# Patient Record
Sex: Male | Born: 1935 | Race: White | Hispanic: No | Marital: Married | Smoking: Former smoker
Health system: Southern US, Community
[De-identification: ages and names within clinical notes are randomized; demographics above are authoritative.]

## PROBLEM LIST (undated history)

## (undated) DIAGNOSIS — I739 Peripheral vascular disease, unspecified: Secondary | ICD-10-CM

## (undated) DIAGNOSIS — I495 Sick sinus syndrome: Secondary | ICD-10-CM

## (undated) DIAGNOSIS — I10 Essential (primary) hypertension: Secondary | ICD-10-CM

## (undated) DIAGNOSIS — I459 Conduction disorder, unspecified: Secondary | ICD-10-CM

## (undated) DIAGNOSIS — J9690 Respiratory failure, unspecified, unspecified whether with hypoxia or hypercapnia: Secondary | ICD-10-CM

## (undated) DIAGNOSIS — I482 Chronic atrial fibrillation, unspecified: Secondary | ICD-10-CM

## (undated) DIAGNOSIS — K279 Peptic ulcer, site unspecified, unspecified as acute or chronic, without hemorrhage or perforation: Secondary | ICD-10-CM

## (undated) DIAGNOSIS — N189 Chronic kidney disease, unspecified: Secondary | ICD-10-CM

## (undated) DIAGNOSIS — I2510 Atherosclerotic heart disease of native coronary artery without angina pectoris: Secondary | ICD-10-CM

## (undated) DIAGNOSIS — M1990 Unspecified osteoarthritis, unspecified site: Secondary | ICD-10-CM

## (undated) DIAGNOSIS — E785 Hyperlipidemia, unspecified: Secondary | ICD-10-CM

## (undated) HISTORY — PX: INSERT / REPLACE / REMOVE PACEMAKER: SUR710

## (undated) HISTORY — DX: Essential (primary) hypertension: I10

## (undated) HISTORY — DX: Chronic kidney disease, unspecified: N18.9

## (undated) HISTORY — DX: Peripheral vascular disease, unspecified: I73.9

## (undated) HISTORY — DX: Conduction disorder, unspecified: I45.9

## (undated) HISTORY — DX: Sick sinus syndrome: I49.5

## (undated) HISTORY — DX: Atherosclerotic heart disease of native coronary artery without angina pectoris: I25.10

## (undated) HISTORY — DX: Respiratory failure, unspecified, unspecified whether with hypoxia or hypercapnia: J96.90

## (undated) HISTORY — DX: Hyperlipidemia, unspecified: E78.5

## (undated) HISTORY — PX: CHOLECYSTECTOMY: SHX55

## (undated) HISTORY — DX: Unspecified osteoarthritis, unspecified site: M19.90

## (undated) HISTORY — DX: Chronic atrial fibrillation, unspecified: I48.20

## (undated) HISTORY — DX: Peptic ulcer, site unspecified, unspecified as acute or chronic, without hemorrhage or perforation: K27.9

---

## 2002-07-25 ENCOUNTER — Emergency Department (HOSPITAL_COMMUNITY): Admission: EM | Admit: 2002-07-25 | Discharge: 2002-07-25 | Payer: Self-pay | Admitting: Internal Medicine

## 2002-07-25 ENCOUNTER — Encounter: Payer: Self-pay | Admitting: Internal Medicine

## 2002-08-01 ENCOUNTER — Encounter: Payer: Self-pay | Admitting: Internal Medicine

## 2002-08-01 ENCOUNTER — Ambulatory Visit (HOSPITAL_COMMUNITY): Admission: RE | Admit: 2002-08-01 | Discharge: 2002-08-01 | Payer: Self-pay | Admitting: Internal Medicine

## 2002-08-03 ENCOUNTER — Inpatient Hospital Stay (HOSPITAL_COMMUNITY): Admission: EM | Admit: 2002-08-03 | Discharge: 2002-08-06 | Payer: Self-pay | Admitting: Internal Medicine

## 2002-08-03 ENCOUNTER — Encounter: Payer: Self-pay | Admitting: Internal Medicine

## 2002-08-03 ENCOUNTER — Encounter: Payer: Self-pay | Admitting: *Deleted

## 2002-08-04 ENCOUNTER — Encounter: Payer: Self-pay | Admitting: Internal Medicine

## 2002-09-03 ENCOUNTER — Observation Stay (HOSPITAL_COMMUNITY): Admission: RE | Admit: 2002-09-03 | Discharge: 2002-09-04 | Payer: Self-pay | Admitting: General Surgery

## 2002-09-03 ENCOUNTER — Encounter: Payer: Self-pay | Admitting: General Surgery

## 2003-05-26 ENCOUNTER — Ambulatory Visit: Admission: RE | Admit: 2003-05-26 | Discharge: 2003-05-26 | Payer: Self-pay | Admitting: Orthopedic Surgery

## 2003-05-26 ENCOUNTER — Encounter: Payer: Self-pay | Admitting: Orthopedic Surgery

## 2003-11-18 ENCOUNTER — Ambulatory Visit (HOSPITAL_COMMUNITY): Admission: RE | Admit: 2003-11-18 | Discharge: 2003-11-18 | Payer: Self-pay | Admitting: Family Medicine

## 2004-06-10 ENCOUNTER — Inpatient Hospital Stay (HOSPITAL_COMMUNITY): Admission: EM | Admit: 2004-06-10 | Discharge: 2004-06-18 | Payer: Self-pay | Admitting: Emergency Medicine

## 2004-06-15 ENCOUNTER — Encounter: Payer: Self-pay | Admitting: Cardiology

## 2004-06-28 ENCOUNTER — Ambulatory Visit (HOSPITAL_COMMUNITY): Admission: RE | Admit: 2004-06-28 | Discharge: 2004-06-28 | Payer: Self-pay | Admitting: Internal Medicine

## 2004-07-20 ENCOUNTER — Ambulatory Visit (HOSPITAL_COMMUNITY): Admission: RE | Admit: 2004-07-20 | Discharge: 2004-07-20 | Payer: Self-pay | Admitting: Internal Medicine

## 2004-09-02 ENCOUNTER — Ambulatory Visit: Payer: Self-pay | Admitting: Cardiology

## 2004-12-10 ENCOUNTER — Ambulatory Visit: Payer: Self-pay | Admitting: Cardiology

## 2005-06-03 ENCOUNTER — Ambulatory Visit: Payer: Self-pay | Admitting: Cardiology

## 2005-12-27 ENCOUNTER — Ambulatory Visit: Payer: Self-pay | Admitting: Cardiology

## 2006-04-13 ENCOUNTER — Ambulatory Visit (HOSPITAL_COMMUNITY): Admission: RE | Admit: 2006-04-13 | Discharge: 2006-04-13 | Payer: Self-pay | Admitting: Internal Medicine

## 2006-06-19 ENCOUNTER — Ambulatory Visit: Payer: Self-pay | Admitting: Cardiology

## 2007-06-16 ENCOUNTER — Emergency Department (HOSPITAL_COMMUNITY): Admission: EM | Admit: 2007-06-16 | Discharge: 2007-06-16 | Payer: Self-pay | Admitting: Emergency Medicine

## 2007-06-19 ENCOUNTER — Ambulatory Visit: Payer: Self-pay | Admitting: Cardiology

## 2007-06-19 LAB — CONVERTED CEMR LAB
INR: 1.2 — ABNORMAL HIGH (ref 0.8–1.0)
Prothrombin Time: 13.3 s (ref 10.9–13.3)

## 2007-06-25 ENCOUNTER — Encounter: Payer: Self-pay | Admitting: Cardiology

## 2007-06-25 ENCOUNTER — Ambulatory Visit: Payer: Self-pay

## 2007-07-19 ENCOUNTER — Ambulatory Visit: Payer: Self-pay | Admitting: Emergency Medicine

## 2007-07-19 ENCOUNTER — Observation Stay (HOSPITAL_COMMUNITY): Admission: EM | Admit: 2007-07-19 | Discharge: 2007-07-20 | Payer: Self-pay | Admitting: Emergency Medicine

## 2007-07-19 ENCOUNTER — Ambulatory Visit: Payer: Self-pay | Admitting: Internal Medicine

## 2007-07-31 ENCOUNTER — Ambulatory Visit: Payer: Self-pay | Admitting: Cardiology

## 2007-07-31 LAB — CONVERTED CEMR LAB
BUN: 36 mg/dL — ABNORMAL HIGH (ref 6–23)
Calcium: 9.7 mg/dL (ref 8.4–10.5)
GFR calc Af Amer: 51 mL/min
GFR calc non Af Amer: 42 mL/min
Glucose, Bld: 316 mg/dL — ABNORMAL HIGH (ref 70–99)
Potassium: 5 meq/L (ref 3.5–5.1)

## 2007-08-21 ENCOUNTER — Ambulatory Visit: Payer: Self-pay | Admitting: Emergency Medicine

## 2007-09-18 ENCOUNTER — Ambulatory Visit: Payer: Self-pay | Admitting: Cardiology

## 2007-10-04 DIAGNOSIS — I2510 Atherosclerotic heart disease of native coronary artery without angina pectoris: Secondary | ICD-10-CM

## 2007-10-04 HISTORY — DX: Atherosclerotic heart disease of native coronary artery without angina pectoris: I25.10

## 2007-12-20 ENCOUNTER — Ambulatory Visit (HOSPITAL_COMMUNITY): Admission: RE | Admit: 2007-12-20 | Discharge: 2007-12-20 | Payer: Self-pay | Admitting: Ophthalmology

## 2007-12-21 ENCOUNTER — Ambulatory Visit: Payer: Self-pay | Admitting: Cardiology

## 2007-12-21 ENCOUNTER — Encounter (INDEPENDENT_AMBULATORY_CARE_PROVIDER_SITE_OTHER): Payer: Self-pay | Admitting: Family Medicine

## 2007-12-21 ENCOUNTER — Inpatient Hospital Stay (HOSPITAL_COMMUNITY): Admission: EM | Admit: 2007-12-21 | Discharge: 2007-12-28 | Payer: Self-pay | Admitting: Emergency Medicine

## 2007-12-25 ENCOUNTER — Ambulatory Visit: Payer: Self-pay | Admitting: Cardiology

## 2007-12-26 ENCOUNTER — Other Ambulatory Visit: Payer: Self-pay | Admitting: Cardiology

## 2007-12-27 ENCOUNTER — Other Ambulatory Visit: Payer: Self-pay | Admitting: Internal Medicine

## 2007-12-28 ENCOUNTER — Other Ambulatory Visit: Payer: Self-pay | Admitting: Cardiology

## 2008-01-17 ENCOUNTER — Ambulatory Visit: Payer: Self-pay | Admitting: Cardiology

## 2008-01-17 ENCOUNTER — Inpatient Hospital Stay (HOSPITAL_COMMUNITY): Admission: EM | Admit: 2008-01-17 | Discharge: 2008-01-19 | Payer: Self-pay | Admitting: Emergency Medicine

## 2008-01-17 LAB — CONVERTED CEMR LAB
CO2: 26 meq/L (ref 19–32)
Calcium: 8.8 mg/dL (ref 8.4–10.5)
Chloride: 97 meq/L (ref 96–112)
Glucose, Bld: 698 mg/dL (ref 70–99)
Hemoglobin: 8.1 g/dL — ABNORMAL LOW (ref 13.0–17.0)
Lymphocytes Relative: 17.9 % (ref 12.0–46.0)
Monocytes Relative: 7.8 % (ref 3.0–12.0)
Neutro Abs: 5.5 10*3/uL (ref 1.4–7.7)
Neutrophils Relative %: 68.6 % (ref 43.0–77.0)
Potassium: 5 meq/L (ref 3.5–5.1)
RBC: 2.54 M/uL — ABNORMAL LOW (ref 4.22–5.81)
RDW: 15.3 % — ABNORMAL HIGH (ref 11.5–14.6)
Sodium: 130 meq/L — ABNORMAL LOW (ref 135–145)

## 2008-01-18 ENCOUNTER — Ambulatory Visit: Payer: Self-pay | Admitting: Gastroenterology

## 2008-02-15 ENCOUNTER — Inpatient Hospital Stay (HOSPITAL_COMMUNITY): Admission: EM | Admit: 2008-02-15 | Discharge: 2008-02-18 | Payer: Self-pay | Admitting: Emergency Medicine

## 2008-02-15 ENCOUNTER — Ambulatory Visit: Payer: Self-pay | Admitting: Internal Medicine

## 2008-02-21 ENCOUNTER — Ambulatory Visit: Payer: Self-pay | Admitting: Cardiology

## 2008-02-27 ENCOUNTER — Ambulatory Visit: Payer: Self-pay | Admitting: Gastroenterology

## 2008-06-16 ENCOUNTER — Ambulatory Visit: Payer: Self-pay | Admitting: Cardiology

## 2008-06-18 ENCOUNTER — Ambulatory Visit (HOSPITAL_COMMUNITY): Admission: RE | Admit: 2008-06-18 | Discharge: 2008-06-18 | Payer: Self-pay | Admitting: Cardiology

## 2008-06-20 ENCOUNTER — Ambulatory Visit: Payer: Self-pay | Admitting: Cardiology

## 2008-06-25 ENCOUNTER — Ambulatory Visit: Payer: Self-pay | Admitting: Gastroenterology

## 2008-08-22 ENCOUNTER — Ambulatory Visit: Payer: Self-pay | Admitting: Cardiology

## 2008-09-18 IMAGING — CR DG CHEST 2V
2 series · 2 of 2 positions shown · non-contrast
Comparison: 02/15/2008

CLINICAL DATA: 79-year-old.  Shortness of breath, COPD.

CHEST - 2 VIEW

[view not recorded (1 of 2)]
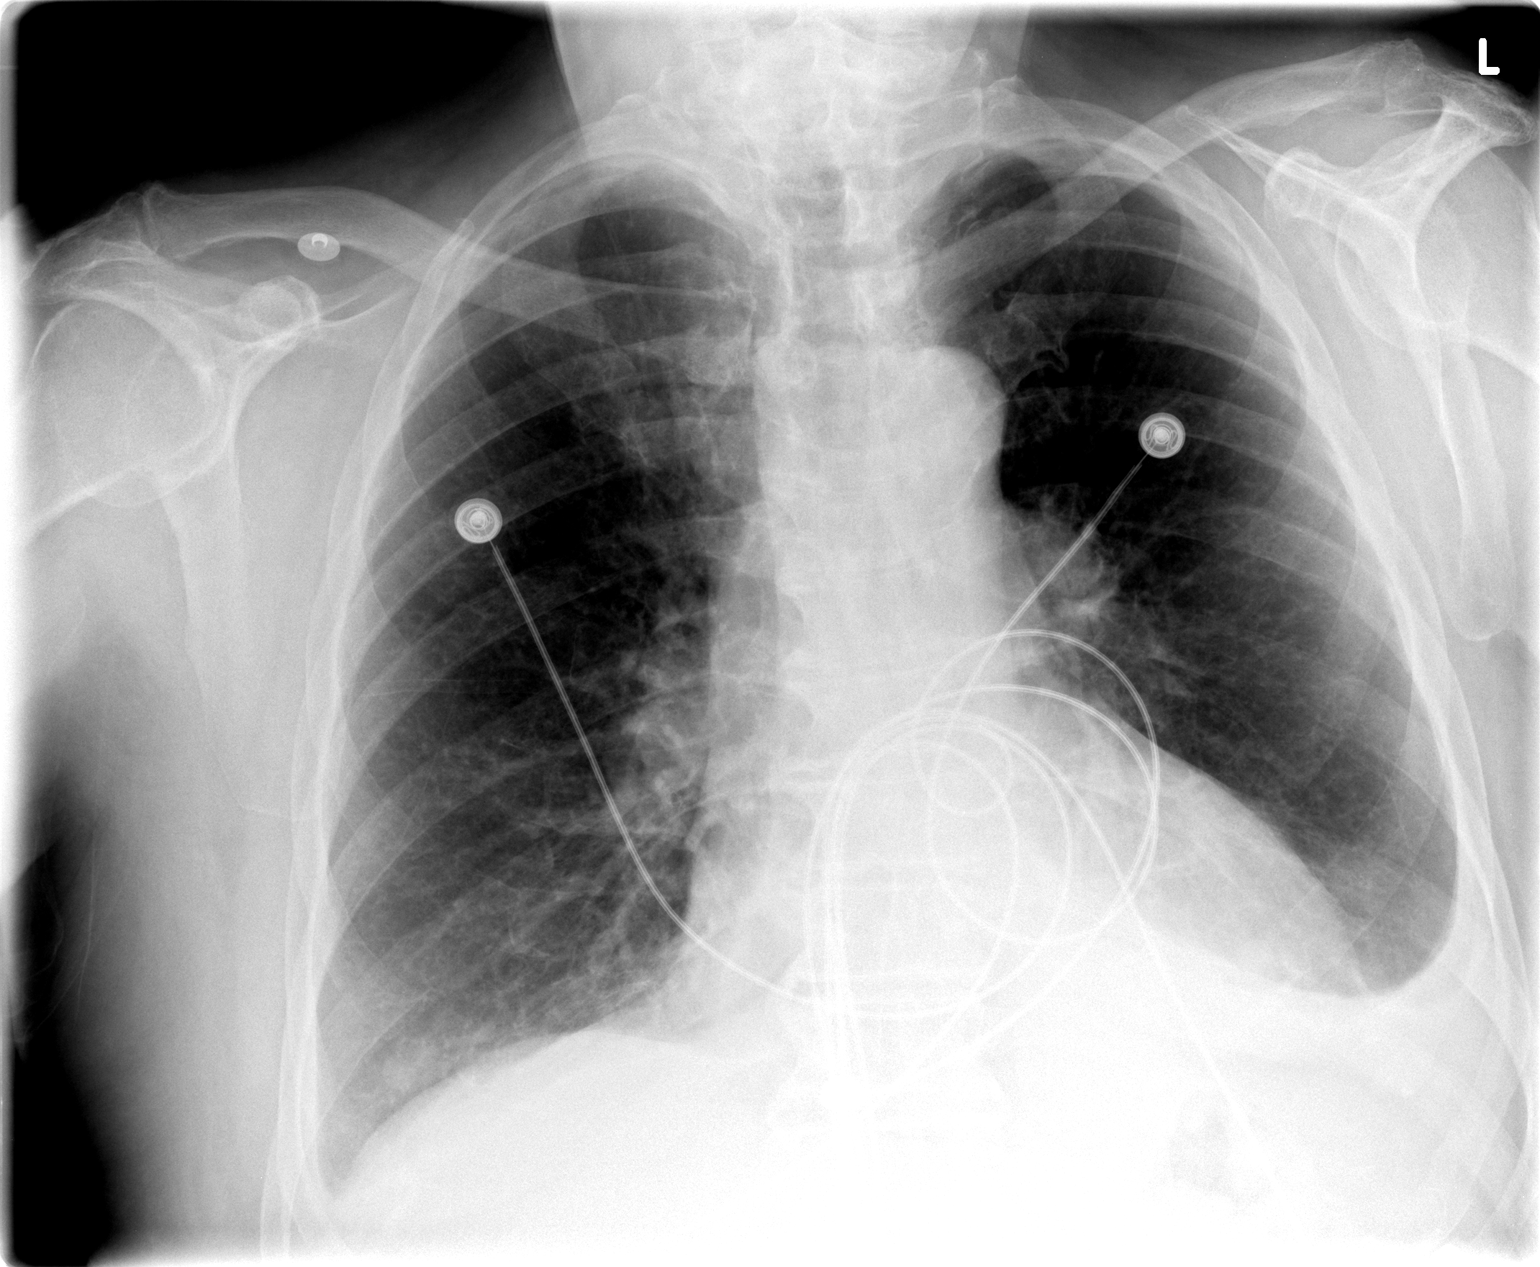

[view not recorded (2 of 2)]
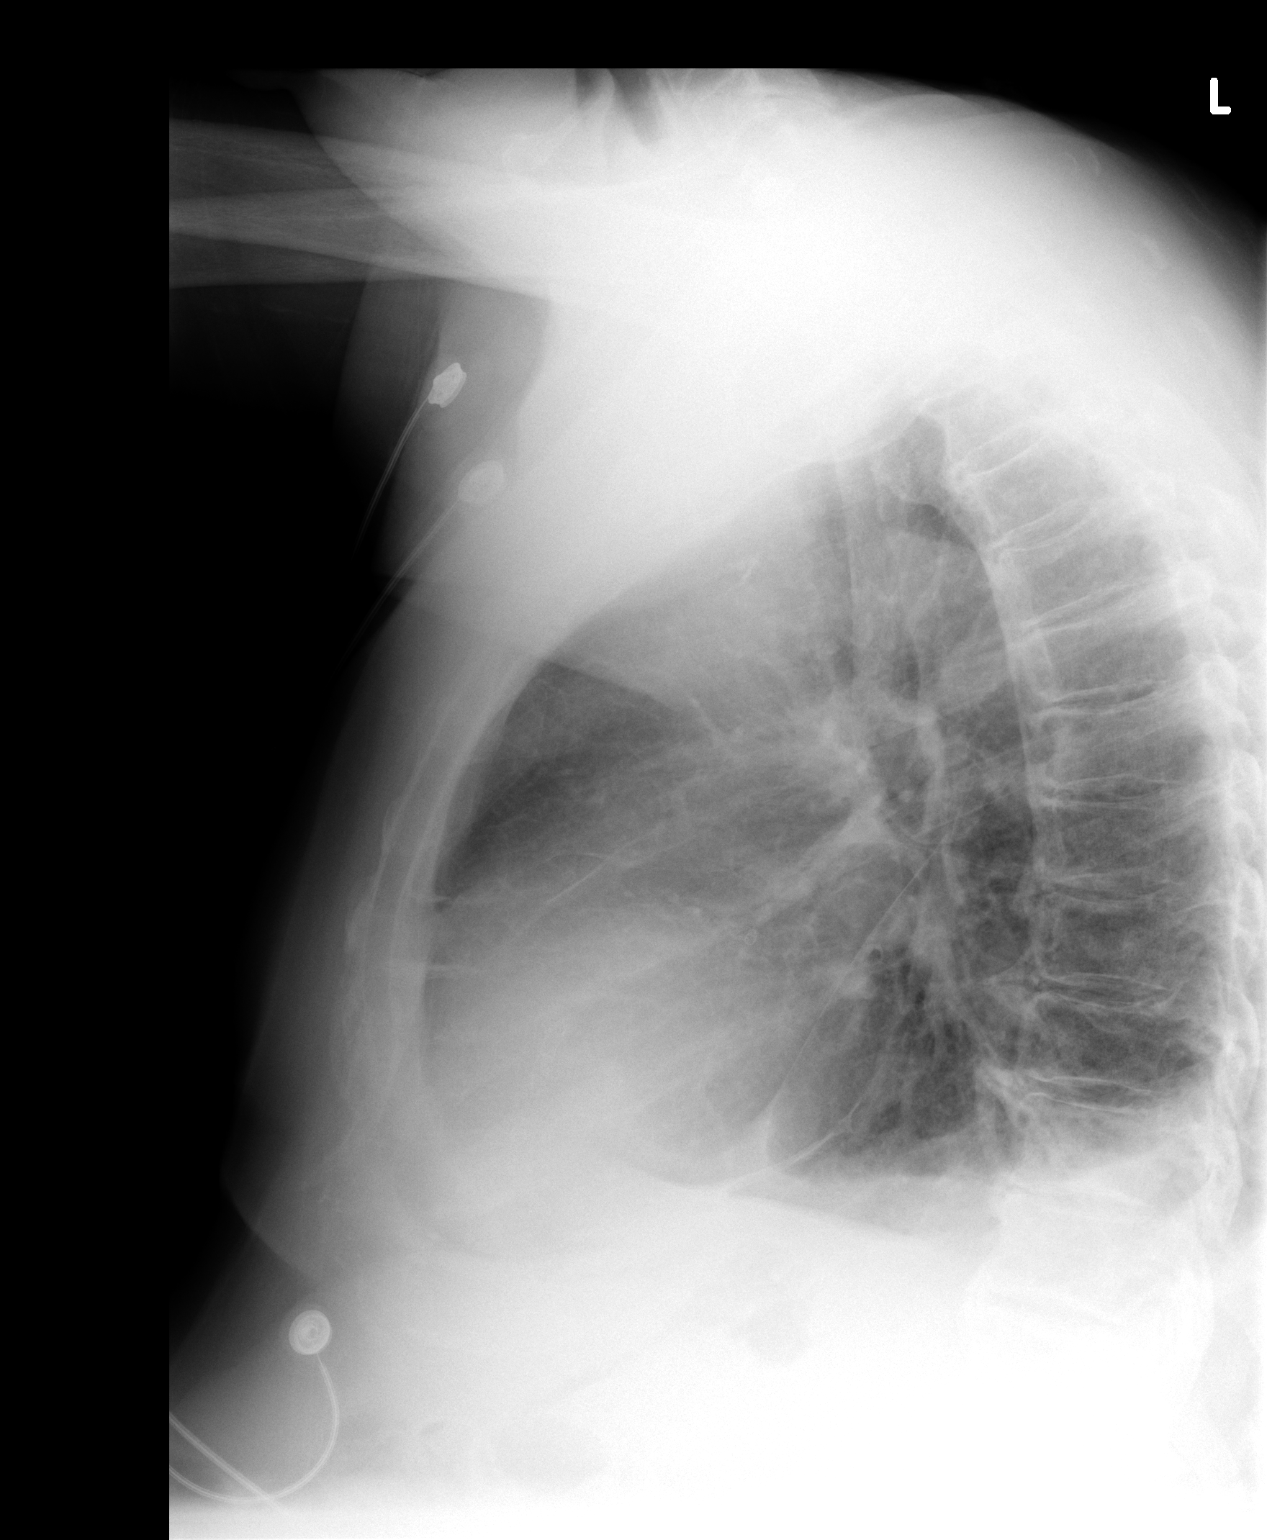

[2 of 2 positions shown; findings below may reference images not displayed]

FINDINGS: Heart is enlarged.  There is a left-sided pleural
effusion, probably unchanged.  Minimal right base atelectasis is
identified.  There has been some improvement in interstitial edema
since prior study.
IMPRESSION: Improved interstitial edema.  Persistent left effusion.

## 2008-10-22 ENCOUNTER — Ambulatory Visit: Payer: Self-pay | Admitting: Cardiology

## 2008-12-17 ENCOUNTER — Ambulatory Visit: Payer: Self-pay | Admitting: Gastroenterology

## 2008-12-17 DIAGNOSIS — D649 Anemia, unspecified: Secondary | ICD-10-CM

## 2008-12-17 DIAGNOSIS — Z8719 Personal history of other diseases of the digestive system: Secondary | ICD-10-CM

## 2008-12-18 ENCOUNTER — Encounter: Payer: Self-pay | Admitting: Gastroenterology

## 2008-12-19 LAB — CONVERTED CEMR LAB
HCT: 33.6 % — ABNORMAL LOW (ref 39.0–52.0)
Hemoglobin: 10.4 g/dL — ABNORMAL LOW (ref 13.0–17.0)

## 2009-01-10 ENCOUNTER — Encounter: Payer: Self-pay | Admitting: Cardiology

## 2009-01-20 DIAGNOSIS — E119 Type 2 diabetes mellitus without complications: Secondary | ICD-10-CM | POA: Insufficient documentation

## 2009-01-20 DIAGNOSIS — I10 Essential (primary) hypertension: Secondary | ICD-10-CM | POA: Insufficient documentation

## 2009-01-20 DIAGNOSIS — E785 Hyperlipidemia, unspecified: Secondary | ICD-10-CM

## 2009-01-20 DIAGNOSIS — I4891 Unspecified atrial fibrillation: Secondary | ICD-10-CM

## 2009-01-21 ENCOUNTER — Ambulatory Visit: Payer: Self-pay | Admitting: Cardiology

## 2009-01-29 ENCOUNTER — Ambulatory Visit: Payer: Self-pay | Admitting: Internal Medicine

## 2009-01-29 ENCOUNTER — Encounter: Payer: Self-pay | Admitting: Cardiology

## 2009-01-29 ENCOUNTER — Ambulatory Visit: Payer: Self-pay | Admitting: Pulmonary Disease

## 2009-01-29 ENCOUNTER — Inpatient Hospital Stay (HOSPITAL_COMMUNITY): Admission: EM | Admit: 2009-01-29 | Discharge: 2009-02-13 | Payer: Self-pay | Admitting: Emergency Medicine

## 2009-01-30 ENCOUNTER — Encounter: Payer: Self-pay | Admitting: Internal Medicine

## 2009-01-30 ENCOUNTER — Encounter: Payer: Self-pay | Admitting: Cardiology

## 2009-02-02 ENCOUNTER — Encounter: Payer: Self-pay | Admitting: Cardiology

## 2009-02-04 ENCOUNTER — Encounter: Payer: Self-pay | Admitting: Cardiology

## 2009-02-05 ENCOUNTER — Encounter: Payer: Self-pay | Admitting: Cardiology

## 2009-02-13 ENCOUNTER — Inpatient Hospital Stay: Admission: RE | Admit: 2009-02-13 | Discharge: 2009-02-18 | Payer: Self-pay | Admitting: Internal Medicine

## 2009-02-18 ENCOUNTER — Ambulatory Visit: Payer: Self-pay | Admitting: Cardiology

## 2009-02-18 ENCOUNTER — Ambulatory Visit: Payer: Self-pay | Admitting: Internal Medicine

## 2009-02-18 ENCOUNTER — Inpatient Hospital Stay (HOSPITAL_COMMUNITY): Admission: EM | Admit: 2009-02-18 | Discharge: 2009-02-23 | Payer: Self-pay | Admitting: Emergency Medicine

## 2009-02-19 ENCOUNTER — Ambulatory Visit: Payer: Self-pay | Admitting: Internal Medicine

## 2009-02-24 ENCOUNTER — Telehealth (INDEPENDENT_AMBULATORY_CARE_PROVIDER_SITE_OTHER): Payer: Self-pay | Admitting: *Deleted

## 2009-02-27 ENCOUNTER — Encounter: Payer: Self-pay | Admitting: Internal Medicine

## 2009-03-04 ENCOUNTER — Ambulatory Visit: Payer: Self-pay | Admitting: Cardiology

## 2009-03-05 ENCOUNTER — Encounter: Payer: Self-pay | Admitting: Cardiology

## 2009-03-09 ENCOUNTER — Inpatient Hospital Stay (HOSPITAL_COMMUNITY): Admission: EM | Admit: 2009-03-09 | Discharge: 2009-03-13 | Payer: Self-pay | Admitting: Emergency Medicine

## 2009-03-09 ENCOUNTER — Ambulatory Visit: Payer: Self-pay | Admitting: Internal Medicine

## 2009-03-10 ENCOUNTER — Encounter: Payer: Self-pay | Admitting: Internal Medicine

## 2009-03-11 ENCOUNTER — Encounter: Payer: Self-pay | Admitting: Internal Medicine

## 2009-03-12 ENCOUNTER — Encounter: Payer: Self-pay | Admitting: Internal Medicine

## 2009-03-13 ENCOUNTER — Encounter: Payer: Self-pay | Admitting: Cardiology

## 2009-03-17 ENCOUNTER — Telehealth: Payer: Self-pay | Admitting: Cardiology

## 2009-03-25 ENCOUNTER — Encounter: Payer: Self-pay | Admitting: Cardiology

## 2009-03-26 ENCOUNTER — Encounter: Payer: Self-pay | Admitting: Internal Medicine

## 2009-03-26 ENCOUNTER — Ambulatory Visit: Payer: Self-pay

## 2009-04-08 ENCOUNTER — Ambulatory Visit: Payer: Self-pay | Admitting: Cardiology

## 2009-07-01 ENCOUNTER — Ambulatory Visit: Payer: Self-pay | Admitting: Internal Medicine

## 2009-07-01 DIAGNOSIS — Z950 Presence of cardiac pacemaker: Secondary | ICD-10-CM | POA: Insufficient documentation

## 2009-07-15 ENCOUNTER — Ambulatory Visit: Payer: Self-pay | Admitting: Cardiology

## 2009-08-12 ENCOUNTER — Ambulatory Visit (HOSPITAL_COMMUNITY): Admission: RE | Admit: 2009-08-12 | Discharge: 2009-08-12 | Payer: Self-pay | Admitting: Internal Medicine

## 2009-10-09 IMAGING — CT CT HEAD W/O CM
1 series · 15 of 28 positions shown, 19 images · non-contrast
Comparison: 02/05/2009

CLINICAL DATA: Weakness.  Diabetes.  Hypertension.

CT HEAD WITHOUT CONTRAST
TECHNIQUE: Contiguous axial images were obtained from the base of
the skull through the vertex without contrast.

[Series 2: brain · axial · 0.47mm/px · z∈[+186,+320]mm · 15 of 28 slices shown, 19 images]
[im 2/28  brain]
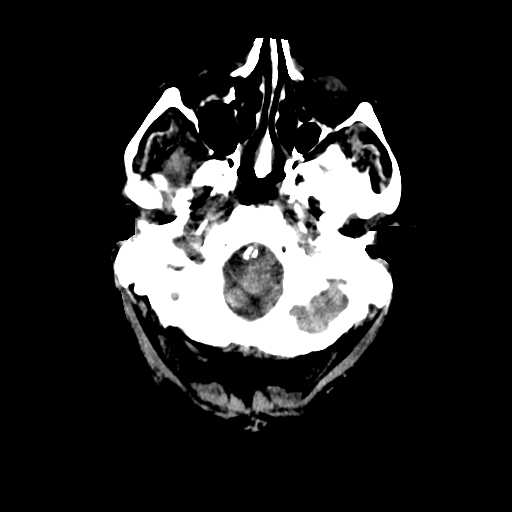
[im 2/28  bone]
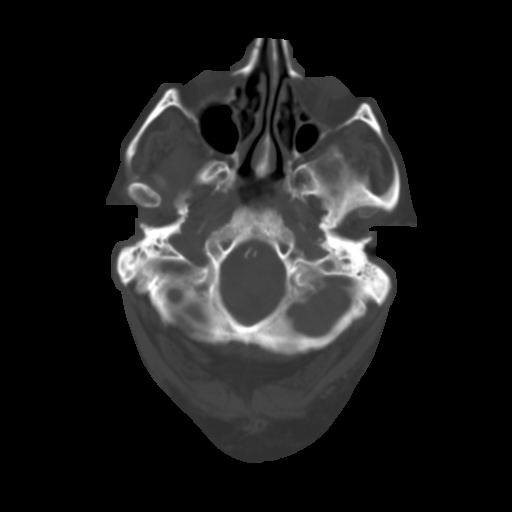
[im 4/28  brain]
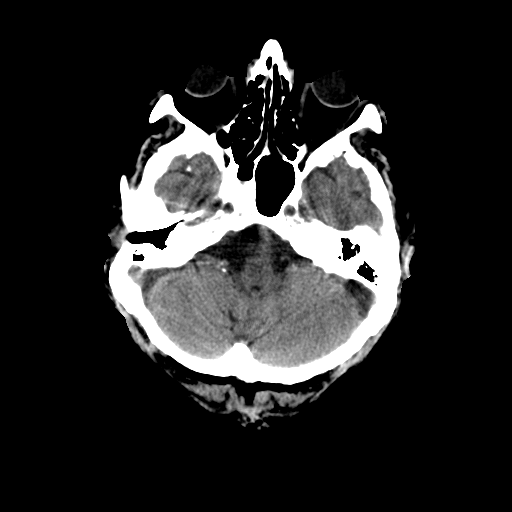
[im 6/28  brain]
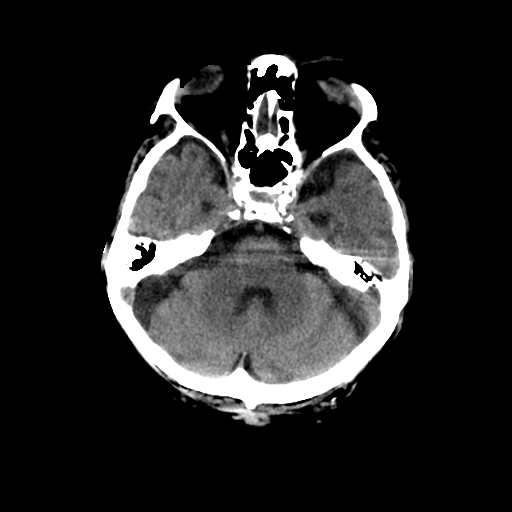
[im 8/28  brain]
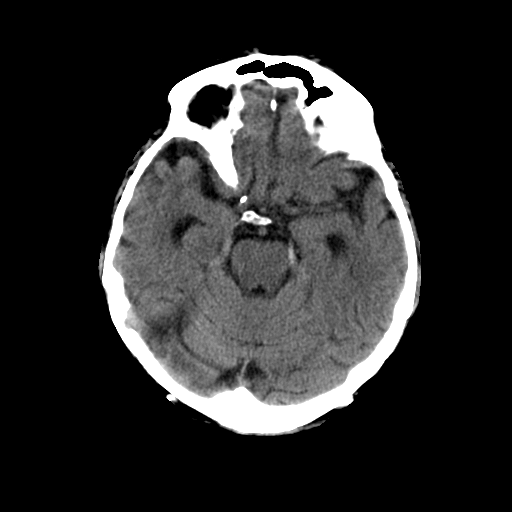
[im 9/28  brain]
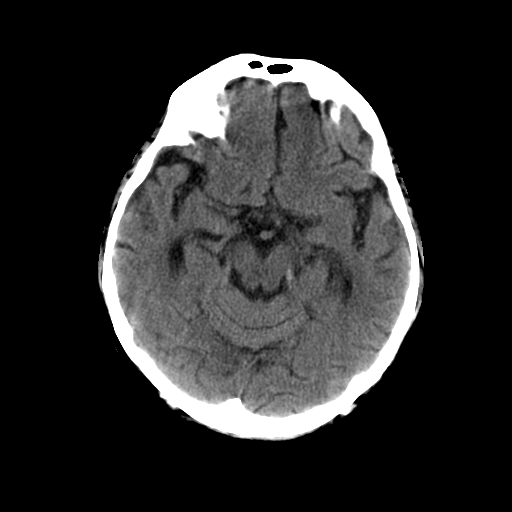
[im 9/28  bone]
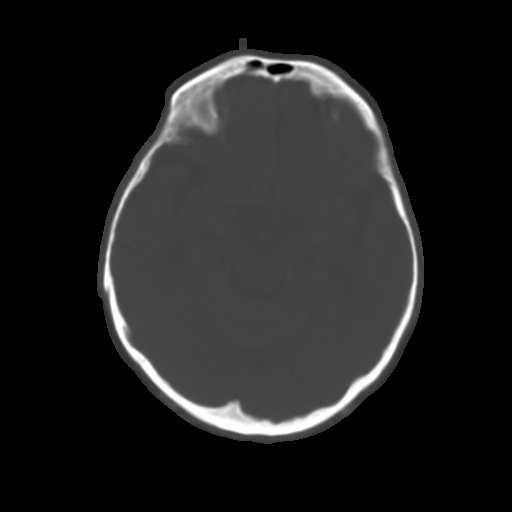
[im 11/28  brain]
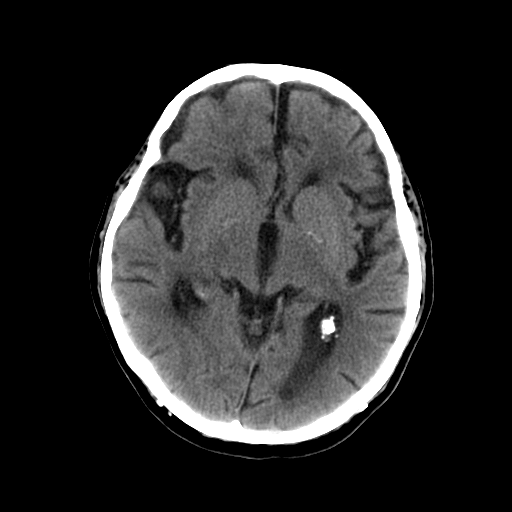
[im 13/28  brain]
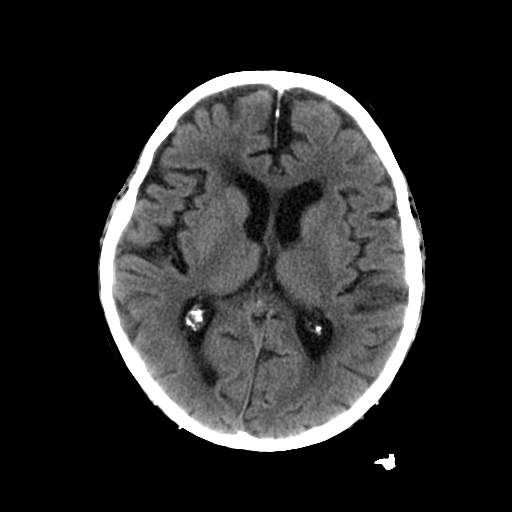
[im 15/28  brain]
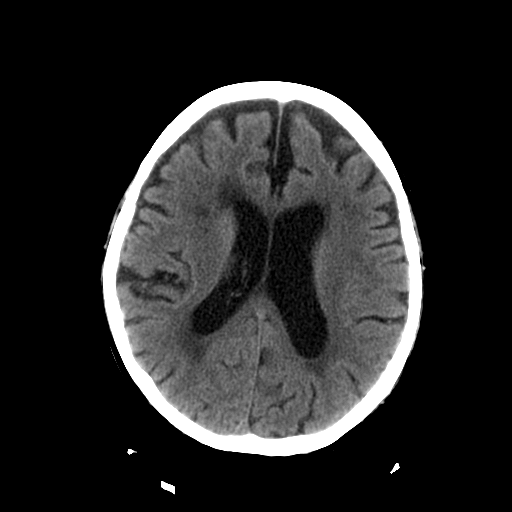
[im 16/28  brain]
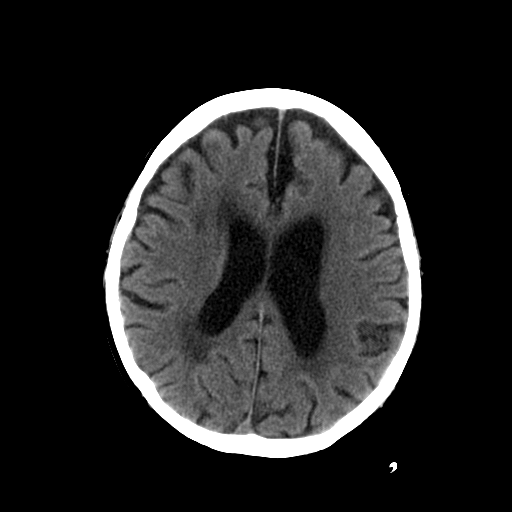
[im 16/28  bone]
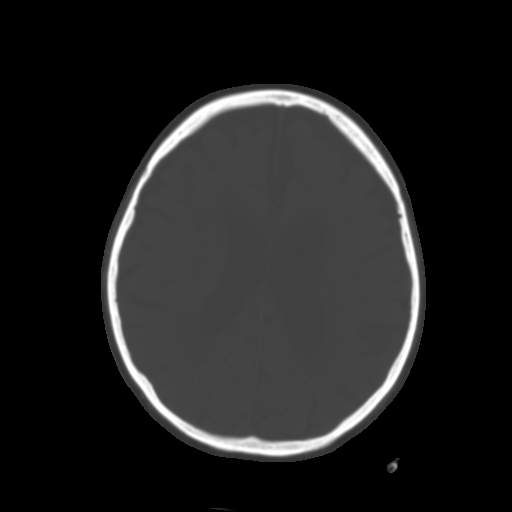
[im 18/28  brain]
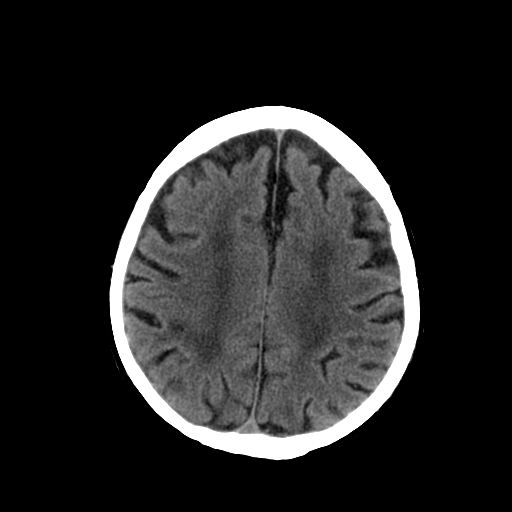
[im 20/28  brain]
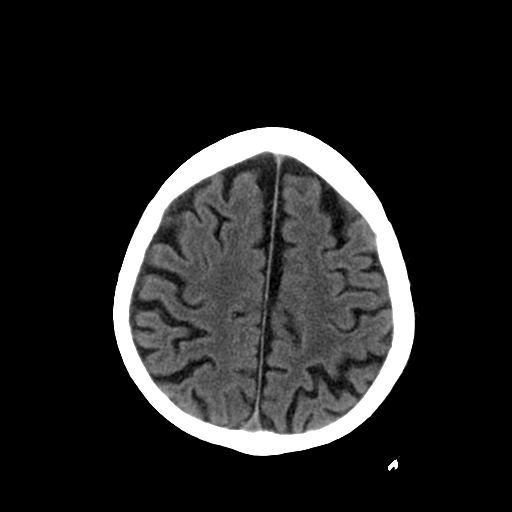
[im 21/28  brain]
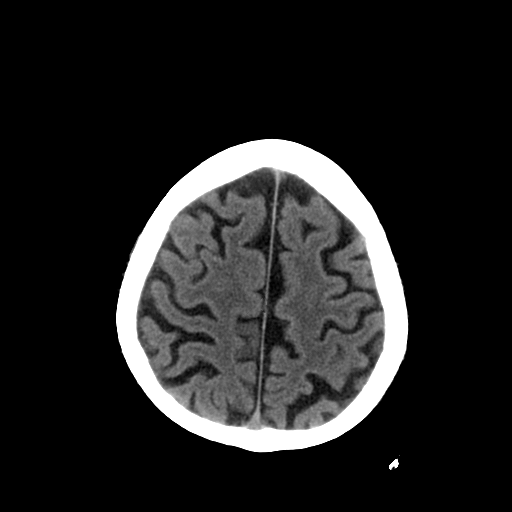
[im 23/28  brain]
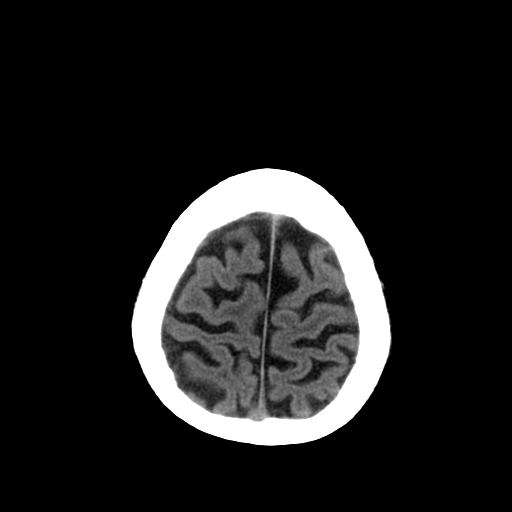
[im 23/28  bone]
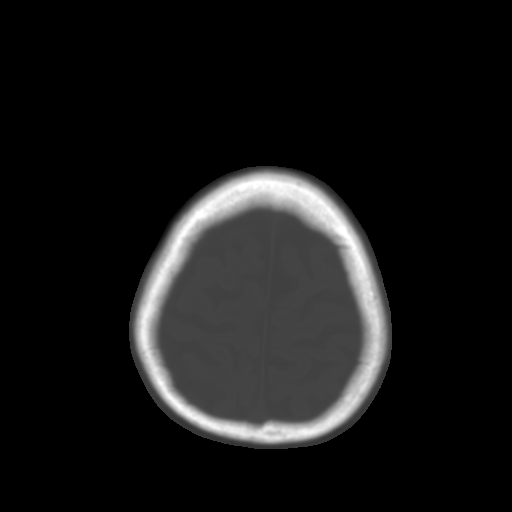
[im 25/28  brain]
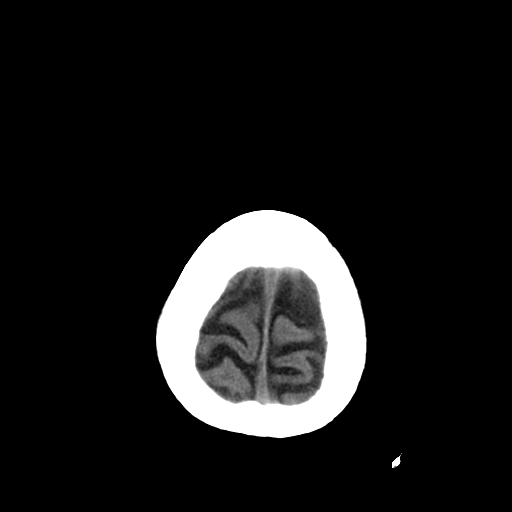
[im 27/28  brain]
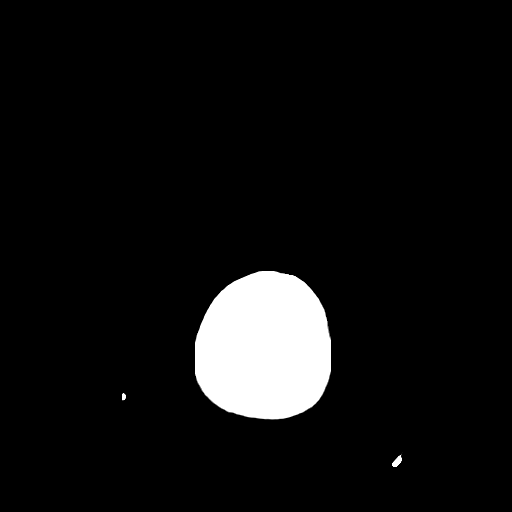

[15 of 28 positions shown; findings below may reference images not displayed]

FINDINGS: Intracranial atherosclerotic calcification noted.

The brain stem, cerebellum, cerebral peduncles, thalami, basal
ganglia, basilar cisterns, and ventricular system appear
unremarkable.

Periventricular and corona radiata white matter hypodensities are
most compatible with chronic ischemic microvascular white matter
disease.

Confluent hypodensity in the right periventricular white matter may
represent a small chronic lacunar infarct, and is less distinctly
seen than on the prior exam.

No intracranial hemorrhage, mass lesion, or acute infarction is
identified.

There is an air fluid level in the right sphenoid sinus which is
stable and compatible with sphenoid sinusitis.
IMPRESSION: 1.  Right sphenoid sinusitis.
2.  Atherosclerosis.
3. Periventricular and corona radiata white matter hypodensities
are most compatible with chronic ischemic microvascular white
matter disease.
4.  No acute findings intracranially. Please note that acute CVA
can be occult on CT scan.

## 2009-10-09 IMAGING — CR DG CHEST 1V PORT
1 series · 1 of 1 positions shown · non-contrast
Comparison: 02/08/2009.

CLINICAL DATA: Short of breath.

PORTABLE CHEST - 1 VIEW

[AP]
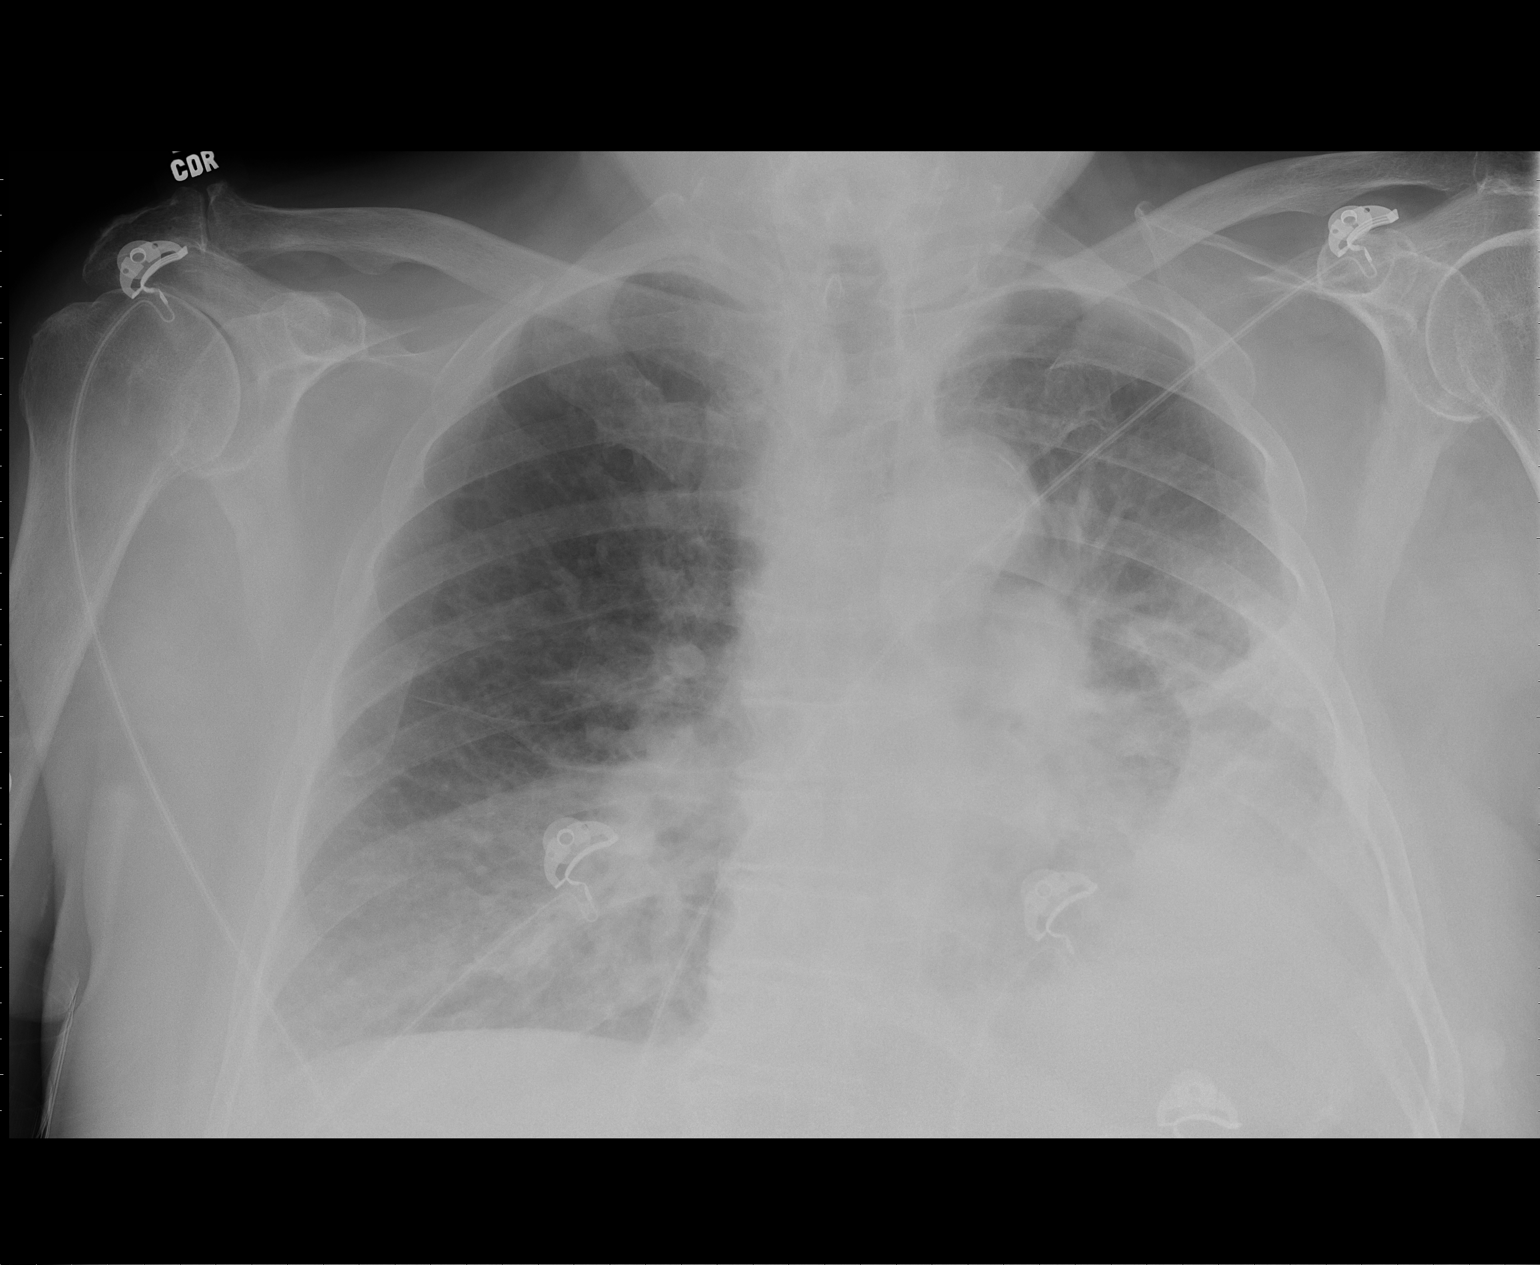

[1 of 1 positions shown; findings below may reference images not displayed]

FINDINGS: Left pleural effusion is unchanged.  There is a small
right effusion.  There is pulmonary vascular congestion and mild
edema, slightly increased from the prior study.  There is bibasilar
atelectasis related to the effusions.
IMPRESSION: Congestive heart failure with edema and effusions.

## 2009-11-19 ENCOUNTER — Ambulatory Visit (HOSPITAL_COMMUNITY): Admission: RE | Admit: 2009-11-19 | Discharge: 2009-11-19 | Payer: Self-pay | Admitting: Nephrology

## 2010-01-27 ENCOUNTER — Ambulatory Visit: Payer: Self-pay | Admitting: Cardiology

## 2010-01-27 DIAGNOSIS — I2510 Atherosclerotic heart disease of native coronary artery without angina pectoris: Secondary | ICD-10-CM | POA: Insufficient documentation

## 2010-01-27 DIAGNOSIS — F17200 Nicotine dependence, unspecified, uncomplicated: Secondary | ICD-10-CM | POA: Insufficient documentation

## 2010-03-31 ENCOUNTER — Ambulatory Visit: Payer: Self-pay | Admitting: Internal Medicine

## 2010-09-17 ENCOUNTER — Ambulatory Visit: Payer: Self-pay | Admitting: Cardiology

## 2010-11-02 NOTE — Assessment & Plan Note (Signed)
Summary: 6/11 F/U PER CHECKOUT ON 07/01/09/TG   Visit Type:  Follow-up Primary Provider:  Dr. Sherwood Gambler  CC:  no cardiology complaints.  History of Present Illness: Mr. Goree returns today for PPM followup.  He is a pleasant 77 yo man with a h/o atrial fibrillation and bradycardia.  He is s/p PPM by Dr. 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  He has mild peripheral edema and continues to smoke cigarettes. He denies c/p.  He has class 2 dysnpea which is multi-factorial.  He denies peripheral edema.   Current Medications (verified): 1)  Furosemide 80 Mg Tabs (Furosemide) .Marland Kitchen.. 1 By Mouth Daily and As Needed 2)  Glyburide 5 Mg Tabs (Glyburide) .Marland Kitchen.. 1 By Mouth Bid 3)  Omeprazole 20 Mg Cpdr (Omeprazole) .Marland Kitchen.. 1 By Mouth Daily 4)  Isosorbide Mononitrate Cr 30 Mg Xr24h-Tab (Isosorbide Mononitrate) .... 2 By Mouth Daily 5)  Aspirin 81 Mg  Tabs (Aspirin) .Marland Kitchen.. 1 Podaily 6)  Zetia 10 Mg Tabs (Ezetimibe) .Marland Kitchen.. 1 By Mouth Daily 7)  Xopenex .... As Needed 8)  Spiriva .... Once Daily 9)  Amitriptyline Hcl 25 Mg Tabs (Amitriptyline Hcl) .... Take 1 Tablet By Mouth At Bedtime 10)  Uroxatral 10 Mg Xr24h-Tab (Alfuzosin Hcl) .... One By Mouth Daily 11)  Hydralazine Hcl 25 Mg Tabs (Hydralazine Hcl) .... One Half By Mouth Three Times A Day 12)  Nu-Iron 150 Mg Caps (Polysaccharide Iron Complex) .Marland Kitchen.. 1 By Mouth Daily 13)  Lorazepam 1 Mg Tabs (Lorazepam) .... 1/4 Tab At Bed Time 14)  Glyset 50 Mg Tabs (Miglitol) .Marland Kitchen.. 1 By Mouth Three Times A Day 15)  Nitrostat 0.4 Mg Subl (Nitroglycerin) .... As Needed  Allergies (verified): 1)  ! Sulfa 2)  ! * Statins 3)  Sulfamethoxazole (Sulfamethoxazole) 4)  * Albuterol  Past History:  Past Medical History: Last updated: 01/27/2010 Respiratory failure (secondary to pleural effusion, severe COPD, ongoing tobacco abuse, and congestive heart failure), heart block with sinus pauses (status post St. Jude pacemaker by Dr. Graciela Husbands), hospital-induced delirium, chronic renal  insufficiency, chronic atrial fibrillation (no Coumadin secondary to GI bleeding), diabetes mellitus, coronary artery disease (last catheterization in 2009 demonstrate a 3-vessel CAD).  He underwent stenting with a right coronary artery with a PROMUS drug-eluting stent.  He also had a stenting of his circumflex with drug-eluting stent previously), cardiomyopathy (EF 40%), degenerative joint disease, hypertension, hyperlipidemia, peptic ulcer disease, and peripheral vascular disease.    Past Surgical History: Last updated: 01/20/2009 Cholecystectomy  Review of Systems  The patient denies chest pain, syncope, dyspnea on exertion, and peripheral edema.    Vital Signs:  Patient profile:   77 year old male Weight:      189 pounds Pulse rate:   72 / minute BP sitting:   122 / 66  (right arm)  Vitals Entered By: Dreama Saa, CNA (March 31, 2010 8:50 AM)  Physical Exam  General:  Chronically ill-appearing Head:  normocephalic and atraumatic Neck:  Neck supple, no JVD. No masses, thyromegaly or abnormal cervical nodes. Chest Wall:  Well healed PPM incision. Lungs:  Decreased breath sounds bilaterally without wheezing Heart:  IRIR with normal S1 and S2.  PMI is not enlarged or laterally displaced. Abdomen:  Bowel sounds positive; abdomen soft and non-tender without masses, organomegaly, or hernias noted. No hepatosplenomegaly. Msk:  mild diffuse muscle wasting Pulses:  1+ bilaterally. Extremities:  2+upper pulses, diminished bilateral dorsalis pedis and posterior tibialis, no edema Neurologic:  Alert and oriented x 3.   PPM  Specifications Following MD:  Sherryl Manges, MD     PPM Vendor:  St Jude     PPM Model Number:  [id number]     PPM Serial Number:  [id number] PPM DOI:  03/11/2009     PPM Implanting MD:  Sherryl Manges, MD  Lead 1    Location: RV     DOI: 03/11/2009     Model #: [id number]     Serial #: [id number]     Status: active  Magnet Response Rate:  BOL 98.6 ERI  86.3    PPM  Follow Up Battery Voltage:  2.79 V     Battery Est. Longevity:  >89yrs     Pacer Dependent:  No     Right Ventricle  Amplitude: 12.0 mV, Impedance: 782 ohms, Threshold: 0.75 V at 0.4 msec  Episodes MS Episodes:  0     Percent Mode Switch:  0     Coumadin:  No Ventricular High Rate:  0     Ventricular Pacing:  31%  Parameters Mode:  VVI     Lower Rate Limit:  60     Upper Rate Limit:  110 Next Cardiology Appt Due:  03/04/2011 Tech Comments:  NORMAL DEVICE FUNCTION.  NO CHANGES MADE. ROV IN 12 MTHS W/GT [place].  Vella Kohler  March 31, 2010 9:05 AM MD Comments:  Agree with above.  Impression & Recommendations:  Problem # 1:  CARDIAC PACEMAKER IN SITU (ICD-V45.01) His device is working normally.  Will recheck in several months.  Problem # 2:  ATRIAL FIBRILLATION (ICD-427.31) His ventricular rate appears to be well controlled. His updated medication list for this problem includes:    Aspirin 81 Mg Tabs (Aspirin) .Marland Kitchen... 1 podaily  Problem # 3:  HYPERTENSION (ICD-401.9) His blood pressure is well controlled.  Continue meds and maintain a low sodium diet. His updated medication list for this problem includes:    Furosemide 80 Mg Tabs (Furosemide) .Marland Kitchen... 1 by mouth daily and as needed    Aspirin 81 Mg Tabs (Aspirin) .Marland Kitchen... 1 podaily    Hydralazine Hcl 25 Mg Tabs (Hydralazine hcl) ..... One half by mouth three times a day  Patient Instructions: 1)  Your physician recommends that you schedule a follow-up appointment in: 1 year 2)  Your physician recommends that you continue on your current medications as directed. Please refer to the Current Medication list given to you today.

## 2010-11-02 NOTE — Assessment & Plan Note (Signed)
Summary: [facility]   Visit Type:  Follow-up Primary Provider:  Dr. Sherwood Gambler  CC:  Atrial Fibrillation.  History of Present Illness: The patient presents for followup. Since I last saw him he has had no new cardiovascular complaints. He is chronically dyspneic but this is baseline. He continues to smoke cigarettes despite continued education. He is limited by back and hip pain that is chronic. He is not describing PND or orthopnea. He is not describing palpitations, presyncope or syncope. He has not been having any new chest pressure, neck or arm discomfort.  Current Medications (verified): 1)  Furosemide 80 Mg Tabs (Furosemide) .Marland Kitchen.. 1 By Mouth Daily and As Needed 2)  Glyburide 5 Mg Tabs (Glyburide) .Marland Kitchen.. 1 By Mouth Bid 3)  Omeprazole 20 Mg Cpdr (Omeprazole) .Marland Kitchen.. 1 By Mouth Daily 4)  Isosorbide Mononitrate Cr 30 Mg Xr24h-Tab (Isosorbide Mononitrate) .... 2 By Mouth Daily 5)  Aspirin 81 Mg  Tabs (Aspirin) .Marland Kitchen.. 1 Podaily 6)  Zetia 10 Mg Tabs (Ezetimibe) .Marland Kitchen.. 1 By Mouth Daily 7)  Xopenex .... As Needed 8)  Spiriva .... Once Daily 9)  Amitriptyline Hcl 25 Mg Tabs (Amitriptyline Hcl) .... Take 1 Tablet By Mouth At Bedtime 10)  Uroxatral 10 Mg Xr24h-Tab (Alfuzosin Hcl) .... One By Mouth Daily 11)  Hydralazine Hcl 25 Mg Tabs (Hydralazine Hcl) .... One Half By Mouth Three Times A Day 12)  Nu-Iron 150 Mg Caps (Polysaccharide Iron Complex) .Marland Kitchen.. 1 By Mouth Daily 13)  Lorazepam 1 Mg Tabs (Lorazepam) .... 1/4 Tab At Bed Time 14)  Glyset 50 Mg Tabs (Miglitol) .Marland Kitchen.. 1 By Mouth Three Times A Day 15)  Nitrostat 0.4 Mg Subl (Nitroglycerin) .... As Needed  Allergies (verified): 1)  ! Sulfa 2)  ! 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 Graciela Husbands),  hospital-induced delirium, chronic renal insufficiency, chronic atrial fibrillation (no Coumadin secondary to GI bleeding), diabetes mellitus, coronary artery disease (last catheterization in 2009 demonstrate a 3-vessel CAD).  He underwent stenting with a right coronary artery with a PROMUS drug-eluting stent.  He also had a stenting of his circumflex with drug-eluting stent previously), cardiomyopathy (EF 40%), degenerative joint disease, hypertension, hyperlipidemia, peptic ulcer disease, and peripheral vascular disease.    Past Surgical History: Reviewed history from 01/20/2009 and no changes required. Cholecystectomy  Review of Systems       Hip and back Otherwise as stated in the history of present illness negative for all other systems.  Vital Signs:  Patient profile:   77 year old male Height:      68 inches Weight:      192 pounds BMI:     29.30 Pulse rate:   76 / minute Resp:     20 per minute BP sitting:   92 / 50  (right arm)  Vitals Entered By: Marrion Coy, CNA (January 27, 2010 9:47 AM)  Physical Exam  General:  Chronically ill-appearing Head:  normocephalic and atraumatic Neck:  Neck supple, no JVD. No masses, thyromegaly or abnormal cervical nodes. Lungs:  Decreased breath sounds bilaterally without wheezing Abdomen:  Bowel sounds positive; abdomen soft and non-tender without masses, organomegaly, or hernias noted. No hepatosplenomegaly. Msk:  mild diffuse muscle wasting Extremities:  2+upper pulses, diminished bilateral dorsalis pedis and posterior tibialis, no edema Neurologic:  Alert  and oriented x 3. Skin:  Intact without lesions or rashes. Psych:  Normal affect.   EKG  Procedure date:  01/27/2010  Findings:      atrial fibrillation, interventricular conduction delay, poor anterior R-wave progression, no change  PPM Specifications Following MD:  Sherryl Manges, MD     PPM Vendor:  St Jude     PPM Model Number:  [id number]     PPM Serial Number:   [id number] PPM DOI:  03/11/2009     PPM Implanting MD:  Sherryl Manges, MD  Lead 1    Location: RV     DOI: 03/11/2009     Model #: [id number]     Serial #: [id number]     Status: active  Magnet Response Rate:  BOL 98.6 ERI  86.3    PPM Follow Up Pacer Dependent:  No      Episodes Coumadin:  No  Parameters Mode:  VVI     Lower Rate Limit:  60     Upper Rate Limit:  110  Impression & Recommendations:  Problem # 1:  ATRIAL FIBRILLATION (ICD-427.31) He has tachybradycardia. He doesn't tolerate Coumadin. His pacemaker followup is up to date. I will not change his regimen. Orders: EKG w/ Interpretation (93000)  Problem # 2:  TOBACCO USER (ICD-305.1) He refuses to quit smoking though we have talked about it at every appointment.  Problem # 3:  CAD (ICD-414.00) He has no new symptoms. We will continue with medical management.  Patient Instructions: 1)  Your physician recommends that you schedule a follow-up appointment in: 6 months in [place] 2)  Your physician recommends that you continue on your current medications as directed. Please refer to the Current Medication list given to you today. 3)  You have been diagnosed with atrial fibrillation.  Atrial fibrillation is a condition in which one of the upper chambers of the heart has extra electrical cells causing it to beat very fast.  Please see the handout/brochure given to you today for further information.

## 2010-11-04 NOTE — Procedures (Signed)
Summary: pacer check/st. jude   Current Medications (verified): 1)  Furosemide 80 Mg Tabs (Furosemide) .Marland Kitchen.. 1 By Mouth Daily and As Needed 2)  Glyburide 5 Mg Tabs (Glyburide) .Marland Kitchen.. 1 By Mouth Bid 3)  Omeprazole 20 Mg Cpdr (Omeprazole) .Marland Kitchen.. 1 By Mouth Daily 4)  Isosorbide Mononitrate Cr 30 Mg Xr24h-Tab (Isosorbide Mononitrate) .... One By Mouth Two Times A Day 5)  Aspirin 81 Mg  Tabs (Aspirin) .Marland Kitchen.. 1 Podaily 6)  Zetia 10 Mg Tabs (Ezetimibe) .Marland Kitchen.. 1 By Mouth Daily 7)  Xopenex .... As Needed 8)  Spiriva .... Once Daily 9)  Uroxatral 10 Mg Xr24h-Tab (Alfuzosin Hcl) .... One By Mouth Daily 10)  Hydralazine Hcl 25 Mg Tabs (Hydralazine Hcl) .... One Half By Mouth Three Times A Day 11)  Nu-Iron 150 Mg Caps (Polysaccharide Iron Complex) .Marland Kitchen.. 1 By Mouth Daily 12)  Glyset 50 Mg Tabs (Miglitol) .Marland Kitchen.. 1 By Mouth Three Times A Day 13)  Nitrostat 0.4 Mg Subl (Nitroglycerin) .... As Needed  Allergies (verified): 1)  ! Sulfa 2)  ! * Statins 3)  Sulfamethoxazole (Sulfamethoxazole) 4)  * Albuterol  PPM Specifications Following MD:  Lewayne Bunting, MD     PPM Vendor:  St Jude     PPM Model Number:  [id number]     PPM Serial Number:  [id number] PPM DOI:  03/11/2009     PPM Implanting MD:  Sherryl Manges, MD  Lead 1    Location: RV     DOI: 03/11/2009     Model #: [id number]     Serial #: [id number]     Status: active  Magnet Response Rate:  BOL 98.6 ERI  86.3    PPM Follow Up Remote Check?  No Battery Voltage:  2.79 V     Battery Est. Longevity:  10 years     Pacer Dependent:  No     Right Ventricle  Amplitude: 12 mV, Impedance: 616 ohms, Threshold: 0.375 V at 0.4 msec  Episodes Coumadin:  No Ventricular Pacing:  37%  Parameters Mode:  VVI     Lower Rate Limit:  60     Upper Rate Limit:  110 Next Cardiology Appt Due:  03/04/2011 Tech Comments:  Ventricular autocapture on today.  Device function normal.  Ventricular rates controlled, - coumadin.  ROV 6 months with Dr. Ladona Ridgel in [place]. Altha Harm, LPN  September 17, 2010 3:36 PM

## 2011-01-10 LAB — URINALYSIS, MICROSCOPIC ONLY
Ketones, ur: NEGATIVE mg/dL
Leukocytes, UA: NEGATIVE
Nitrite: NEGATIVE
Specific Gravity, Urine: 1.01 (ref 1.005–1.030)
Urobilinogen, UA: 0.2 mg/dL (ref 0.0–1.0)
pH: 5.5 (ref 5.0–8.0)

## 2011-01-10 LAB — CARDIAC PANEL(CRET KIN+CKTOT+MB+TROPI)
CK, MB: 1.9 ng/mL (ref 0.3–4.0)
CK, MB: 2 ng/mL (ref 0.3–4.0)
CK, MB: 2.6 ng/mL (ref 0.3–4.0)
Relative Index: INVALID (ref 0.0–2.5)
Relative Index: INVALID (ref 0.0–2.5)
Relative Index: INVALID (ref 0.0–2.5)
Total CK: 24 U/L (ref 7–232)
Total CK: 52 U/L (ref 7–232)
Troponin I: 0.03 ng/mL (ref 0.00–0.06)
Troponin I: 0.03 ng/mL (ref 0.00–0.06)
Troponin I: 0.04 ng/mL (ref 0.00–0.06)
Troponin I: 0.05 ng/mL (ref 0.00–0.06)

## 2011-01-10 LAB — DIFFERENTIAL
Basophils Absolute: 0 10*3/uL (ref 0.0–0.1)
Basophils Absolute: 0 10*3/uL (ref 0.0–0.1)
Basophils Absolute: 0 K/uL (ref 0.0–0.1)
Basophils Relative: 0 % (ref 0–1)
Eosinophils Absolute: 0 K/uL (ref 0.0–0.7)
Eosinophils Relative: 0 % (ref 0–5)
Eosinophils Relative: 0 % (ref 0–5)
Lymphocytes Relative: 16 % (ref 12–46)
Lymphocytes Relative: 9 % — ABNORMAL LOW (ref 12–46)
Lymphocytes Relative: 9 % — ABNORMAL LOW (ref 12–46)
Lymphs Abs: 0.6 K/uL — ABNORMAL LOW (ref 0.7–4.0)
Lymphs Abs: 0.8 10*3/uL (ref 0.7–4.0)
Monocytes Absolute: 0.4 10*3/uL (ref 0.1–1.0)
Monocytes Absolute: 0.6 10*3/uL (ref 0.1–1.0)
Monocytes Relative: 5 % (ref 3–12)
Neutro Abs: 6.1 10*3/uL (ref 1.7–7.7)
Neutro Abs: 6.1 K/uL (ref 1.7–7.7)
Neutro Abs: 6.7 10*3/uL (ref 1.7–7.7)
Neutrophils Relative %: 86 % — ABNORMAL HIGH (ref 43–77)

## 2011-01-10 LAB — CBC
HCT: 25.9 % — ABNORMAL LOW (ref 39.0–52.0)
HCT: 26 % — ABNORMAL LOW (ref 39.0–52.0)
HCT: 29.1 % — ABNORMAL LOW (ref 39.0–52.0)
Hemoglobin: 8.7 g/dL — ABNORMAL LOW (ref 13.0–17.0)
Hemoglobin: 8.7 g/dL — ABNORMAL LOW (ref 13.0–17.0)
MCHC: 32.5 g/dL (ref 30.0–36.0)
MCHC: 33.3 g/dL (ref 30.0–36.0)
MCHC: 33.5 g/dL (ref 30.0–36.0)
MCV: 86.7 fL (ref 78.0–100.0)
MCV: 88.3 fL (ref 78.0–100.0)
MCV: 88.9 fL (ref 78.0–100.0)
Platelets: 216 10*3/uL (ref 150–400)
Platelets: 218 10*3/uL (ref 150–400)
Platelets: 257 10*3/uL (ref 150–400)
RBC: 2.98 MIL/uL — ABNORMAL LOW (ref 4.22–5.81)
RBC: 2.99 MIL/uL — ABNORMAL LOW (ref 4.22–5.81)
RBC: 3.27 MIL/uL — ABNORMAL LOW (ref 4.22–5.81)
RDW: 19.6 % — ABNORMAL HIGH (ref 11.5–15.5)
RDW: 19.7 % — ABNORMAL HIGH (ref 11.5–15.5)
WBC: 7.1 10*3/uL (ref 4.0–10.5)
WBC: 7.4 10*3/uL (ref 4.0–10.5)
WBC: 8.2 10*3/uL (ref 4.0–10.5)
WBC: 8.7 10*3/uL (ref 4.0–10.5)

## 2011-01-10 LAB — URINALYSIS, ROUTINE W REFLEX MICROSCOPIC
Bilirubin Urine: NEGATIVE
Glucose, UA: 250 mg/dL — AB
Hgb urine dipstick: NEGATIVE
Ketones, ur: NEGATIVE mg/dL
Nitrite: NEGATIVE
Protein, ur: NEGATIVE mg/dL
Specific Gravity, Urine: 1.015 (ref 1.005–1.030)
Urobilinogen, UA: 0.2 mg/dL (ref 0.0–1.0)
pH: 5 (ref 5.0–8.0)

## 2011-01-10 LAB — BASIC METABOLIC PANEL WITH GFR
Calcium: 9.1 mg/dL (ref 8.4–10.5)
Creatinine, Ser: 1.52 mg/dL — ABNORMAL HIGH (ref 0.4–1.5)
GFR calc Af Amer: 54 mL/min — ABNORMAL LOW (ref >60)
GFR calc non Af Amer: 44 mL/min — ABNORMAL LOW (ref >60)
Sodium: 138 meq/L (ref 135–145)

## 2011-01-10 LAB — BLOOD GAS, ARTERIAL
Acid-Base Excess: 4.2 mmol/L — ABNORMAL HIGH (ref 0.0–2.0)
Acid-Base Excess: 8.9 mmol/L — ABNORMAL HIGH (ref 0.0–2.0)
Bicarbonate: 28.7 mEq/L — ABNORMAL HIGH (ref 20.0–24.0)
Patient temperature: 98.6
TCO2: 30.1 mmol/L (ref 0–100)
TCO2: 35.6 mmol/L (ref 0–100)
pCO2 arterial: 55.5 mmHg — ABNORMAL HIGH (ref 35.0–45.0)
pH, Arterial: 7.404 (ref 7.350–7.450)
pO2, Arterial: 71.3 mmHg — ABNORMAL LOW (ref 80.0–100.0)

## 2011-01-10 LAB — GLUCOSE, CAPILLARY
Glucose-Capillary: 144 mg/dL — ABNORMAL HIGH (ref 70–99)
Glucose-Capillary: 163 mg/dL — ABNORMAL HIGH (ref 70–99)
Glucose-Capillary: 170 mg/dL — ABNORMAL HIGH (ref 70–99)
Glucose-Capillary: 171 mg/dL — ABNORMAL HIGH (ref 70–99)
Glucose-Capillary: 176 mg/dL — ABNORMAL HIGH (ref 70–99)
Glucose-Capillary: 186 mg/dL — ABNORMAL HIGH (ref 70–99)
Glucose-Capillary: 210 mg/dL — ABNORMAL HIGH (ref 70–99)
Glucose-Capillary: 218 mg/dL — ABNORMAL HIGH (ref 70–99)
Glucose-Capillary: 218 mg/dL — ABNORMAL HIGH (ref 70–99)
Glucose-Capillary: 223 mg/dL — ABNORMAL HIGH (ref 70–99)
Glucose-Capillary: 237 mg/dL — ABNORMAL HIGH (ref 70–99)
Glucose-Capillary: 241 mg/dL — ABNORMAL HIGH (ref 70–99)
Glucose-Capillary: 244 mg/dL — ABNORMAL HIGH (ref 70–99)
Glucose-Capillary: 255 mg/dL — ABNORMAL HIGH (ref 70–99)
Glucose-Capillary: 278 mg/dL — ABNORMAL HIGH (ref 70–99)
Glucose-Capillary: 308 mg/dL — ABNORMAL HIGH (ref 70–99)
Glucose-Capillary: 350 mg/dL — ABNORMAL HIGH (ref 70–99)
Glucose-Capillary: 370 mg/dL — ABNORMAL HIGH (ref 70–99)

## 2011-01-10 LAB — BASIC METABOLIC PANEL
BUN: 23 mg/dL (ref 6–23)
BUN: 28 mg/dL — ABNORMAL HIGH (ref 6–23)
CO2: 24 mEq/L (ref 19–32)
CO2: 41 mEq/L — ABNORMAL HIGH (ref 19–32)
Calcium: 8.9 mg/dL (ref 8.4–10.5)
Calcium: 9.1 mg/dL (ref 8.4–10.5)
Chloride: 104 mEq/L (ref 96–112)
Creatinine, Ser: 1.5 mg/dL (ref 0.4–1.5)
GFR calc Af Amer: 59 mL/min — ABNORMAL LOW (ref >60)
GFR calc non Af Amer: 45 mL/min — ABNORMAL LOW (ref >60)
GFR calc non Af Amer: 49 mL/min — ABNORMAL LOW (ref >60)
Glucose, Bld: 232 mg/dL — ABNORMAL HIGH (ref 70–99)
Glucose, Bld: 306 mg/dL — ABNORMAL HIGH (ref 70–99)
Potassium: 3.5 mEq/L (ref 3.5–5.1)
Potassium: 4.3 mEq/L (ref 3.5–5.1)
Sodium: 142 mEq/L (ref 135–145)

## 2011-01-10 LAB — TROPONIN I: Troponin I: 0.03 ng/mL (ref 0.00–0.06)

## 2011-01-10 LAB — COMPREHENSIVE METABOLIC PANEL
AST: 14 U/L (ref 0–37)
Albumin: 2.8 g/dL — ABNORMAL LOW (ref 3.5–5.2)
BUN: 23 mg/dL (ref 6–23)
BUN: 27 mg/dL — ABNORMAL HIGH (ref 6–23)
CO2: 37 mEq/L — ABNORMAL HIGH (ref 19–32)
Calcium: 9 mg/dL (ref 8.4–10.5)
Chloride: 106 mEq/L (ref 96–112)
Chloride: 97 mEq/L (ref 96–112)
Creatinine, Ser: 1.37 mg/dL (ref 0.4–1.5)
Creatinine, Ser: 1.49 mg/dL (ref 0.4–1.5)
GFR calc Af Amer: 55 mL/min — ABNORMAL LOW (ref >60)
GFR calc non Af Amer: 45 mL/min — ABNORMAL LOW (ref >60)
GFR calc non Af Amer: 50 mL/min — ABNORMAL LOW (ref >60)
Total Bilirubin: 0.4 mg/dL (ref 0.3–1.2)
Total Bilirubin: 0.5 mg/dL (ref 0.3–1.2)

## 2011-01-10 LAB — CK TOTAL AND CKMB (NOT AT ARMC): Relative Index: INVALID (ref 0.0–2.5)

## 2011-01-10 LAB — MAGNESIUM: Magnesium: 2.2 mg/dL (ref 1.5–2.5)

## 2011-01-10 LAB — BRAIN NATRIURETIC PEPTIDE: Pro B Natriuretic peptide (BNP): 328 pg/mL — ABNORMAL HIGH (ref 0.0–100.0)

## 2011-01-10 LAB — PROTIME-INR
INR: 1.2 (ref 0.00–1.49)
Prothrombin Time: 15.2 seconds (ref 11.6–15.2)

## 2011-01-10 LAB — APTT: aPTT: 30 s (ref 24–37)

## 2011-01-10 LAB — POCT CARDIAC MARKERS
CKMB, poc: 1.4 ng/mL (ref 1.0–8.0)
Myoglobin, poc: 104 ng/mL (ref 12–200)
Troponin i, poc: <0.05 ng/mL (ref 0.00–0.09)

## 2011-01-10 LAB — URINE CULTURE
Colony Count: NO GROWTH
Culture: NO GROWTH

## 2011-01-11 LAB — GLUCOSE, CAPILLARY
Glucose-Capillary: 123 mg/dL — ABNORMAL HIGH (ref 70–99)
Glucose-Capillary: 133 mg/dL — ABNORMAL HIGH (ref 70–99)
Glucose-Capillary: 139 mg/dL — ABNORMAL HIGH (ref 70–99)
Glucose-Capillary: 139 mg/dL — ABNORMAL HIGH (ref 70–99)
Glucose-Capillary: 143 mg/dL — ABNORMAL HIGH (ref 70–99)
Glucose-Capillary: 146 mg/dL — ABNORMAL HIGH (ref 70–99)
Glucose-Capillary: 153 mg/dL — ABNORMAL HIGH (ref 70–99)
Glucose-Capillary: 166 mg/dL — ABNORMAL HIGH (ref 70–99)
Glucose-Capillary: 195 mg/dL — ABNORMAL HIGH (ref 70–99)
Glucose-Capillary: 222 mg/dL — ABNORMAL HIGH (ref 70–99)
Glucose-Capillary: 224 mg/dL — ABNORMAL HIGH (ref 70–99)
Glucose-Capillary: 225 mg/dL — ABNORMAL HIGH (ref 70–99)
Glucose-Capillary: 259 mg/dL — ABNORMAL HIGH (ref 70–99)
Glucose-Capillary: 260 mg/dL — ABNORMAL HIGH (ref 70–99)
Glucose-Capillary: 272 mg/dL — ABNORMAL HIGH (ref 70–99)
Glucose-Capillary: 294 mg/dL — ABNORMAL HIGH (ref 70–99)
Glucose-Capillary: 317 mg/dL — ABNORMAL HIGH (ref 70–99)
Glucose-Capillary: 358 mg/dL — ABNORMAL HIGH (ref 70–99)
Glucose-Capillary: 362 mg/dL — ABNORMAL HIGH (ref 70–99)
Glucose-Capillary: 362 mg/dL — ABNORMAL HIGH (ref 70–99)
Glucose-Capillary: 419 mg/dL — ABNORMAL HIGH (ref 70–99)
Glucose-Capillary: 81 mg/dL (ref 70–99)
Glucose-Capillary: 82 mg/dL (ref 70–99)
Glucose-Capillary: 116 mg/dL — ABNORMAL HIGH (ref 70–99)
Glucose-Capillary: 119 mg/dL — ABNORMAL HIGH (ref 70–99)
Glucose-Capillary: 123 mg/dL — ABNORMAL HIGH (ref 70–99)
Glucose-Capillary: 125 mg/dL — ABNORMAL HIGH (ref 70–99)
Glucose-Capillary: 130 mg/dL — ABNORMAL HIGH (ref 70–99)
Glucose-Capillary: 135 mg/dL — ABNORMAL HIGH (ref 70–99)
Glucose-Capillary: 137 mg/dL — ABNORMAL HIGH (ref 70–99)
Glucose-Capillary: 142 mg/dL — ABNORMAL HIGH (ref 70–99)
Glucose-Capillary: 149 mg/dL — ABNORMAL HIGH (ref 70–99)
Glucose-Capillary: 149 mg/dL — ABNORMAL HIGH (ref 70–99)
Glucose-Capillary: 150 mg/dL — ABNORMAL HIGH (ref 70–99)
Glucose-Capillary: 156 mg/dL — ABNORMAL HIGH (ref 70–99)
Glucose-Capillary: 163 mg/dL — ABNORMAL HIGH (ref 70–99)
Glucose-Capillary: 169 mg/dL — ABNORMAL HIGH (ref 70–99)
Glucose-Capillary: 169 mg/dL — ABNORMAL HIGH (ref 70–99)
Glucose-Capillary: 170 mg/dL — ABNORMAL HIGH (ref 70–99)
Glucose-Capillary: 170 mg/dL — ABNORMAL HIGH (ref 70–99)
Glucose-Capillary: 172 mg/dL — ABNORMAL HIGH (ref 70–99)
Glucose-Capillary: 172 mg/dL — ABNORMAL HIGH (ref 70–99)
Glucose-Capillary: 179 mg/dL — ABNORMAL HIGH (ref 70–99)
Glucose-Capillary: 182 mg/dL — ABNORMAL HIGH (ref 70–99)
Glucose-Capillary: 182 mg/dL — ABNORMAL HIGH (ref 70–99)
Glucose-Capillary: 185 mg/dL — ABNORMAL HIGH (ref 70–99)
Glucose-Capillary: 192 mg/dL — ABNORMAL HIGH (ref 70–99)
Glucose-Capillary: 198 mg/dL — ABNORMAL HIGH (ref 70–99)
Glucose-Capillary: 206 mg/dL — ABNORMAL HIGH (ref 70–99)
Glucose-Capillary: 207 mg/dL — ABNORMAL HIGH (ref 70–99)
Glucose-Capillary: 218 mg/dL — ABNORMAL HIGH (ref 70–99)
Glucose-Capillary: 235 mg/dL — ABNORMAL HIGH (ref 70–99)
Glucose-Capillary: 249 mg/dL — ABNORMAL HIGH (ref 70–99)
Glucose-Capillary: 251 mg/dL — ABNORMAL HIGH (ref 70–99)

## 2011-01-11 LAB — BASIC METABOLIC PANEL
BUN: 24 mg/dL — ABNORMAL HIGH (ref 6–23)
BUN: 25 mg/dL — ABNORMAL HIGH (ref 6–23)
BUN: 37 mg/dL — ABNORMAL HIGH (ref 6–23)
BUN: 43 mg/dL — ABNORMAL HIGH (ref 6–23)
BUN: 46 mg/dL — ABNORMAL HIGH (ref 6–23)
BUN: 48 mg/dL — ABNORMAL HIGH (ref 6–23)
CO2: 33 mEq/L — ABNORMAL HIGH (ref 19–32)
CO2: 29 mEq/L (ref 19–32)
CO2: 29 mEq/L (ref 19–32)
CO2: 30 mEq/L (ref 19–32)
CO2: 30 mEq/L (ref 19–32)
CO2: 30 mEq/L (ref 19–32)
Calcium: 8.2 mg/dL — ABNORMAL LOW (ref 8.4–10.5)
Calcium: 8.6 mg/dL (ref 8.4–10.5)
Calcium: 8.5 mg/dL (ref 8.4–10.5)
Calcium: 8.5 mg/dL (ref 8.4–10.5)
Calcium: 8.6 mg/dL (ref 8.4–10.5)
Calcium: 8.8 mg/dL (ref 8.4–10.5)
Calcium: 8.8 mg/dL (ref 8.4–10.5)
Calcium: 9 mg/dL (ref 8.4–10.5)
Calcium: 9 mg/dL (ref 8.4–10.5)
Chloride: 103 mEq/L (ref 96–112)
Chloride: 104 mEq/L (ref 96–112)
Chloride: 106 mEq/L (ref 96–112)
Creatinine, Ser: 1.56 mg/dL — ABNORMAL HIGH (ref 0.4–1.5)
Creatinine, Ser: 1.71 mg/dL — ABNORMAL HIGH (ref 0.4–1.5)
Creatinine, Ser: 1.83 mg/dL — ABNORMAL HIGH (ref 0.4–1.5)
Creatinine, Ser: 2.2 mg/dL — ABNORMAL HIGH (ref 0.4–1.5)
GFR calc Af Amer: 35 mL/min — ABNORMAL LOW (ref >60)
GFR calc Af Amer: 44 mL/min — ABNORMAL LOW (ref >60)
GFR calc Af Amer: 47 mL/min — ABNORMAL LOW (ref >60)
GFR calc Af Amer: 52 mL/min — ABNORMAL LOW (ref >60)
GFR calc Af Amer: 52 mL/min — ABNORMAL LOW (ref >60)
GFR calc Af Amer: 57 mL/min — ABNORMAL LOW (ref >60)
GFR calc non Af Amer: 39 mL/min — ABNORMAL LOW (ref >60)
GFR calc non Af Amer: 41 mL/min — ABNORMAL LOW (ref >60)
GFR calc non Af Amer: 38 mL/min — ABNORMAL LOW (ref >60)
GFR calc non Af Amer: 43 mL/min — ABNORMAL LOW (ref >60)
Glucose, Bld: 133 mg/dL — ABNORMAL HIGH (ref 70–99)
Glucose, Bld: 90 mg/dL (ref 70–99)
Glucose, Bld: 139 mg/dL — ABNORMAL HIGH (ref 70–99)
Glucose, Bld: 190 mg/dL — ABNORMAL HIGH (ref 70–99)
Potassium: 3.7 mEq/L (ref 3.5–5.1)
Potassium: 3.6 mEq/L (ref 3.5–5.1)
Potassium: 4.2 mEq/L (ref 3.5–5.1)
Potassium: 4.3 mEq/L (ref 3.5–5.1)
Sodium: 136 mEq/L (ref 135–145)
Sodium: 141 mEq/L (ref 135–145)

## 2011-01-11 LAB — COMPREHENSIVE METABOLIC PANEL
ALT: 12 U/L (ref 0–53)
ALT: 14 U/L (ref 0–53)
ALT: 15 U/L (ref 0–53)
AST: 15 U/L (ref 0–37)
AST: 16 U/L (ref 0–37)
Albumin: 2.4 g/dL — ABNORMAL LOW (ref 3.5–5.2)
Alkaline Phosphatase: 115 U/L (ref 39–117)
Alkaline Phosphatase: 96 U/L (ref 39–117)
BUN: 30 mg/dL — ABNORMAL HIGH (ref 6–23)
CO2: 33 mEq/L — ABNORMAL HIGH (ref 19–32)
Calcium: 8.4 mg/dL (ref 8.4–10.5)
Calcium: 8.6 mg/dL (ref 8.4–10.5)
Creatinine, Ser: 2.2 mg/dL — ABNORMAL HIGH (ref 0.4–1.5)
GFR calc Af Amer: 35 mL/min — ABNORMAL LOW (ref >60)
GFR calc non Af Amer: 29 mL/min — ABNORMAL LOW (ref >60)
GFR calc non Af Amer: 35 mL/min — ABNORMAL LOW (ref >60)
Glucose, Bld: 72 mg/dL (ref 70–99)
Potassium: 3.9 mEq/L (ref 3.5–5.1)
Sodium: 138 mEq/L (ref 135–145)
Sodium: 140 mEq/L (ref 135–145)
Sodium: 143 mEq/L (ref 135–145)
Total Protein: 5.3 g/dL — ABNORMAL LOW (ref 6.0–8.3)
Total Protein: 5.6 g/dL — ABNORMAL LOW (ref 6.0–8.3)

## 2011-01-11 LAB — URINALYSIS, ROUTINE W REFLEX MICROSCOPIC
Bilirubin Urine: NEGATIVE
Bilirubin Urine: NEGATIVE
Glucose, UA: NEGATIVE mg/dL
Glucose, UA: NEGATIVE mg/dL
Hgb urine dipstick: NEGATIVE
Ketones, ur: NEGATIVE mg/dL
Ketones, ur: NEGATIVE mg/dL
Protein, ur: NEGATIVE mg/dL
Protein, ur: NEGATIVE mg/dL
Protein, ur: NEGATIVE mg/dL
Urobilinogen, UA: 1 mg/dL (ref 0.0–1.0)
pH: 6 (ref 5.0–8.0)

## 2011-01-11 LAB — DIFFERENTIAL
Basophils Absolute: 0 10*3/uL (ref 0.0–0.1)
Basophils Absolute: 0 10*3/uL (ref 0.0–0.1)
Basophils Relative: 0 % (ref 0–1)
Basophils Relative: 0 % (ref 0–1)
Basophils Relative: 1 % (ref 0–1)
Basophils Relative: 1 % (ref 0–1)
Eosinophils Absolute: 0.1 10*3/uL (ref 0.0–0.7)
Eosinophils Absolute: 0.2 10*3/uL (ref 0.0–0.7)
Eosinophils Absolute: 0.3 10*3/uL (ref 0.0–0.7)
Eosinophils Absolute: 0.4 10*3/uL (ref 0.0–0.7)
Eosinophils Relative: 1 % (ref 0–5)
Eosinophils Relative: 3 % (ref 0–5)
Eosinophils Relative: 4 % (ref 0–5)
Lymphocytes Relative: 13 % (ref 12–46)
Lymphocytes Relative: 16 % (ref 12–46)
Lymphocytes Relative: 19 % (ref 12–46)
Lymphs Abs: 1.2 10*3/uL (ref 0.7–4.0)
Lymphs Abs: 1.3 10*3/uL (ref 0.7–4.0)
Lymphs Abs: 1.3 10*3/uL (ref 0.7–4.0)
Lymphs Abs: 1.6 10*3/uL (ref 0.7–4.0)
Monocytes Absolute: 0.9 10*3/uL (ref 0.1–1.0)
Monocytes Absolute: 0.9 10*3/uL (ref 0.1–1.0)
Monocytes Relative: 11 % (ref 3–12)
Monocytes Relative: 12 % (ref 3–12)
Monocytes Relative: 9 % (ref 3–12)
Neutro Abs: 5.1 10*3/uL (ref 1.7–7.7)
Neutro Abs: 5.7 10*3/uL (ref 1.7–7.7)
Neutro Abs: 6.1 10*3/uL (ref 1.7–7.7)
Neutrophils Relative %: 62 % (ref 43–77)
Neutrophils Relative %: 73 % (ref 43–77)
Neutrophils Relative %: 75 % (ref 43–77)

## 2011-01-11 LAB — CROSSMATCH: Antibody Screen: NEGATIVE

## 2011-01-11 LAB — POCT CARDIAC MARKERS
CKMB, poc: 1.1 ng/mL (ref 1.0–8.0)
Myoglobin, poc: 137 ng/mL (ref 12–200)

## 2011-01-11 LAB — RETICULOCYTES
RBC.: 2.32 MIL/uL — ABNORMAL LOW (ref 4.22–5.81)
Retic Count, Absolute: 99.8 10*3/uL (ref 19.0–186.0)
Retic Ct Pct: 4.3 % — ABNORMAL HIGH (ref 0.4–3.1)

## 2011-01-11 LAB — CBC
HCT: 27.5 % — ABNORMAL LOW (ref 39.0–52.0)
HCT: 27.8 % — ABNORMAL LOW (ref 39.0–52.0)
HCT: 23.5 % — ABNORMAL LOW (ref 39.0–52.0)
HCT: 26.1 % — ABNORMAL LOW (ref 39.0–52.0)
Hemoglobin: 9.4 g/dL — ABNORMAL LOW (ref 13.0–17.0)
Hemoglobin: 9.4 g/dL — ABNORMAL LOW (ref 13.0–17.0)
Hemoglobin: 8.3 g/dL — ABNORMAL LOW (ref 13.0–17.0)
Hemoglobin: 8.5 g/dL — ABNORMAL LOW (ref 13.0–17.0)
MCHC: 33.6 g/dL (ref 30.0–36.0)
MCHC: 34 g/dL (ref 30.0–36.0)
MCHC: 34.2 g/dL (ref 30.0–36.0)
MCHC: 34.5 g/dL (ref 30.0–36.0)
MCHC: 32.7 g/dL (ref 30.0–36.0)
MCHC: 33.1 g/dL (ref 30.0–36.0)
MCHC: 33.2 g/dL (ref 30.0–36.0)
MCHC: 33.4 g/dL (ref 30.0–36.0)
MCHC: 33.7 g/dL (ref 30.0–36.0)
MCHC: 33.9 g/dL (ref 30.0–36.0)
MCV: 84.7 fL (ref 78.0–100.0)
MCV: 87.2 fL (ref 78.0–100.0)
MCV: 83 fL (ref 78.0–100.0)
MCV: 84.6 fL (ref 78.0–100.0)
Platelets: 234 10*3/uL (ref 150–400)
Platelets: 242 10*3/uL (ref 150–400)
Platelets: 250 10*3/uL (ref 150–400)
Platelets: 271 10*3/uL (ref 150–400)
Platelets: 189 10*3/uL (ref 150–400)
Platelets: 194 10*3/uL (ref 150–400)
Platelets: 210 10*3/uL (ref 150–400)
Platelets: 277 10*3/uL (ref 150–400)
RBC: 3.19 MIL/uL — ABNORMAL LOW (ref 4.22–5.81)
RBC: 2.73 MIL/uL — ABNORMAL LOW (ref 4.22–5.81)
RBC: 2.91 MIL/uL — ABNORMAL LOW (ref 4.22–5.81)
RBC: 2.92 MIL/uL — ABNORMAL LOW (ref 4.22–5.81)
RBC: 3.02 MIL/uL — ABNORMAL LOW (ref 4.22–5.81)
RBC: 3.08 MIL/uL — ABNORMAL LOW (ref 4.22–5.81)
RDW: 17.8 % — ABNORMAL HIGH (ref 11.5–15.5)
RDW: 17.9 % — ABNORMAL HIGH (ref 11.5–15.5)
RDW: 18.3 % — ABNORMAL HIGH (ref 11.5–15.5)
RDW: 19.8 % — ABNORMAL HIGH (ref 11.5–15.5)
RDW: 16.8 % — ABNORMAL HIGH (ref 11.5–15.5)
RDW: 17.5 % — ABNORMAL HIGH (ref 11.5–15.5)
RDW: 17.5 % — ABNORMAL HIGH (ref 11.5–15.5)
RDW: 18.4 % — ABNORMAL HIGH (ref 11.5–15.5)
WBC: 7.5 10*3/uL (ref 4.0–10.5)
WBC: 8.2 10*3/uL (ref 4.0–10.5)
WBC: 14.4 10*3/uL — ABNORMAL HIGH (ref 4.0–10.5)
WBC: 9.7 10*3/uL (ref 4.0–10.5)

## 2011-01-11 LAB — BLOOD GAS, ARTERIAL
Drawn by: 31004
MECHVT: 500 mL
O2 Saturation: 98 %
O2 Saturation: 98.4 %
PEEP: 5 cmH2O
PEEP: 5 cmH2O
Patient temperature: 100.1
RATE: 12 resp/min
RATE: 14 resp/min
pCO2 arterial: 36.7 mmHg (ref 35.0–45.0)
pH, Arterial: 7.368 (ref 7.350–7.450)
pO2, Arterial: 96 mmHg (ref 80.0–100.0)

## 2011-01-11 LAB — URINE CULTURE
Colony Count: 60000
Colony Count: NO GROWTH
Colony Count: NO GROWTH
Culture: NO GROWTH

## 2011-01-11 LAB — PROTIME-INR
INR: 1.2 (ref 0.00–1.49)
Prothrombin Time: 15.6 seconds — ABNORMAL HIGH (ref 11.6–15.2)

## 2011-01-11 LAB — PHOSPHORUS
Phosphorus: 4.3 mg/dL (ref 2.3–4.6)
Phosphorus: 2.8 mg/dL (ref 2.3–4.6)

## 2011-01-11 LAB — IRON AND TIBC
Iron: 28 ug/dL — ABNORMAL LOW (ref 42–135)
Iron: 19 ug/dL — ABNORMAL LOW (ref 42–135)
TIBC: 304 ug/dL (ref 215–435)

## 2011-01-11 LAB — FOLATE RBC: RBC Folate: 1050 ng/mL — ABNORMAL HIGH (ref 180–600)

## 2011-01-11 LAB — CULTURE, BLOOD (ROUTINE X 2): Culture: NO GROWTH

## 2011-01-11 LAB — VITAMIN B12
Vitamin B-12: 698 pg/mL (ref 211–911)
Vitamin B-12: 695 pg/mL (ref 211–911)

## 2011-01-11 LAB — MAGNESIUM
Magnesium: 2.3 mg/dL (ref 1.5–2.5)
Magnesium: 2.2 mg/dL (ref 1.5–2.5)

## 2011-01-11 LAB — CULTURE, BAL-QUANTITATIVE W GRAM STAIN

## 2011-01-11 LAB — HEMOCCULT GUIAC POC 1CARD (OFFICE): Fecal Occult Bld: NEGATIVE

## 2011-01-11 LAB — BRAIN NATRIURETIC PEPTIDE: Pro B Natriuretic peptide (BNP): 187 pg/mL — ABNORMAL HIGH (ref 0.0–100.0)

## 2011-01-11 LAB — HEMOGLOBIN AND HEMATOCRIT, BLOOD
HCT: 26 % — ABNORMAL LOW (ref 39.0–52.0)
Hemoglobin: 8.9 g/dL — ABNORMAL LOW (ref 13.0–17.0)

## 2011-01-11 LAB — URINE MICROSCOPIC-ADD ON

## 2011-01-11 LAB — RPR: RPR Ser Ql: NONREACTIVE

## 2011-01-12 LAB — CARDIAC PANEL(CRET KIN+CKTOT+MB+TROPI)
CK, MB: 1.9 ng/mL (ref 0.3–4.0)
Relative Index: INVALID (ref 0.0–2.5)
Relative Index: INVALID (ref 0.0–2.5)
Relative Index: INVALID (ref 0.0–2.5)
Troponin I: 0.02 ng/mL (ref 0.00–0.06)

## 2011-01-12 LAB — BLOOD GAS, ARTERIAL
Acid-Base Excess: 4.8 mmol/L — ABNORMAL HIGH (ref 0.0–2.0)
Drawn by: 309961
FIO2: 1 %
MECHVT: 550 mL
MECHVT: 550 mL
O2 Saturation: 100 %
O2 Saturation: 99.6 %
PEEP: 5 cmH2O
RATE: 14 resp/min
RATE: 20 resp/min
pO2, Arterial: 144 mmHg — ABNORMAL HIGH (ref 80.0–100.0)

## 2011-01-12 LAB — URINALYSIS, ROUTINE W REFLEX MICROSCOPIC
Bilirubin Urine: NEGATIVE
Hgb urine dipstick: NEGATIVE
Ketones, ur: NEGATIVE mg/dL
Nitrite: NEGATIVE
Specific Gravity, Urine: 1.012 (ref 1.005–1.030)
pH: 5.5 (ref 5.0–8.0)

## 2011-01-12 LAB — BODY FLUID CULTURE: Culture: NO GROWTH

## 2011-01-12 LAB — DIFFERENTIAL
Lymphocytes Relative: 11 % — ABNORMAL LOW (ref 12–46)
Lymphs Abs: 0.9 10*3/uL (ref 0.7–4.0)
Monocytes Relative: 9 % (ref 3–12)
Neutro Abs: 6.5 10*3/uL (ref 1.7–7.7)
Neutrophils Relative %: 78 % — ABNORMAL HIGH (ref 43–77)

## 2011-01-12 LAB — GLUCOSE, CAPILLARY
Glucose-Capillary: 252 mg/dL — ABNORMAL HIGH (ref 70–99)
Glucose-Capillary: 274 mg/dL — ABNORMAL HIGH (ref 70–99)

## 2011-01-12 LAB — BODY FLUID CELL COUNT WITH DIFFERENTIAL: Neutrophil Count, Fluid: 7 % (ref 0–25)

## 2011-01-12 LAB — CBC
Hemoglobin: 8.1 g/dL — ABNORMAL LOW (ref 13.0–17.0)
MCV: 84 fL (ref 78.0–100.0)
MCV: 84.8 fL (ref 78.0–100.0)
Platelets: 186 10*3/uL (ref 150–400)
RBC: 2.9 MIL/uL — ABNORMAL LOW (ref 4.22–5.81)
RBC: 3.17 MIL/uL — ABNORMAL LOW (ref 4.22–5.81)
WBC: 8 10*3/uL (ref 4.0–10.5)
WBC: 8.4 10*3/uL (ref 4.0–10.5)

## 2011-01-12 LAB — TYPE AND SCREEN: Antibody Screen: NEGATIVE

## 2011-01-12 LAB — POCT I-STAT, CHEM 8
BUN: 43 mg/dL — ABNORMAL HIGH (ref 6–23)
Chloride: 104 mEq/L (ref 96–112)
Creatinine, Ser: 2 mg/dL — ABNORMAL HIGH (ref 0.4–1.5)
Glucose, Bld: 271 mg/dL — ABNORMAL HIGH (ref 70–99)
Potassium: 4 mEq/L (ref 3.5–5.1)
Sodium: 136 mEq/L (ref 135–145)

## 2011-01-12 LAB — TRIGLYCERIDES, BODY FLUIDS: Triglycerides, Fluid: 21 mg/dL

## 2011-01-12 LAB — BASIC METABOLIC PANEL
BUN: 39 mg/dL — ABNORMAL HIGH (ref 6–23)
CO2: 33 mEq/L — ABNORMAL HIGH (ref 19–32)
Chloride: 99 mEq/L (ref 96–112)
Creatinine, Ser: 2.02 mg/dL — ABNORMAL HIGH (ref 0.4–1.5)
Glucose, Bld: 293 mg/dL — ABNORMAL HIGH (ref 70–99)

## 2011-01-12 LAB — COMPREHENSIVE METABOLIC PANEL
ALT: 9 U/L (ref 0–53)
AST: 17 U/L (ref 0–37)
Albumin: 3.3 g/dL — ABNORMAL LOW (ref 3.5–5.2)
CO2: 29 mEq/L (ref 19–32)
Calcium: 8.5 mg/dL (ref 8.4–10.5)
Creatinine, Ser: 2.07 mg/dL — ABNORMAL HIGH (ref 0.4–1.5)
GFR calc Af Amer: 38 mL/min — ABNORMAL LOW (ref >60)
Sodium: 136 mEq/L (ref 135–145)
Total Protein: 6.5 g/dL (ref 6.0–8.3)

## 2011-01-12 LAB — PROTEIN, BODY FLUID: Total protein, fluid: 3.1 g/dL

## 2011-01-12 LAB — CHOLESTEROL, BODY FLUID

## 2011-01-12 LAB — BRAIN NATRIURETIC PEPTIDE
Pro B Natriuretic peptide (BNP): 246 pg/mL — ABNORMAL HIGH (ref 0.0–100.0)
Pro B Natriuretic peptide (BNP): 272 pg/mL — ABNORMAL HIGH (ref 0.0–100.0)

## 2011-01-12 LAB — POCT CARDIAC MARKERS
CKMB, poc: <1 ng/mL — ABNORMAL LOW (ref 1.0–8.0)
Myoglobin, poc: 167 ng/mL (ref 12–200)
Troponin i, poc: <0.05 ng/mL (ref 0.00–0.09)

## 2011-01-12 LAB — MAGNESIUM: Magnesium: 2.4 mg/dL (ref 1.5–2.5)

## 2011-01-12 LAB — ABO/RH: ABO/RH(D): O POS

## 2011-01-12 LAB — AFB CULTURE WITH SMEAR (NOT AT ARMC)

## 2011-01-12 LAB — LACTATE DEHYDROGENASE, PLEURAL OR PERITONEAL FLUID: LD, Fluid: 94 U/L — ABNORMAL HIGH (ref 3–23)

## 2011-01-12 LAB — APTT: aPTT: 32 seconds (ref 24–37)

## 2011-02-15 NOTE — Consult Note (Signed)
NAMELYNDEN, FLEMMER                 ACCOUNT NO.:  [id number]   MEDICAL RECORD NO.:  [id number]          PATIENT TYPE:  INP   LOCATION:  IC10                          FACILITY:  [facility]   PHYSICIAN:  Gerrit Friends. Dietrich Pates, MD, FACCDATE OF BIRTH:  1928-01-07   DATE OF CONSULTATION:  12/21/2007  DATE OF DISCHARGE:                                 CONSULTATION   REFERRING PHYSICIAN:  Dr. Elige Radon.   PRIMARY CARE DOCTOR:  Madelin Rear. Sherwood Gambler, MD.   PRIMARY CARDIOLOGIST:  Rollene Rotunda, MD   HISTORY OF PRESENT ILLNESS:  A 76 year old gentleman with history of  diastolic congestive heart failure admitted for the acute onset of  dyspnea with pulmonary edema by chest x-ray.  Mr. Millstein' cardiac  history dates to at least 2005 when he underwent cardiac  catheterization.  This demonstrated mild-to-moderate disease with the  worst lesions being sequential 80% and 75% stenoses of the mid-RCA.  Medical therapy was elected.  Left ventricular systolic function was  normal.  He did well, until early in the morning of admission when he  awoke with extreme dyspnea and anxiety.  On arrival to the emergency  department, he was noted to be in moderate respiratory distress.  A  chest x-ray showed mild-to-moderate pulmonary edema.  He has improved  with initial diuresis.  He is unaware of anything that caused the onset  of his symptoms other than some dietary indiscretion of late, due to a  eating a number of meals in restaurants.  He was not able to achieve any  relief at home.  He was improved after the initiation of supplemental  oxygen.   Mr. Mussa has a 100 pack-year history of cigarette smoking.  He has  stopped on a number of occasions, but recently restarted smoking.  He  has a component of COPD.  Other history of vascular importance includes  longstanding diabetes treated with oral agents and hypertension.  He has  both known cerebrovascular disease and peripheral vascular disease, and  has been  offered surgery for both, but has refused.   PAST MEDICAL HISTORY OTHERWISE NOTABLE FOR:  1. History of peptic ulcer disease.  2. A prior cholecystectomy.  3. He has had an episode of pancreatitis in the past.  4. DJD.   SOCIAL HISTORY:  Married and lives locally with his wife; retired from a  career in [employer]; no excessive use of alcohol.   FAMILY HISTORY:  Positive for neoplastic disease; negative for coronary  disease.   MEDICATIONS PRIOR TO ADMISSION:  1. Metformin 500 mg b.i.d.  2. Isosorbide mononitrate 30 mg b.i.d.  3. KCl 28 mEq daily.  4. Glyburide 5 mg t.i.d.  5. Azetamide10 mg daily.  6. Furosemide 40 mg daily (medication was not held prior to recent      cataract surgery).  7. Diamox ophthalmologic solution.  8. Warfarin per instructions.  9. Multiple other ophthalmologic preparations.   ALLERGIES:  An allergy to SULFA drugs is reported.  He also has been  intolerant of STATIN drugs.   REVIEW OF SYSTEMS:  Notable for use of  a nebulizer and use of a cane at  home.  He had a chronic nonproductive cough.  He typically has class II-  III dyspnea on exertion.  He required intubation for respiratory failure  in 2005.  He has not received pneumococcal vaccine nor influenza  vaccine.  He has had gout in the joints of the lower extremities.  He  has mild chronic edema.  He follows a regular diet.  He requires  corrective lenses for near-and-far vision.  He underwent left cataract  surgery some years ago, and right cataract surgery.  The day prior to  admission.  He has upper-and-lower dentures.  All other systems reviewed  and are negative.   PHYSICAL EXAMINATION:  GENERAL:  On admission on exam, a pleasant  gentleman with mild respiratory distress.  VITAL SIGNS: The heart rate is 95 and regular, respirations 15, blood  pressure 130/75, afebrile.  O2 saturation 94% on 2 liters.  HEENT:  Normal lids and conjunctivae; normal oral mucosa.  NECK:  Mild jugular venous  distention; normal carotid upstrokes without  bruits.  ENDOCRINE:  No thyromegaly.  HEMATOPOIETIC:  No adenopathy.  LUNGS:  A few bibasilar rales.  CARDIAC:  Normal first and second heart sound; minimal systolic ejection  murmur; irregular rhythm that is slightly rapid.  ABDOMEN:  Mildly distended; firm; nontender; normal bowel sounds without  bruits.  EXTREMITIES:  1+ ankle edema; distal pulses reduced.  NEUROLOGIC:  Symmetric strength and tone; normal cranial nerves.   EKG:  Atrial fibrillation at a rate of 100; left anterior fascicular  block; incomplete right bundle branch block; frequent PVCs versus  aberrancy; delayed R-wave progression--prior septal myocardial  infarction; lateral ST-segment depression and T-wave inversion.   CHEST X-RAY:  Mild-to-moderate pulmonary edema, more prominent on the  right; small left pleural effusion; cardiomegaly; calcification in the  wall of the ascending aorta.  When compared to a prior study of July 19, 2007, there is more confluence of edema in the right lung field.   Other laboratory notable for negative cardiac markers, an INR of 1.1, a  BNP level of 481, a potassium of 4.8 with a creatinine of 1.45, and a  glucose of 318. A white count of 14,900 with normal hemoglobin and  normal platelets.   IMPRESSION:  Mr. Traynham presents with the sudden onset of dyspnea and  radiographic findings of congestive heart failure.  He has improved with  diuresis.  He is not anticoagulated.  In the setting of active  congestive heart failure and atrial fibrillation, we will anticoagulate  him acutely with Lovenox, pending achievement of adequate  Coumadin dose.  He will require continuing diuresis as tolerated by his  renal function, and probably decrease in his daily dose of furosemide 80  mg daily.  We appreciate the request for consultation, and will be happy  to follow this nice gentleman with you.      Gerrit Friends. Dietrich Pates, MD, [facility]   Electronically Signed     RMR/MEDQ  D:  12/21/2007  T:  12/21/2007  Job:  [id number]

## 2011-02-15 NOTE — Group Therapy Note (Signed)
[name], SWINGER                 ACCOUNT NO.:  [id number]   MEDICAL RECORD NO.:  [id number]          PATIENT TYPE:  INP   LOCATION:  A222                          FACILITY:  [facility]   PHYSICIAN:  Dorris Singh, DO    DATE OF BIRTH:  07/21/1928   DATE OF PROCEDURE:  DATE OF DISCHARGE:                                 PROGRESS NOTE   The patient seen today sitting up in the chair, states that he has been  sleeping in the chair but feels really good today.  Also, he is refusing  the breathing treatments because he states that they set his throat on  fire, but states that he feels that his breathing has improved as well.  Talked to him about seeing his pulmonologist once he is discharged,  maybe get better medications to keep his COPD under control.  The  patient stated understanding.   His temperature is 97.0, pulse is 79, respirations 22, blood pressure  108/55.  The patient is a 77 year old who is well-developed, well-  nourished, in no acute distress.  HEART:  Distant heart sounds with S1 and S2.  LUNGS:  Decreased breath sounds.  No wheezes, rales or rhonchi noted.  ABDOMEN:  Obese, soft, nondistended.  EXTREMITIES:  Trace edema in his lower extremities.  NEUROLOGIC:  A&O x3.   His labs for today are as follows:  His white count is 14.4, hemoglobin  12.0, hematocrit 34.3 and his platelet count is 236.  His INR is 1.4.  Sodium is 133, potassium is 4.9, chloride 100, CO2 27, glucose 243, BUN  49 and creatinine 1.79.  His BNP is 204 which is improved.   ASSESSMENT AND PLAN:  1. Acute exacerbation of congestive heart failure.  He seems to be      improving.  Will go ahead and continue with his daily weights.      Would like [facility] to see the patient tomorrow.  If they      approve, plan on discharging tomorrow.  2. Hyperglycemia.  He is on the glycemic protocol but may have to      adjust that while he is on steroids and will actually decrease his      steroids  today.  3. Chronic obstructive pulmonary disease.  Seems to be stable so as      mentioned above will decrease his steroid usage and see how he      does.  4. Also has a leukocytosis.  His leukocytosis has returned.  He      currently is not on any antibiotics at this point in time.  Will      consider empirical antibiotics after I obtain some blood cultures      as well as sputum cultures to due to his respiratory history.      Dorris Singh, DO  Electronically Signed    CB/MEDQ  D:  12/23/2007  T:  12/23/2007  Job:  [id number]

## 2011-02-15 NOTE — Discharge Summary (Signed)
NAMESINCERE, LIUZZI                 ACCOUNT NO.:  [id number]   MEDICAL RECORD NO.:  [id number]          PATIENT TYPE:  INP   LOCATION:  A222                          FACILITY:  [facility]   PHYSICIAN:  Skeet Latch, DO    DATE OF BIRTH:  Aug 05, 1928   DATE OF ADMISSION:  12/21/2007  DATE OF DISCHARGE:  03/25/2009LH                               DISCHARGE SUMMARY   DISCHARGE DIAGNOSES:  1. Acute exacerbation of congestive heart failure.  2. Status post nonsustained non-ST-elevation myocardial infarction and      elevated troponin.  3. Ejection fraction of 40%.  4. History of diet type 2 diabetes.  5. History of chronic obstructive pulmonary disease.  6. History of atrial fibrillation.  7. History of peripheral artery disease.   BRIEF HOSPITAL COURSE:  [name] is a 77 year old Caucasian who  presents to [facility] with complaint of shortness of breath.  The  patient has a history of this and states that it got worse.  Nothing  made it better and nothing relieved it.  Significant history of COPD and  coronary artery disease and presented to emergency room for evaluation.  The patient was admitted with diagnosis of acute CHF.  The patient was  initially admitted to the ICU placed on BiPAP.  Respiratory status  improved.  He was transferred out of the ICU.  The patient was started  on diuretic therapy.  Cardiology was consulted.  The patient had a 2-D  echo also ordered.  For diabetes, the patient was placed on sliding  scale as well as home medications also.  The patient doing fairly well  and his cardiac enzymes became extremely elevated.  Cardiology saw the  patient felt the patient will need a cardiac cath at [facility].  The patient also found to have a decreased ejection fraction  on his 2-D echocardiogram.  At this time, the patient has pre-cath  orders to have a catheterization at [facility] and was felt to  have it on December 25, 2007 and now is going to have it  on December 26, 2007.  Orders have been written per cardiology at this time.  The patient  states he is feeling well.  No major complaints.  States the shortness  of breath is improved and is waiting on transfer at this time.   VITAL SIGNS:  Temperature 97.9, pulse 84, respirations 20, blood  pressure 142/82, satting 99% on room air.   CURRENT LABS:  Sodium 34, potassium 34.6, chloride 103, CO2 25, glucose  267, BUN 46, creatinine 1.65, PT is 18.7, INR 1.5, white count 6900,  hemoglobin 12.1, hematocrit 34.8, platelets 171,000.  Last BNP was 204.  Last troponin was 6.19.  His troponin peaked that is 8.99 on December 21, 2007.  Urine cultures negative well as was blood cultures.  Current  medications in the hospital include Mucomyst 600 mg q.12h, Coreg 6.25 mg  twice a day, Colace 100 daily, heparin 90 mg subcutaneous q.12h, Zetia  10 mg daily, Lasix 20 mg IV twice a day, glyburide 5 mg  3 times a day,  insulin sliding scale, Lantus 10 units subcutaneous nightly.  Imdur 30  mg twice a day, Vigamox eye drops q.i.d., Protonix 40 mg IV daily.  K-  Dur 20 mEq daily, prednisone eye drops q.i.d., Tylenol 650 mg p.o. q.4h  p.r.n. and Tessalon Perles 100 mg q.6h p.r.n., Dilaudid 1 mg IV q.4h  p.r.n., breathing treatments as needed, Zofran 4 mg IV q.8h p.r.n. and  Ambien 5 mg nightly p.r.n.   CONDITION ON DISCHARGE:  Stable.   DISPOSITION:  The patient be discharged to a [facility] for  cardiac catheterization under the care of [facility].      Skeet Latch, DO  Electronically Signed     SM/MEDQ  D:  12/25/2007  T:  12/25/2007  Job:  [id number]

## 2011-02-15 NOTE — Discharge Summary (Signed)
NAMETYRONE, PAUTSCH                 ACCOUNT NO.:  [id number]   MEDICAL RECORD NO.:  [id number]          PATIENT TYPE:  INP   LOCATION:  2006                         FACILITY:  [facility]   PHYSICIAN:  Bevelyn Buckles. Bensimhon, MDDATE OF BIRTH:  17-Feb-1928   DATE OF ADMISSION:  01/29/2009  DATE OF DISCHARGE:  02/13/2009                               DISCHARGE SUMMARY   PRIMARY CARDIOLOGIST:  Rollene Rotunda, M.D.   PRIMARY CARE:  Madelin Rear. Sherwood Gambler, M.D.   DISCHARGING PHYSICIAN:  Nicholes Mango, M.D. .   DISCHARGE DIAGNOSES:  1. Large left pleural effusion, status post chest tube this admission.  2. Congestive heart failure, secondary to ischemic cardiomyopathy with      ejection fraction of 40%.  3. Chronic atrial fibrillation.  4. Delirium/dementia, requiring psychiatry input this admission.  5. Urinary retention, suspect secondary to psychotropic medications.      Foley catheter placed prior to discharge.  Patient to follow up      with Dr. Vernie Ammons with urology next week for voiding trial.  6. Coronary artery disease.  7. Chronic obstructive pulmonary disease with ongoing tobacco abuse.  8. Chronic anemia.  9. Patient not a candidate for anticoagulation therapy secondary to      gastrointestinal  bleed.  10.Chronic renal insufficiency with a creatinine of 1.56 prior to      discharge.   PAST MEDICAL HISTORY:  1. Chronic systolic congestive heart failure, secondary to ischemic      cardiomyopathy.  2. Coronary artery disease, previous stenting.  3. Chronic dyspnea.  4. Dyslipidemia.  5. Hypertension.  6. Type 2 diabetes.  7. Peptic ulcer disease with gastrointestinal bleed in April 2009.  8. Peripheral vascular disease.  9. Status post cholecystectomy.   CONSULTS THIS ADMISSION:  Include critical care medicine on January 30, 2009.  Dr. Antonietta Breach on Feb 11, 2009.   PROCEDURES/TESTS THIS ADMISSION:  1. April 29, CT of the chest without contrast, showing 1) a large left  and small right pleural effusions with associated compressive      atelectasis, worse on the left.  2) Emphysema.  2. On January 30, 2009, thoracentesis/intubation performed by Dr. Molli Knock      with placement of a chest tube.  Same date, placement of NG tube.      Same date, placement of arterial catheter.  Same date, placement of      a central venous catheter.  3. On May 6, CT of the head without contrast, showing no acute      findings.  Atrophy and chronic ischemic white matter disease noted.      Small, likely old, right centrum semiovale lacunar infarct.  4. Jan 31, 2009, bronchoscopy.  5. Feb 01, 2009, extubated.   HOSPITAL COURSE:  Mr. Lazalde is a 77 year old Caucasian gentleman,  followed by Dr. Rollene Rotunda in the office setting, admitted on April  29 for progressive dyspnea in the setting of heart failure and a large  pleural effusion.  The patient's Lasix recently increased.  However,  poor response.  He finally came to the ER.  His BNP was elevated at 246.  Chest x-ray showed mild CHF with a large left pleural effusion,  atelectasis of his left lung.   The patient was admitted.  His major source of his symptoms was felt to  be due to the left pleural effusion.  Patient admitted, CT of the chest  obtained, results as stated above.  Critical care medicine was asked to  assist with thoracentesis.  The patient was seen in consult on January 30, 2009 with plans to perform thoracentesis.  The patient, however, with  ongoing dyspnea, unable to lie flat.  It was decided to transfer the  patient to coronary care, sedate and intubate patient.  Thoracentesis  performed at that time and chest tube placed by Dr. Molli Knock.  An NG tube  was also placed.  The patient was maintained on a ventilator for  respiratory assistance.  Note:  Chest tube placed with 2000 mL of  serosanguineous fluid removed.  Fluid sent for analysis.  Postprocedure,  patient experienced a gradual decrease in blood  pressure with a MAP of  53.  The patient received normal saline bolus and diuretics placed on  hold.  On Jan 31, 2009, patient underwent a bronchoscopy by Dr. Delton Coombes for  diagnostic evaluation of airways and evaluation of left lower lobe  collapse.  Left lower lobe bronchus with copious white, thick secretions  that were suctioned, tolerated well.  On Feb 01, 2009, patient extubated,  no complications, placed on 2 liters.  Patient weaned off fentanyl and  Versed drips, awake and comfortable.   Chest x-ray: Left lower lobe airspace disease collapse actually better  than previously, but still with atelectasis, infiltrate.   Potassium 3.8.  Hematocrit 24.2, WBCs elevated at 14.4.  This was on Feb 01, 2009.  Cultures obtained.  Urine was negative.  Blood cultures  obtained on May 2 negative.  Pleural fluid:  No growth to date on Feb 03, 2009.  Feb 03, 2009 chest tube placed on water seal.  On May 5, WBC 8.3,  hematocrit 24.6.  Chest x-ray showed that chest tube had migrated out of  thoracic cavity.  Left lower lobe opacity unchanged.  No obvious  pneumothorax.  However, with persistent left lower lobe atelectasis and  significant secretions, described as mucopurulent, the patient was  placed on Avelox for prophylactic measurements.  The chest tube was  discontinued.   On May 6 the patient was noted to be very agitated.  Dr. Jeanie Sewer was  asked to assist in evaluation.  Delirium, not specified.  Dr. Jeanie Sewer  felt there were a number of factors contributing to his delirium.  He  recommended using Ativan and Zyprexa.  He discontinued patient's Elavil.  The patient continued to have a problems with agitation, requiring  Ativan use p.r.n.   Cardiac standpoint remained stable.  CT of the head showed no acute  findings.  Patient with extreme deconditioning, mental status changes.  After discussion with family it is felt that skilled nursing facility  placement would be most beneficial for  patient at this time.  Dr.  Jeanie Sewer rechecked on the patient on Feb 11, 2009.  Dementia without  other symptoms.  Delirium without other symptoms.  Continue Zyprexa, and  he cleared patient for skilled nursing facility placement.  After the  initiation of Zyprexa, patient noted to have problems voiding, sleeping  poorly secondary to frequent bathroom visits.  Dr. Gala Romney concerned  that effects of Zyprexa may be affecting  patient (psychotropic  medicine).  The plan is to have a Foley catheter placed.  Dr. Juanda Chance  spoke with Dr. Vernie Ammons with urology, agrees with Foley and Flomax.  Okay  to DC to skilled nursing facility.  Will give appointment to follow up  with urology next week for a voiding trial in office.  Dr. Gala Romney to  arrange this.   At time of discharge, patient is afebrile.  Urinalysis negative.  TSH  1.584.  Most recent BMET checked on May 11.  Potassium 4.0, creatinine  1.56.  CBC checked on May 11, hematocrit 24.4, platelets 277,000.   MEDICATIONS AT DISCHARGE TO SKILLED NURSING FACILITY:  1. Flomax 0.4 mg daily.  2. Isosorbide mononitrate 30 mg daily.  3. Aspirin 81.  4. Plavix 75.  5. Carvedilol 6.25 b.i.d.  6. Hydralazine 12.5 t.i.d.  7. Pepcid 40 mg b.i.d.  8. Pulmicort inhaler b.i.d.  9. Furosemide 40 mg b.i.d.  10.Xopenex 0.63 q.i.d.  11.Spiriva inhaled daily.  12.Zyprexa 5 mg at bedtime.  13.Nu-Iron 150 mg t.i.d. with meals.   Patient to follow up with urology per Dr. Gala Romney.  Foley catheter to  straight drain.  Will arrange appointment with Dr. Antoine Poche within the  next 3 weeks.   Duration of discharge encounter well over 30 minutes.      Dorian Pod, ACNP      Bevelyn Buckles. Bensimhon, MD  Electronically Signed    MB/MEDQ  D:  02/13/2009  T:  02/13/2009  Job:  [id number]   cc:   Madelin Rear. Sherwood Gambler, MD

## 2011-02-15 NOTE — Consult Note (Signed)
[name], [name]                 ACCOUNT NO.:  [id number]   MEDICAL RECORD NO.:  [id number]          PATIENT TYPE:  INP   LOCATION:  4735                         FACILITY:  [facility]   PHYSICIAN:  Rollene Rotunda, MD, FACCDATE OF BIRTH:  December 01, 1927   DATE OF CONSULTATION:  DATE OF DISCHARGE:                                 CONSULTATION   PRIMARY:  Dr. Artis Delay.   PROCEDURE:  Left heart catheterization/coronary arteriography   INDICATIONS:  Evaluate the patient with coronary disease and flash  pulmonary edema.  He also ruled in for non-Q-wave myocardial infarction.   PROCEDURE:  Left heart catheterization performed via the left femoral  artery.  The artery was cannulated using anterior wall puncture.  A #6-  French arterial sheath was inserted via the modified Seldinger  technique.  Preformed Judkins and pigtail catheter were utilized.  I  used a left groin as he previously had obstruction on the right, and I  was unable to pass a Wholey wire in 2005.  He did have a slightly  diminished pulse on the right compared to the left as well.   RESULTS:  Hemodynamics:  LV 135/16, AO 133/73.   CORONARIES:  Left main was normal.  The LAD had proximal severe  calcification.  There was a proximal 30-40% lesion.  There are diffuse  mid and distal luminal irregularities.  First diagonal was moderate size  with diffuse disease nonobstructive, second diagonal was small with  diffuse disease nonobstructive.  The circumflex in the AV groove had  diffuse 30-40% lesions.  There was a large branching ramus intermediate.  It had 95% stenosis before the bifurcation.  The right coronary artery  was dominant.  It had a proximal long 90% stenosis.  There was vamping  of the catheter waveform when this vessel was engaged.  There was mid  95% stenosis.  There was distal 50% stenosis before the PDA.  The distal  vessels were somewhat small with moderate diffuse disease but free of  high-grade lesions.   THE LEFT VENTRICULOGRAM:  The left ventricle was not injected secondary  to renal insufficiency.   CONCLUSION:  Severe two-vessel coronary artery disease.   PLAN:  I plan elective PCI and we will review the films with Dr. Juanda Chance  who would be here to perform this in the morning.  I think we should  attempt both the circumflex and the right coronary.  Will have to be  careful of his renal function.  He does have a dye allergy and he will  be prophylaxed.Rollene Rotunda, MD, [facility]  Electronically Signed     JH/MEDQ  D:  12/26/2007  T:  12/26/2007  Job:  [id number]   cc:   Madelin Rear. Sherwood Gambler, MD

## 2011-02-15 NOTE — Op Note (Signed)
[name], [name]                 ACCOUNT NO.:  [id number]   MEDICAL RECORD NO.:  [id number]          PATIENT TYPE:  INP   LOCATION:  IC04                          FACILITY:  [facility]   PHYSICIAN:  R. Roetta Sessions, M.D. DATE OF BIRTH:  18-Apr-1928   DATE OF PROCEDURE:  02/19/2009  DATE OF DISCHARGE:                               OPERATIVE REPORT   .   INDICATIONS FOR PROCEDURE:  A 77 year old gentleman admitted to the  hospital with recent pleural effusion, status post chest tube placement  __________  chronic anemia, but no objective evidence of GI bleeding  this admission.  He was reported to have some melena when he was down in  [facility] recently.  He was Hemoccult negative a couple weeks ago.  Hemoglobin was down in the 6 range.  He is 9.4 __________  chest pain.  INR 1.1.  EGD is now being done given his recent history of duodenal  ulcer disease in the not too distant past.  Risks, benefits and  alternatives __________ have been reviewed with patient and family.  __________  blood pressure, pulse respirations monitored entire  procedure.   CONSCIOUS SEDATION:  1 mg IV of Versed, Demerol 25 mg IV,   Cetacaine spray for topical pharyngeal anesthesia.   INSTRUMENT:  Pentax video chip system.   FINDINGS:  Examination of the tubular esophagus revealed no mucosal  abnormalities.  EG junction easily traversed.  Duodenum, stomach,  __________ gas cavity was emptied, insufflated well with air.  Thorough  examination of the gastric mucosa, including retroflexion of proximal  stomach, esophagogastric junction, demonstrated only a small hiatal  hernia.  Gastric mucosa otherwise appeared entirely normal.  Pylorus was  patent, easily traversed.  Examination of the bulb and second portion  revealed a couple of tiny bulbar erosions and no ulcer or other  abnormality, however.   Therapy/diagnostic maneuvers performed:  None.   The patient tolerated procedure well, was reactive at  endoscopy.   IMPRESSION:  Normal esophagus, small hiatal hernia, otherwise normal  stomach.  Couple of tiny bulbar erosions, otherwise D1 and D2 appeared  normal.   DISCUSSION:  With acute on chronic anemia, no documentation of any GI  bleeding.   RECOMMENDATIONS:  1. Advance diet.  2. Outpatient colonoscopy in the next couple of weeks since he has not      had one within the past 10 years.  I would continue proton pump      inhibitor with Protonix 40 mg orally daily.  Okay to resume aspirin      81 mg orally daily.  At this point we do not have any evidence of      GI bleeding and it if felt to be clinically indicated would      certainly go along with resumption of Plavix as well, although the      risk of GI bleeding is increased certainly with either __________      Of course if he developed any evidence of overt bleeding, then this      approach will have to be altered.  Jonathon Bellows, M.D.  Electronically Signed     RMR/MEDQ  D:  02/19/2009  T:  02/19/2009  Job:  [id number]   cc:   Gerrit Friends. Dietrich Pates, MD, [facility]  [address]  [place], [place] [address]   Madelin Rear. Sherwood Gambler, MD  Fax: [phone]

## 2011-02-15 NOTE — Assessment & Plan Note (Signed)
[facility]                            CARDIOLOGY OFFICE NOTE   KINGSTYN, DERUITER                        MRN:          [id number]  DATE:04/08/2009                            DOB:          [birth year]    PRIMARY CARE PHYSICIAN:  Madelin Rear. Sherwood Gambler, MD   REASON FOR PRESENTATION:  Evaluate the patient with respiratory failure  and syncope.   HISTORY OF PRESENT ILLNESS:  The patient presents for followup after  hospitalization, during which he was found to have sinus pauses and  heart block.  He had a pacemaker placed.  At that time, he was also  placed in hospice for his progressive respiratory failure.  Since going  home, he has done relatively well.  He only wears his oxygen now at  night.  He ambulates with a walker in his house.  Hospices coming out  twice a week.  He has had no acute distress.  He has had no new cough,  PND, or orthopnea.  He has had no chest discomfort.  He has felt no  palpitations.  He has had no presyncope or syncope.  He has continued  chronic mild lower extremity edema.   PAST MEDICAL HISTORY:  Respiratory failure (secondary to pleural  effusion, severe COPD, ongoing tobacco abuse, and congestive heart  failure), intermittent heart block with sinus pauses (status post [employer] pacemaker by Dr. Graciela Husbands), hospital-induced delirium, chronic renal  insufficiency, chronic atrial fibrillation (no Coumadin secondary to GI  bleeding), diabetes mellitus, coronary artery disease (last  catheterization in 2009 demonstrated 3 vessel CAD, he underwent stenting  with right coronary artery with Promus drug-eluting stent.  He also had  stenting of a circumflex with drug-eluting stent previously),  cardiomyopathy (EF 40%), degenerative joint disease, hypertension,  hyperlipidemia, peptic ulcer disease, and peripheral vascular disease.   ALLERGIES:  SULFA, STATINS, and COUMADIN.   MEDICATIONS:  1. Potassium 20 mEq daily.  2. Zetia 10 mg  daily.  3. Imdur 30 mg b.i.d.  4. Lasix 80 mg b.i.d.  5. Omeprazole 20 mg daily.  6. Hydralazine 12.5 mg t.i.d.  7. Poly iron 150 mg t.i.d.  8. Uroxatral.  9. Glyset 50 mg t.i.d.  10.Glyburide 5 mg b.i.d.  11.Aspirin 81 mg daily.   REVIEW OF SYSTEMS:  As stated in HPI, otherwise negative for other  systems.   PHYSICAL EXAMINATION:  GENERAL:  The patient is examined while seated in  the wheelchair.  VITAL SIGNS:  Blood pressure 88/54, heart 81 and regular.  NECK:  No jugular distention at 90 degrees.  LUNGS:  Decreased breath sounds with few scattered coarse crackles and  wheezes, but no obvious dullness to percussion.  BACK:  No costovertebral angle tenderness.  CHEST:  Well-healed pacemaker pocket.  HEART:  PMI not appreciated, distant heart sounds, S1 and S2 within  normal limits, no S3, no murmurs.  ABDOMEN:  Obese, positive bowel sounds, unable to appreciate  organomegaly, no rebound, no guarding.  SKIN:  No rashes, no nodules.  EXTREMITIES:  2+ upper pulse, absent dorsalis pedis and post tibialis,  moderate bilateral ankle edema, dependent rubor.  NEUROLOGIC:  Grossly intact.   EKG, ventricular paced rhythm, underlying atrial fibrillation with  demand ventricular pacing.   ASSESSMENT AND PLAN:  1. Syncope.  He has had no further episodes.  He has had his pacemaker      checked and will have it appropriately followed.  No further      therapy is planned.  2. Tobacco abuse.  He is I do not think ever going to completely quit      smoking and we have had long discussions about this.  He      understands that he is end of life with his lung disease and he      will continue with hospice.  3. Coronary artery disease, having no ongoing angina.  No further      cardiovascular testing is suggested.  4. Atrial fibrillation.  He tolerates this rhythm and has not a      Coumadin candidate.  No further therapy is warranted.  5. Followup.  I will see the patient again in  October.      Rollene Rotunda, MD, [facility]  Electronically Signed    JH/MedQ  DD: 04/08/2009  DT: 04/09/2009  Job #: [id number]   cc:   Madelin Rear. Sherwood Gambler, MD

## 2011-02-15 NOTE — Consult Note (Signed)
NAMEXAI, FRERKING                 ACCOUNT NO.:  [id number]   MEDICAL RECORD NO.:  [id number]          PATIENT TYPE:  INP   LOCATION:  4739                         FACILITY:  [facility]   PHYSICIAN:  Felipa Evener, MD  DATE OF BIRTH:  1928/07/10   DATE OF CONSULTATION:  01/30/2009  DATE OF DISCHARGE:                                 CONSULTATION   REQUESTING PHYSICIAN:  Rollene Rotunda, MD, [facility]   REASON FOR CONSULT:  Dyspnea in the setting of a large left pleural  effusion.   HISTORY OF PRESENT ILLNESS:  Mr. Hallenbeck is a 77 year old lifelong smoker  with known coronary artery disease, congestive heart failure, who is  retired Hotel manager who presents with increasing shortness of breath x2  weeks.  CT scan reveals a large left pleural effusion.  Despite  aggressive diuresis with a negative I and O of 1700 mL in 14 hours he  continues to be shortness of breath.  His creatinine is noted to be 2.02  with a base creatinine of 1.63.  Due to his increasing shortness of  breath pulmonary was asked to evaluate for left thoracentesis.   PAST MEDICAL HISTORY:  1. Significant for congestive heart failure secondary to ischemic      cardiomyopathy with a known EF of 40%.  2. Coronary artery disease catheterized in the past which demonstrated      3 vessel coronary artery disease.  He has had stenting of the right      coronary artery in the past.  3. He has got chronic dyspnea.  4. He has got chronic renal insufficiency with baseline creatinine of      1.61.  5. He has got type 2 diabetes.  6. Colitis.  7. Hypertension.  8. Hyperlipidemia.  9. He also is anemic.  10.He has got chronic obstructive pulmonary disease and continues to      smoke on a daily basis.  11.Morbidly obese with a weight listed as 97.7 kilos.  12.He also has peptic ulcer disease with a GI bleed in April of 2009.  13.He has known peripheral vascular disease.  14.He has a history of gallstones status post cholecystectomy.  15.He has got chronic atrial fibrillation with intolerance of Coumadin      due to his gastrointestinal bleed.   HOME MEDICATIONS:  1. Consist of glyburide 5 mg p.o. b.i.d.  2. Isorbid 30 mg daily.  3. Glyset 50 mg daily.  4. Amitriptyline 50 mg q.h.s.  5. Zetia 10 mg daily.  6. Aspirin 81 mg daily.  7. Actos 30 mg daily.  8. Theo-Dur 100 mg daily.  9. Omeprazole 20 mg daily.  10.Plavix 75 mg daily.  11.Coreg 6.25 mg twice a day.  12.Spiriva 18 mcg inhaled daily.  13.Also has p.r.n. albuterol inhaler.  14.Lasix 40 mg b.i.d.   SOCIAL HISTORY:  He is retired from [employer].  He spent 34 years in  [employer].  His daughter is a patient care coordinator at [employer].  He does continue to smoke as much as 3 packs per day at  a time now down to 1 pack a day.   FAMILY HISTORY:  Noncontributory.   ALLERGIES:  SULFA and STATIN.   PHYSICAL EXAMINATION:  GENERAL:  A morbidly obese white male in moderate  respiratory distress at rest.  VITAL SIGNS:  Blood pressure 124/90, heart rate is 85, respiratory rate  is 20, sats are 100% on 5 liter nasal cannula.  T max was 96.0.  HEENT:  He is cushingoid in appearance.  No JVD is appreciated.  CHEST:  Decreased breath sounds on the left.  An expiratory wheeze in  the right lung throughout and left upper lung.  CARDIAC:  Heart sounds are regular.  12-lead reveals atrial fibrillation  and controlled ventricular rate.  ABDOMEN:  Distended and mildly tender.  GU:  With amber urine.  EXTREMITIES:  With edema and changes of peripheral vascular disease.   LABS:  Hemoglobin 8.1, hematocrit 24.6, platelets 173, WBC 8.0, sodium  139, potassium 4.4, chloride 99, CO2 33, BUN 39, creatinine 2.02,  glucose is 293, INR 1.2.  CT scan shows changes of emphysema and a large  left pleural effusion.   IMPRESSION AND PLAN:  1. Large left pleural effusion in a 77 year old with multiple medical      comorbidities.  We will undergo ultrasound  guided left      thoracentesis.  A long discussion was undertaken with the patient      and the family including the risks of pneumothorax, hemothorax and      respiratory failure.  He agreed to the procedure.  They have stated      that they do not want life support as per his living will although      he does not have a DNR officially in the chart.  2. Chronic obstructive pulmonary disease with continued tobacco abuse.      We will add Xopenex 0.63 mg daily, __________q.i.d. due to his      history of chronic obstructive pulmonary disease and his history of      chronic atrial fibrillation.  3. Renal insufficiency, per cardiology.  4. Coronary artery disease with atrial fibrillation, per cardiology.  5. Diabetes mellitus, per cardiology.      Devra Dopp, MSN, ACNP      Felipa Evener, MD  Electronically Signed    SM/MEDQ  D:  01/30/2009  T:  01/30/2009  Job:  [id number]   cc:   Bevelyn Buckles. Bensimhon, MD  Rollene Rotunda, MD, [facility]

## 2011-02-15 NOTE — Group Therapy Note (Signed)
[name], [name]                 ACCOUNT NO.:  [id number]   MEDICAL RECORD NO.:  [id number]          PATIENT TYPE:  INP   LOCATION:  IC04                          FACILITY:  [facility]   PHYSICIAN:  Dorris Singh, DO    DATE OF BIRTH:  07-Dec-1927   DATE OF PROCEDURE:  02/20/2009  DATE OF DISCHARGE:                                 PROGRESS NOTE   Patient is seen today stating that he feels better.  He is up in the  chair and looks clinically improved.  Urology has stated that he can  keep the Foley in.  He will do well.   PHYSICAL EXAMINATION:  VITAL SIGNS:  Will review.  GENERAL:  He is alert and oriented, more alert than he has been but is  confused.  HEART:  Regular rate and rhythm.  LUNGS:  Clear to auscultation with decreased breath sounds bilaterally.  ABDOMEN:  Soft, nontender, nondistended.  EXTREMITIES:  Positive pulses.  No edema, ecchymosis, or cyanosis.   White count is 8.1, hemoglobin 9.4, hematocrit 27.5, platelet count 271.  Sodium 139.  Potassium is corrected at 4.1.  His chloride is 99.  CO2  33.  Glucose 133.  BUN 25.  Creatinine 1.72.  His current BNP is 208.   ASSESSMENT/PLAN:  1. Anemia:  This was determined not to be from a gastrointestinal      bleed.  It is probably more from the hematuria, so gastrointestinal      after they did his EGD yesterday determined that they could not      find any type of source for this possible gastrointestinal      bleeding.  2. Renal failure:  This is resolving fully.  We will continue his IV      hydration.  Urinary tract infection will continue with antibiotic      therapy as well.  3. Also discussed with family the patient going back to the [facility].  The probably most available time would be on Monday.  He      is clinically stable and would be ready to go in the next 24 to 48      hours; however, due to the fact that he cannot be transferred to      the nursing home over the weekend, Monday he can be discharged      unless something catastrophic occurs.      Dorris Singh, DO  Electronically Signed     CB/MEDQ  D:  02/20/2009  T:  02/20/2009  Job:  [id number]

## 2011-02-15 NOTE — H&P (Signed)
[name], [name]                 ACCOUNT NO.:  [id number]   MEDICAL RECORD NO.:  [id number]          PATIENT TYPE:  INP   LOCATION:  3729                         FACILITY:  [facility]   PHYSICIAN:  Bevelyn Buckles. Bensimhon, MDDATE OF BIRTH:  05-Aug-1928   DATE OF ADMISSION:  07/19/2007  DATE OF DISCHARGE:  07/20/2007                              HISTORY & PHYSICAL   PRIMARY CARE PHYSICIAN:  Dr. Sherwood Gambler.   PRIMARY CARDIOLOGIST:  Dr. Antoine Poche.   CHIEF COMPLAINT:  Shortness of breath/abnormal cardiac enzymes.   HISTORY OF PRESENT ILLNESS:  [name] is a 77 year old male with a  history of moderate coronary artery disease by cath in 2005.  He  complains of approximately a 28-month history of increased PND,  orthopnea, and dyspnea on exertion.  He states that his symptoms are not  any worse today than they were last week, but he just felt he could not  take it anymore.  He has been sleeping very poorly and states he has  been up since midnight because he could not breathe when he tried to  sleep.  He called our office and was told to come to the emergency room.  In the emergency room, he received a dose of steroids and a nebulizer  and is breathing better.  [name] also complains of fatigue and he  feels the palpitations from his atrial fibrillation.  He denies chest  pain.  He is coughing and the cough is productive of a clear/whitish  sputum.  He also reports wheezing more than usual, especially today.  He  feels that his weight is up and down and is not consistently up  recently, but he says it may be up about 5 pounds.   PAST MEDICAL HISTORY:  1. Status post cardiac catheterization in September 2005 showing a      normal left main, LAD 40%, circumflex 30%, OM-1 40%, OM-2 and OM-3      okay, RCA with 80% mid and 75% distal lesions, medical therapy      recommended.  2. Status post echocardiogram, September 2008, showing an EF of 60%      with mildly increased left ventricular wall  thickness and trivial      aortic valvular regurgitation.  3. Diabetes.  4. Hypertension.  5. Hyperlipidemia.  6. Recent tobacco use.  7. Obesity.  8. Family history of coronary artery disease in his father.  9. History of pulmonary edema in September 2005 requiring intubation.  10.History of peptic ulcer/pancreatitis and cholelithiasis.  11.COPD.  12.Degenerative joint disease.  13.Peripheral vascular disease with an 80% right ICA, a 60% to 79%      left ICA and ABIs bilaterally 0.6.   SURGICAL HISTORY:  He is status post cardiac catheterization,  cholecystectomy and EGD.   SOCIAL HISTORY:  He lives in [place] with his wife and is retired  from the [employer].  He has a greater than 50 pack-year history of  tobacco use and quit September 12.  He denies alcohol or drug abuse.   FAMILY HISTORY:  His mother died at age 58  of old age and his father  died at age 29 of cancer, but had an MI in his 65s.  He had 1 brother  that died, but did not have any heart disease.   ALLERGIES:  He is allergic or intolerant to SULFA and STATINS.   CURRENT MEDICATIONS:  1. Metformin 500 mg b.i.d.  2. Imdur 30 mg a day.  3. Potassium 20 mEq a day.  4. Glyburide 5 mg t.i.d.  5. Zetia 10 mg a day.  6. Lasix 40 mg a day.  7. Coumadin 3 mg b.i.d.  8. Albuterol and Atrovent nebulizers (has not used recently).   REVIEW OF SYSTEMS:  He states that his weight has gone up and down, but  has not been consistently up.  He has had no fevers or chills.  There is  been no hemoptysis, hematemesis or melena.  He has chronic arthralgias.  The shortness of breath, cough  and wheezing are ascribe above.  A full  14-point review of systems is otherwise negative.   PHYSICAL EXAM:  VITAL SIGNS: Temperature is 98.3, blood pressure 145/70,  pulse 87, respiratory rate 20, O2 saturation 96% on room air.  GENERAL:  He is a well-developed elderly white male in no acute distress at rest.  HEENT:  Normal.  NECK:  There  is no lymphadenopathy or thyromegaly noted.  He has  positive bilateral bruits and JVD is 9 cm.  CV:  His heart is irregular in rate and rhythm with an S1 and S2 and no  significant murmur, rub or gallop is noted.  Distal pulses are decreased  in the lower extremities and intact in the upper extremities.  LUNGS:  He has bilateral rhonchi, but no wheeze is noted at this time.  SKIN:  No rashes or lesions are noted.  ABDOMEN:  Soft and nontender with active bowel sounds.  EXTREMITIES:  He has got trace edema.  MUSCULOSKELETAL:  He has got no joint deformity or effusion and no spine  or CVA tenderness.  NEUROLOGIC:  He is alert and oriented.  Cranial nerves II-XII are  grossly intact.   LABORATORY AND ACCESSORY CLINICAL DATA:  Chest x-ray shows left greater  than right effusions with left effusion slightly increased in size from  June 16, 2007.   EKG has atrial fibrillation, rate 74, with no acute ischemic changes.   Laboratory values are pending, but a BNP is 288.  Point of cares were  negative except for a troponin of 0.07 and CK-MB were 50/1.7 with a  troponin of 0.16.  BUN 26, creatinine 1.3, glucose 114.   IMPRESSION:  Shortness of breath:  We will admit and cycle cardiac  enzymes.  We will continue the Xopenex nebulizers.  He will have Mucinex  added to his medication regimen as well as a steroid taper.  He will be  on  intravenous Lasix for approximately 24 hours.  We will follow his  laboratories including his potassium and his kidney function carefully.  He will be continued on his other home medications.  Further evaluation  and treatment will depend on the results of the above testing and his  response to treatment.      Theodore Demark, PA-C      Bevelyn Buckles. Bensimhon, MD  Electronically Signed    RB/MEDQ  D:  07/19/2007  T:  07/21/2007  Job:  [id number]   cc:   Madelin Rear. Sherwood Gambler, MD

## 2011-02-15 NOTE — Assessment & Plan Note (Signed)
[facility]                             PULMONARY OFFICE NOTE   NAME:BAYNESRea, Reser                        MRN:          [id number]  DATE:08/21/2007                            DOB:          [birth year]    HISTORY:  [name] is a 77 year old man with a history of coronary  artery disease that has been medically managed with an intact left  ventricular function per echocardiogram in September of 2008. He also  has some mildly increased left ventricular wall thickness with  hypertension. He has a significant tobacco history and I saw him  recently in consultation on a hospital admission between October 16 and  October 17 for dyspnea. At that time, we suspected that airflow  limitation was at least a component of his shortness of breath and I  started him empirically on Spiriva once daily and Xopenex to use on an  as needed basis. He has since had pulmonary function testing and he is  here to review the results of these today as well as to review his  clinical status on the bronchodilators. He tells me that since his  hospitalization and initiation of Spiriva he feels a bit of improvement.  He is not quite back to his baseline from several months ago, but  Spiriva is helping him with his breathing. In particular, he feels like  he is sleeping better because he is not waking up short of breath. He is  not using Xopenex with any frequency.   PAST MEDICAL HISTORY:  1. Coronary artery disease status post catheterization in 2005, on      medical therapy.  2. Diabetes mellitus.  3. Hypertension.  4. Hyperlipidemia.  5. Tobacco abuse.  6. Obesity.  7. History of admission for pulmonary edema requiring endotracheal      intubation and ventilator management.  8. History of peptic ulcer disease and pancreatitis.  9. Degenerative joint disease.  10.Peripheral vascular disease.  11.Status post cholecystectomy and EGD.  12.Reported history of asthma which was  diagnosed as a young man when      he was in the [employer].  13.Allergic rhinitis.   ALLERGIES:  1. SULFA.  2. STATIN.   CURRENT MEDICATIONS:  1. Potassium chloride 20 mEq a day.  2. Zetia 10 mg daily.  3. Metformin XR 1000 mg b.i.d.  4. Isosorbide 30 mg b.i.d.  5. Glyburide 5 mg t.i.d.  6. Lasix 40 mg daily.  7. Spiriva one inhalation daily.  8. Coumadin as directed.  9. Nitroglycerine p.r.n.  10.Meclizine 25 mg p.r.n.  11.Xopenex two puffs q4 hours p.r.n. for shortness of breath.   SOCIAL HISTORY:  The patient lives in [place] with his wife. He was  in the [employer] and was in the [employer] and had numerous jumps. He  is a former smoker with a greater than 60 pack year total history. He  quit in September of this year. He denies any alcohol or drug abuse.   FAMILY HISTORY:  His mother died at age 25 without medical problems. His  father had cancer and  a myocardial infarction at age 55.   PHYSICAL EXAMINATION:  In general, this is an obese, elderly gentleman  who is in no distress on room air. Weight is 208 pounds, temperature  97.7, blood pressure 112/64, heart rate 91, SPO2 is 96% on room air.  HEENT: The posterior pharynx is erythematous and narrow. He does not  have any lesions.  NECK: Supple without lymphadenopathy or stridor.  LUNGS:  Are very distant with some mild bilateral end-expiratory  wheezes.  HEART: Irregularly irregular without murmur.  ABDOMEN: Is obese, soft, nontender with positive bowel sounds.  EXTREMITIES: Have some trace pretibial edema.   Pulmonary function testing was performed on November 18. This showed  evidence of mild to moderate airflow limitation without a bronchodilator  response. His lung volumes were difficult to assess due to technique.  His diffusion capacity was decreased and corrected when adjusted for  alveolar volume.   IMPRESSION:  Exertional dyspnea with a component of chronic obstructive  pulmonary disease based on  clinical history and pulmonary function  testing. He has had a good clinical response to Spiriva.   PLAN:  1. I will continue his Spiriva one inhalation daily.  2. He will continue Xopenex on an as needed basis.  3. Will perform a walking oxymetry today to ensure that he is not      having occult desaturation.  4. I will followup with [name] in three months to assess his      clinical status or sooner if he has any difficulty in the interim.     Leslye Peer, MD  Electronically Signed    RSB/MedQ  DD: 08/21/2007  DT: 08/22/2007  Job #: [id number]   cc:   Madelin Rear. Sherwood Gambler, MD  Rollene Rotunda, MD, [facility]

## 2011-02-15 NOTE — Discharge Summary (Signed)
[name], [name]                 ACCOUNT NO.:  [id number]   MEDICAL RECORD NO.:  [id number]          PATIENT TYPE:  INP   LOCATION:  IC04                          FACILITY:  [facility]   PHYSICIAN:  Skeet Latch, DO    DATE OF BIRTH:  02-26-1928   DATE OF ADMISSION:  01/17/2008  DATE OF DISCHARGE:  04/18/2009LH                               DISCHARGE SUMMARY   DISCHARGE DIAGNOSES:  1. Melena secondary to chronic ulcer.  2. Type 2 diabetes or hypoglycemia.  3. History of coronary artery disease.  Recent stent placement.  4. History of chronic obstructive pulmonary disease.  5. History of hyperlipidemia.  6. History of pulmonary edema.  7. History of peptic ulcer disease.  8. History of cholelithiasis.  9. History of degenerative joint disease.  10 History of peripheral vascular disease.   BRIEF HOSPITAL COURSE:  This is a 77 year old male history with history  of coronary artery disease originally discharged from [facility] in  March 2009 after acute pulmonary edema associated with non-ST elevated  myocardial infarction.  The patient subsequently underwent a stent  placement in the mid-RCA, proximal RCA and circumflex arteries.  The  patient is doing fairly well until wife states he became very weak and  now notes that he had black, tarry stools.  The patient noticed this  after his stent placement.  The patient was placed on Coumadin, Plavix,  and Aspirin, and then subsequently his stools lightened in color,  subsequently became dark again.  The patient did have a scheduled  appointment with Cardiology, Dr. Antoine Poche, went to his office, had blood  work done and it was noted to be hyponatremic, hypoglycemic and anemic.  [facility] was called regarding the patient being admitted to the  hospital for the above findings and for his lethargy.  The patient was  seen in the emergency room.  Initial lab showed a sodium of 130,  potassium of 5, chloride 97, CO2 26, glucose 698, BUN  55, creatinine 2.  White count was 7.9, hemoglobin 8.1, hematocrit 24, platelet count was  152.   The patient was admitted for possible gastrointestinal bleed.  His  Coumadin was discontinued.  The patient was left on his Plavix and  aspirin.  He was kept n.p.o. and gastroenterology was consulted.  The  patient also had an elevated blood sugar per cardiology of 698.  He was  placed on a glucomander and subsequently placed on sliding scale and his  blood sugars came down to normal range.  He is also hyponatremic.  He  was cautiously hydrated.  Also his sodium came to the normal range.  Secondary to his anemia and melena, gastroenterology did an upper  endoscopy and saw that the patient had a duodenal ulcer.   Findings per EGD showed:  1.  Patchy erythema with occasional erosion  in the antrum, without evidence of ulcer.  Scant amount of old blood was  seen in the antrum.  2.  A 6-8 mm duodenal bulb seen at the junction of  V1 and V2 with no red, intermittent spots  of exposure to vessel.  Scant  amount of old blood seen.  Diagnosis was duodenal ulcer as the likely  source of his melena, _________.  It was decided to keep the patient on  his aspirin and Plavix because of the benefits of him adding a recent  stent placement.  Pyloric serology was taken and the patient was placed  on Protonix twice daily.  His diet was advanced and it is recommended  that the patient to get a colonoscopy in 12-18 months.  He needs to be  on aspirin and Plavix for a year and gastroenterology recommended  considering rescoping him in 3 weeks.   The patient has improved dramatically over the next few days.  At this  time the patient can be discharged to home.  Subsequently, of note ,the  patient did have some cramping in his legs and Doppler done of his lower  extremities that show no evidence of DVT.  Did show some partially  elevated right heart pressures.  Some other radiologic studies, the  patient had a  chest x-ray performed which showed cardiomegaly, pulmonary  vascular congestion.  CT of his abdomen and pelvis shows:  1.  Free  stool in his colon, questionable on palpitation.  2.  __________margins  of the liver, cannot exclude cirrhosis.  3.  Vascular calcifications  versus nonobstructive calculi, bilateral kidney.  4.  Bibasilar pleural  effusions and atelectasis.  5.  The pelvis showed no acute intrapelvic  abnormalities.   DISCHARGE MEDICATIONS:  1. Imdur 20 mg twice a day.  2. Coreg 3.125 twice daily.  3. Glyburide 5 mg 3 times a day.  4. Zetia 10 mg daily.  5. Lasix 40 mg daily.  6. Potassium chloride 20 mEq daily.  7. Aspirin 81 mg daily.  8. Plavix 75 mg daily.  9. Nitroglycerin 0.04 mg as needed.  10.Xopenex breathing treatment as needed.  11.Protonix 40 mg twice a day.  12.Remeron 15 mg at bedtime.  13.Actos 45 mg daily.   LABORATORY DATA:  Hemoglobin 10.3, hematocrit 29.8.  Blood cultures so  far were negative.  Last troponin was 0.05.  CK MB is 2.5.  Total  creatinine kinase is 63.  Hemoglobin A1c was 8.8.  Sodium 133, potassium  3.9, chloride 99, CO2 29, glucose 202, BUN 37, creatinine 1.48.   CONDITION ON DISCHARGE:  Stable.   DISPOSITION:  The patient is discharged to home with wife.   DISCHARGE INSTRUCTIONS:  The patient to maintain a low sodium, heart  healthy diet.  Increase his activity slowly, and to follow up with Dr.  Sherwood Gambler in 5-7 days.  Follow up with Dr. Cira Servant in 1 month.  He is to  return to the emergency room if he notes any increasing symptoms of  bleeding or chest pain.  The patient and his wife understood the  instructions.      Skeet Latch, DO  Electronically Signed     SM/MEDQ  D:  01/19/2008  T:  01/19/2008  Job:  [id number]   cc:   Madelin Rear. Sherwood Gambler, MD  Fax: [phone]

## 2011-02-15 NOTE — H&P (Signed)
[name], [name]                 ACCOUNT NO.:  [id number]   MEDICAL RECORD NO.:  [id number]          PATIENT TYPE:  EMS   LOCATION:  MAJO                         FACILITY:  [facility]   PHYSICIAN:  Bevelyn Buckles. Bensimhon, MDDATE OF BIRTH:  03/24/1928   DATE OF ADMISSION:  01/29/2009  DATE OF DISCHARGE:                              HISTORY & PHYSICAL   PRIMARY CARE PHYSICIAN:  Dr. Elfredia Nevins.   CARDIOLOGIST:  Dr. Rollene Rotunda.   REASON FOR ADMISSION:  Progressive dyspnea in the setting of heart  failure and a large left pleural effusion.   HISTORY OF PRESENT ILLNESS:  [name] is a 77 year old male with multiple  medical problems including coronary artery disease status post previous  stenting, CHF with ejection fraction of 40%, COPD with ongoing tobacco  use, chronic renal insufficiency, chronic atrial fibrillation, and  anemia.   He saw Dr. Antoine Poche last week with acute on chronic dyspnea and  increased lower extremity edema.  His Lasix was increased and he was  told to keep his legs elevated.  Since that time, he has had progressive  dyspnea and edema to the point where he cannot walk across the room.  He  has also had orthopnea.  He has been very fatigued.  He denies any chest  pain.  His symptoms got so bad that he finally came to the ER today.  His BNP was mildly elevated at 246.  His chest x-ray showed mild  congestive heart failure with a very large left pleural effusion and  atelectasis of his left lung.  He is now being admitted for further  treatment of this.   REVIEW OF SYSTEMS:  He denies any fevers, chills or cough.  His weight  has been relatively stable.  He has not had any bleeding.  He does have  chronic atrial fibrillation.  He has been intolerant of Coumadin in the  past due to a previous GI bleed.  He has not had any focal neurologic  symptoms.  He did quit smoking previously but unfortunately over the  past few months has gone back to smoking and is now smoking  about a  third of a pack a day.   REVIEW OF SYSTEMS:  All systems negative except as mentioned in the HPI  and problem list.   PROBLEM LIST:  1. Congestive heart failure secondary to ischemic cardiomyopathy.      Most recent echocardiogram reports an EF of 40%.  2. Coronary artery disease.  Last catheterization in April 2009      demonstrated three-vessel coronary artery disease.  He underwent      stenting of the right coronary artery with PROMUS drug-eluting      stent as well as stenting of the circumflex with a drug-eluting      stent.  3. Chronic dyspnea.  4. Renal insufficiency.  5. Type 2 diabetes.  6. Hypertension.  7. Hyperlipidemia.  8. Anemia.  9. Chronic obstructive pulmonary disease with ongoing tobacco use.  10.Obesity.  11.Peptic ulcer disease with gastrointestinal bleed in April 2009.  12.Peripheral vascular disease.  13.History  of gallstones status post cholecystectomy.  14.Chronic atrial fibrillation to his problem list.  Intolerance of      COUMADIN due to previous gastrointestinal bleed.   CURRENT MEDICATIONS:  1. Glyburide 5 mg p.o. b.i.d.  2. Isosorbide 30 a day.  3. Glyset 50 mg a day.  4. Amitriptyline 50 mg at bedtime.  5. Zetia 10 mg a day.  6. Aspirin 81 mg a day.  7. Actos 30 a day.  8. Theo-Dur 24 100 mg a day.  9. Omeprazole 20 mg a day.  10.Plavix 75 a day.  11.Coreg 6.25 mg twice a day.  12.Spiriva.  13.He also has an albuterol inhaler.  14.Lasix 40 b.i.d.   SOCIAL HISTORY:  He lives at home with his wife.  His daughter is a  patient Gaffer here at [facility].  He is retired from  [employer] with a history of tobacco use one pack per day times 50 years.  Previously quit but now started again.   FAMILY HISTORY:  Noncontributory.   ALLERGIES:  SULFA and STATINS.   PHYSICAL EXAM:  CONSTITUTIONAL:  He is a chronically ill-appearing male  sitting in a wheelchair.  He is in no acute distress.  VITAL SIGNS:  Blood pressure is  130/82, heart rate 91, weight 219,  saturation 94% on room air, 100% on 2 liters.  HEENT:  Normal except for poor dentition.  NECK:  Thick and supple.  Hard to assess JVP.  Does appear mildly  elevated.  Carotids are 2+ bilaterally.  Unable to appreciate any  bruits.  There is no lymphadenopathy or thyromegaly appreciated.  CARDIAC:  PMI is nonpalpable.  He is irregular with a 2/6 systolic  ejection murmur at the left sternal border.  I do not hear a gallop.  LUNGS:  Have markedly diminished air movement throughout, left greater  than right.  There is no wheezing.  ABDOMEN:  Obese, nontender, nondistended.  No obvious  hepatosplenomegaly.  No bruits.  No masses.  EXTREMITIES:  Warm.  There is no cyanosis or clubbing.  He has 2 to 3+  edema bilaterally.  No rash.  NEURO:  Alert and oriented x3.  Cranial nerves II-XII intact.  Moves all  four extremities without difficulty.  Affect is pleasant.   LABS:  Show white count of 8.4, hemoglobin 8.8 with a hematocrit 26.6,  platelets of 186,000.  Sodium 136, potassium 4.0, BUN of 43, creatinine  2.0.  Has a BNP of 246, troponin is less than 0.01.  Chest x-ray shows  mild CHF and cardiomegaly.  There is a large left pleural effusion.   ASSESSMENT:  1. Acute on chronic systolic heart failure.  2. Large left-sided pleural effusion which I suspect is the major      source of his symptoms.  3. Coronary artery disease status post previous stenting.  4. Chronic atrial fibrillation.  5. Ischemic cardiomyopathy with an EF 40%.  6. Chronic obstructive pulmonary disease with ongoing tobacco use.  7. Chronic anemia.  8. Chronic renal insufficiency.   PLAN/DISCUSSION:  As above, I think the major source of his symptoms is  his left pleural effusion.  We will admit him to the hospital.  We will  get a noncontrast CT to rule out underlying malignancy and we will  contact pulmonary for thoracentesis.   I do think he has also some overlying heart  failure.  We will begin IV  diuresis.  He also may need a transfusion for his anemia.  I would  consider adding hydralazine and nitrates as his blood pressure  tolerates.   I did counsel him to stop smoking.  Unfortunately, I do not think he is  that receptive to it at this time.      Bevelyn Buckles. Bensimhon, MD  Electronically Signed     DRB/MEDQ  D:  01/29/2009  T:  01/29/2009  Job:  [id number]   cc:   Rollene Rotunda, MD, [facility]  Madelin Rear. Sherwood Gambler, MD

## 2011-02-15 NOTE — Consult Note (Signed)
[name], [name] NO.:  [id number]   MEDICAL RECORD NO.:  [id number]          PATIENT TYPE:  INP   LOCATION:  2006                         FACILITY:  [facility]   PHYSICIAN:  Antonietta Breach, M.D.  DATE OF BIRTH:  [birth year]   DATE OF CONSULTATION:  02/06/2009  DATE OF DISCHARGE:                                 CONSULTATION   REQUESTING PHYSICIAN:  Bevelyn Buckles. Bensimhon, M.D.   REASON FOR CONSULTATION:  Psychosis agitation.   HISTORY OF PRESENT ILLNESS:  [name] is a 77 year old male admitted  to the [facility] on the January 29, 2009, due to congestive  heart failure.  [name]  has been demonstrating approximately 5 days  of progressive agitation.  The agitation has presented in a waxing and  waning fashion.  It is accompanied by severe confusion, clouding of  consciousness, as well as delusions.  He has been threatening to his  family and tried to strike out at his daughter.  This is very  uncharacteristic of him.  He also has been displaying waxing and waning  impaired judgment, impaired orientation and impaired memory.  These  mental symptoms have been correlated with the worsening of his general  medical condition.  Efforts to control him psychosocially have failed.   PAST PSYCHIATRIC HISTORY:  [name] does not have a history of these  type of symptoms before.  In review of the past medical record in April  2009, Remeron 15 mg daily was listed in his record.   SOCIAL HISTORY:  [name] is married.  His daughter has been visiting  him in hospital.  He is retired.  He does not use alcohol or illegal  drugs.  He spent 21 years in the [place] in [employer].  He  retired after 2 years in [place].  At the time that the undersigned  examined [name], he was in a relatively clear period and was able to  state that he is haunted by memories of [place].   PAST MEDICAL HISTORY:  Congestive heart failure.   MEDICATIONS:  His MAR is  reviewed.  1. Elavil 50 mg q.h.s.  2. Xanax 0.25 mg b.i.d. p.r.n.  3. Haldol 2 mg q.2 h. p.r.n.   ALLERGIES:  SULFA, CONTRAST MEDIA AND STATINS.   LABORATORY DATA:  Head CT without contrast showed no acute  abnormalities.  There was the presence of atrophy.  He also had chronic  ischemic disease white matter changes.   EKG QTC on January 29, 2009 was 550 milliseconds.   Sodium 136, BUN 40, creatinine 1.37.   WBC 8.3, hemoglobin 8.3, platelet count 207.   REVIEW OF SYSTEMS:  [name] is not able to provide much of this.  The  rest is gleaned from the staff medical record and past medical record.  CONSTITUTIONAL, HEAD, EYES, EARS, NOSE AND THROAT, MOUTH NEUROLOGIC,  PSYCHIATRIC, CARDIOVASCULAR, RESPIRATORY, GASTROINTESTINAL,  GENITOURINARY, SKIN, MUSCULOSKELETAL, HEMATOLOGIC, LYMPHATIC AND  METABOLIC:  All unremarkable.   EXAMINATION:  VITAL SIGNS:  Temperature 97.5, pulse 81, respiratory rate  22, blood pressure 104/48.  GENERAL APPEARANCE:  [name] is an elderly male lying in a supine  position in his hospital bed with no abnormal involuntary movements.   MENTAL STATUS EXAM:  [name] is alert; however, he does have some  clouding of consciousness.  His eye contact is good.  His affect is  anxious; mood is anxious.  On orientation testing at the time of the  exam, he is very much oriented to all spheres.  His memory is intact for  remote events.  Also, he is able to name 2/3 visual objects in 5  minutes.  His fund of knowledge and intelligence are grossly within  normal limits.  His speech is soft, but does involve normal rate and  prosody.  There is no dysarthria.  Thought process is logical, coherent,  goal-directed.  Thought content:  He has no thoughts of harming himself  or others.  There are no delusions or hallucinations at the time of the  exam.  His insight is poor.  His judgment is mildly impaired at the time  of the exam.   ASSESSMENT:  AXIS I:  1. 293.00  delirium not otherwise specified.  [name] does have a      number of factors contributing to his delirium.  He has a case of      acute congestive heart failure, as well as anemia.  He also is on      Elavil which has strong anticholinergic effects.  2. 293.84 anxiety disorder not otherwise specified, rule out post-      traumatic stress disorder.  AXIS II:  None.  AXIS III:  See past medical history.  AXIS IV:  General medical.  AXIS V:  30.   The undersigned discussed the case with the cardiology attending.   RECOMMENDATIONS:  1. Would discontinue the Elavil.  2. For severe agitation would utilize Ativan 1/2 to 2 mg p.o. IM or IV      q.4 h. p.r.n.  3. If [name] has return of delusions, hallucinations, confusion,      would start Zyprexa 5 mg p.o. or IM q.h.s. and utilize the Ativan      as described above for breakthrough agitation.  4. If his delirium pattern continues and the Zyprexa must be started,      keep in mind that the duration of delirium can vary and that a      dosage of 5-10 mg of Zyprexa will likely be adequate for eventually      reducing delirium symptoms.  5. In the meantime, use Ativan for breakthrough agitation as described      above.  6. Would keep his environment as quiet as possible and would keep      memory and orientation cues in the room, providing low stimulation      ego support.   DISCUSSION:  1. Stopping the Elavil alone may be enough to eliminate the delirium      symptoms.  2. However, he may be a candidate for starting Zoloft at 25 mg q.a.m.      after his delirium has cleared, in order to treat post-traumatic      stress disorder symptoms.  3. His PTSD symptoms will need to be re-evaluated once he recovers      from delirium.  If he is a candidate for      Zoloft, the Zoloft can be slowly titrated up to the target dose of      100 mg  q.a.m.  4. Regarding side effects, if the Zyprexa is started, would monitor      for stiffness or  other extrapyramidal side effects.      Antonietta Breach, M.D.  Electronically Signed     JW/MEDQ  D:  02/08/2009  T:  02/08/2009  Job:  [id number]

## 2011-02-15 NOTE — Consult Note (Signed)
NAMELUCAN, RINER                 ACCOUNT NO.:  [id number]   MEDICAL RECORD NO.:  [id number]          PATIENT TYPE:  INP   LOCATION:  A301                          FACILITY:  [facility]   PHYSICIAN:  Pricilla Riffle, MD, FACCDATE OF BIRTH:  Jun 16, 1928   DATE OF CONSULTATION:  02/18/2008  DATE OF DISCHARGE:  02/18/2008                                 CONSULTATION   IDENTIFICATION:  Mr. Grandfield is a 77 year old gentleman, whom we are  asked to see regarding increased shortness of breath and lower extremity  swelling.   HISTORY OF PRESENT ILLNESS:  The patient is well-known to our service.  He is status post non-ST elevation MI in March 2009, when he presented  with dyspnea.  He underwent cardiac catheterization and he had a  PTCA/stent (drug-eluting) x2 to the RCA and x1 to the OM.  Postprocedure  complicated by an upper GI bleed for which he was transfused.   The patient presented to Dr. Sharyon Medicus office complaining of about a week  or two history of increased shortness of breath and lower extremity  swelling.  His Lasix was increased to 40 mg t.i.d.  This did not resolve  things and he was placed on Zaroxolyn.  Unfortunately, the patient  stopped the Lasix and continued on the Zaroxolyn.  He did this for a day  and then presented to the emergency room.   Here in the hospital, he has been treated with IV steroids, IV Lasix,  and when his blood pressure was low, when his creatinine increased are  in 517, given 1.5 liters half normal saline.   His breathing is improved.  His ankles still are little swollen.  He  denies chest pain (note he never had chest pain back in March).   On talking to him, he said he has been watching his salt intake at home.   ALLERGIES:  SULFA and STATINS to which he is intolerant, question IV  CONTRAST DYE.   PAST MEDICAL HISTORY:  1. Coronary artery disease status post NSTEMI in March 2009.  Cardiac      catheterization showed a 30% LAD, 95% ramus, 90% and 95%  RCA      lesions, underwent PTCA stent to the RCA and ramus with drug-      eluting stents 2 (with promise drug-eluting stents 2).  2. History of systolic and diastolic CHF.  Echocardiogram in March      showed an LVEF of 40% with akinesis of the posterior lateral wall      inferoseptal wall.  Mild MR.  3. History of atrial fibrillation.  Coumadin was discontinued because      of recent GI bleed.  4. History of peripheral artery disease, ABI bilaterally 0.6.  Refused      to surgery in the past.  5. History of CV disease greater than 80% right ICA stenosis and 60-      70% left ICA stenosis.  The patient also has refused surgery.  6. COPD.  7. Diabetes.  8. Hypertension.  9. History of pancreatitis.  10.Status post cholecystectomy.  11.DJD.  12.Chronic renal insufficiency.  13.Increased cholesterol.  14.History of upper GI bleed.  Coumadin was discontinued in April.   SOCIAL HISTORY:  The patient is married, lives with his wife.  He has a  history of 100-pack-year history of smoking, quit in September 2008,  does not drink.   FAMILY HISTORY:  Negative for premature CAD.   REVIEW OF SYSTEMS:  All systems reviewed.  No shortness of breath,  dyspnea, orthopnea, or some PND.  Edema as noted; otherwise, negative to  the above problem except as noted.   PHYSICAL EXAMINATION:  GENERAL:  Currently, the patient is in no acute  distress.  VITAL SIGNS:  Blood pressure today has ranged 94-116/66-72, pulses in  the 80s, and temperature is 97.5.  O2 sat on 2 liters 97%.  HEENT:  Normocephalic and atraumatic.  EOMI.  PERRL.  Mucous membranes  are moist.  NECK:  JVP appears to be normal.  No thyromegaly.  LUNGS:  Relatively clear with crackles that clear with the cough.  CARDIAC:  Irregularly irregular.  S1 and S2.  No S3.  No significant  murmurs.  ABDOMEN:  Supple and nontender.  No definite hepatomegaly.  EXTREMITIES:  1+ edema bilaterally.  Feet warm.  NEUROLOGIC:  Alert and oriented  x3.  Cranial nerves II through XII are  grossly intact.  Moving all extremities.   LABORATORY DATA:  Chest x-ray shows improvement in interstitial edema,  still persistent left lower lobe effusion.  Is and Os on Feb 16, 2008  were minus 605 and Feb 17, 2008 were minus 545.  Note, he received Lasix  40 IV b.i.d. on Feb 16, 2008 n.p.o., then given IV fluids.  A 12-lead  EKG shows atrial fibrillation, 85 beats per minute, and nonspecific ST-T  wave changes.  Labs significant for hemoglobin of 10.5, BUN and  creatinine of 71 and 1.98.  Note, his creatinine was 1.48 on admission.  BNP on Feb 17, 2008, 157 and was 408 on admission, troponin less than  0.05 x2 for point of care markers.  BNP 228 down from 408.   IMPRESSION:  The patient is a 77 year old well known to service.  He had  approximately 1-week history of shortness of breath and lower extremity  swelling.  His medications were changed.  Zaroxolyn was added, but he  cut off the Lasix with this.  He presented with volume overload.  He has  been diuresed actually, given IV fluids for an increased creatinine.  On  exam, he still has some evidence of mild fluid increase.  Labs show no  evidence for acute ischemia, though he only had point-of-care markers  done.  Congestive heart failure in the setting of at least mild left  ventricular dysfunction.  No evidence for acute ischemia after recent  stents.  We would, however, check 1 set of markers.  He now may be a  little bit of prerenal.  Would recommend:  Continue Lasix, but would  change to 60 mg one time per day.  Hold Coreg and Imdur.  BP is now  marginal.  It can be reassessed this week as an outpatient.  Restart  Plavix and aspirin, and he has missed a couple of doses.  Continue  Zetia.   I will set followup for Wednesday here for labs.  Followup with Ihor Gully in cardiology clinic in [place] on Thursday.   MEDICATIONS:  Prior to admission:  1. Restoril 30 q.h.s.  2. FeSO4  of 325.  3. Zaroxolyn.  4. Coreg 3.125 b.i.d.  5. Centrum.  6. Actos 45.  7. Plavix 75.  8. Omeprazole 20.  9. Lasix 40 t.i.d., but the patient has stopped.  10.Zetia 10.  11.Klor-Con 20.  12.Aspirin 81.  13.Imdur 30 b.i.d.  14.Micronase 10  two daily.  15.Since admission Actos, Coreg, and Imdur held, also inadvertently      Zetia, Plavix, and aspirin held.      Pricilla Riffle, MD, [facility]  Electronically Signed     PVR/MEDQ  D:  02/18/2008  T:  02/19/2008  Job:  [id number]

## 2011-02-15 NOTE — Discharge Summary (Signed)
NAME:  [name], [name]                 ACCOUNT NO.:  [id number]   MEDICAL RECORD NO.:  [id number]          PATIENT TYPE:  INP   LOCATION:  6529                         FACILITY:  [facility]   PHYSICIAN:  Rollene Rotunda, MD, FACCDATE OF BIRTH:  08-16-1928   DATE OF ADMISSION:  12/25/2007  DATE OF DISCHARGE:  12/28/2007                         DISCHARGE SUMMARY - REFERRING   DISCHARGE DIAGNOSES:  Is acute pulmonary edema associated with a non-ST  elevated myocardial infarction with transfer from [facility]  hypertension, hyperglycemia with a history of diabetes that is poorly-  controlled, ongoing tobacco use, COPD, hyperlipidemia with intolerance  to statins, longstanding persistent atrial fibrillation with a rapid  ventricular rate on admission, now controlled and subtherapeutic INR on  admission at [facility].  DYE ALLERGY, chronic kidney disease, history  as noted previously.   PROCEDURES:  Cardiac catheterization on December 26, 2007 by Dr. Antoine Poche,  and on December 27, 2007 drug-eluting stents to the mid-RCA, proximal RC,  and circumflex by Dr. Juanda Chance.   SUMMARY OF HISTORY:  [name] is a 77 year old white male who was  admitted to [facility] on December 21, 2007 by [facility] with  extreme dyspnea and anxiety, whose onset was in the early morning of  admission.  The chest x-ray showed mild-to-moderate pulmonary edema, and  he initially improved with diuresis.  He could not elaborate on any  potential triggers.   PAST MEDICAL HISTORY:  His past medical history is notable for diastolic  congestive heart failure.  Last catheterization in 2005 showed mild-to-  moderate disease, continued tobacco use, peptic ulcer disease, prior  cholecystectomy, remote pancreatitis, DJD, diabetes, obesity.   LABORATORY:  At [facility], his admission weight was 93.9.  At  [facility], admission weight was 97.9.  At time of discharge from  [facility], discharge weight was 89.4.  At [facility] at the time of  discharge, H&H was 10.7 and 31.3, normal indices, platelets of 201, WBCs  11.6.  On the 25th, sodium was 134, potassium 4.7, BUN 36, creatinine  1.78, glucose 222.  At time of discharge, sodium was 137, potassium 3.7,  BUN 40, creatinine 1.63, glucose 146.  Hemoglobin A1c was elevated at  9.2.  Post procedure CK-MB was within normal limits.  EKG on the 27th  showed atrial fibrillation, left axis deviation, delayed R-wave,  nonspecific ST-T wave changes.   HOSPITAL COURSE:  The patient was initially treated and diuresed at  [facility].  He was transferred to [facility] for further evaluation.  He arrived on December 25, 2007 without difficulty, and on December 26, 2007  he underwent cardiac catheterization by Dr. Antoine Poche.  LV gram was not  performed.  He had extensive RCA disease, as well as a 30% to 40%  diffuse disease in the circumflex through 95% to RI, 30% to 40% proximal  RCA with diffuse irregularities.  Diabetes treatment program assisted  with management.  Cardiac rehab assisted with education and ambulation.  After review of the cath films, it was felt that he should undergo PCI  to the RCA  and possibly the OM.  On March 26 th, he was premedicated for  his dye allergy and underwent drug-eluting stenting to the proximal and  mid-RCA, as well as the OM, reducing of these lesions to 0% by Dr. Charlies Constable.  The research report placed him in the adept drug-eluting stent  protocol.  Friday, the 27th after review, Dr. Antoine Poche felt that the  patient could be discharged home.  It was noted that he would not use  ACE inhibitor secondary to his renal insufficiency but would start low-  dose Coreg and maybe consider an ACE in the future.  He is also  recommending rechecking a BMET in one week with Dr. Sherwood Gambler and copies  sent to Dr. Antoine Poche.  Dr. Antoine Poche explained the risks of potential  bleeding, in regards to being on aspirin, Coumadin and Plavix.  Dr.  Antoine Poche  stated that he would discuss with Dr. Sherwood Gambler the need for his  INR to be low therapeutic, and patient was given instructions to resume  his Coumadin in 48 hours and follow up with Dr. Sherwood Gambler.  Prior to  discharge, he was also re-seen by cardiac rehab, and tobacco cessation  consult was performed.   DISPOSITION:  The patient was advised to resume his Coumadin as  previously, but to begin this medication on Sunday.  He also received a  prescription for Plavix 75 mg daily for at least 1 year.  He received a  prescription for nitroglycerin 0.4 as needed, Imdur 30 mg b.i.d. as well  as Coreg 3.125 mg b.i.d.  He was advised to begin aspirin 81 mg daily,  glyburide 5 mg t.i.d., Zetia 10 mg daily, his multiple drops as  previously, and in the Lasix 40 mg daily, potassium as previously.  He  is advised to call Dr. Sherwood Gambler, to arrange a PT/INR for this Wednesday or  Thursday, maintain a blood pressure and sugar diary, to bring all  medicines and diary to all appointments.  To avoid smoking or tobacco  products.  He will follow up with Dr. Antoine Poche on January 17, 2008 at 3:15  p.m..  Wound care and activities are per supplemental sheet.   DISCHARGE TIME:  Fifty minutes.      Joellyn Rued, PA-C      Rollene Rotunda, MD, [facility]  Electronically Signed    EW/MEDQ  D:  12/28/2007  T:  12/28/2007  Job:  [id number]   cc:   Madelin Rear. Sherwood Gambler, MD  Rollene Rotunda, MD, [facility]

## 2011-02-15 NOTE — Discharge Summary (Signed)
[name], [name]                 ACCOUNT NO.:  [id number]   MEDICAL RECORD NO.:  [id number]          PATIENT TYPE:  INP   LOCATION:  3729                         FACILITY:  [facility]   PHYSICIAN:  Luis Abed, MD, FACCDATE OF BIRTH:  [birth year]   DATE OF ADMISSION:  07/19/2007  DATE OF DISCHARGE:  07/20/2007                               DISCHARGE SUMMARY   PROCEDURES:  None.   FINAL PRIMARY DISCHARGE DIAGNOSIS:  Shortness of breath, considered a  combination of acute-on-chronic diastolic congestive heart failure  (primary) and chronic obstructive pulmonary disease.   SECONDARY DIAGNOSES:  1. Coronary artery disease status post cardiac catheterization in 2005      showing a 40% LAD, 30% circumflex, OM-1 40%, RCA 80%, medical      therapy recommended.  2. Diabetes.  3. Hypertension.  4. Hyperlipidemia.  5. Recent tobacco use.  6. Obesity.  7. Family history of coronary artery disease.  8. History of pulmonary edema in 2005 requiring endotracheal      intubation and ventilator management.  9. History of peptic ulcer disease/pancreatitis.  10.Chronic obstructive pulmonary disease.  11.Degenerative joint disease.  12.Status post echocardiogram in September of 2008 showing an ejection      fraction of 60%.  13.History of peripheral vascular disease with an 80% right ICA, 60-      79% left ICA and bilateral ABI of 0.6.  14.Status post cholecystectomy and EGD.  15.Allergy or intolerance to SULFA and STATINS.   Time at discharge 40 minutes.   HOSPITAL COURSE:  [name] is a 77 year old male with a history of  noncritical coronary artery disease by cath.  He reports an approximate  90-month history of increased PND, dyspnea on exertion and orthopnea.  On  the day of admission, he finally decided he could not take it anymore  and called our office and was told to come to the emergency room.  He  denies chest pain but reports increased fatigue, as well as palpitations  and  cough.  He was at admitted for further evaluation.   His BNP was slightly elevated at 288, and his oxygen saturation was  slightly low.  He was given IV Lasix with improvement in his symptoms.  His potassium was supplemented, as well.  Initial cardiac enzymes showed  CK MBs within normal limits.  Troponin I was slightly elevated at  between 0.07 and 0.16.  Lipid profile showed a total cholesterol of 184,  triglycerides 124, HDL 37, LDL 122.  He was continued on Zetia.  His INR  on admission was 1.1, and he was continued on his home Coumadin dose,  but this will be increased at discharge.  He is not felt high risk  enough to need full cross coverage with heparin to a therapeutic  Coumadin level, and per the patient, his Coumadin level has been labile  in the past and requiring high doses to maintain a therapeutic level.   On July 20, 2007, [name] was evaluated by Dr. Antoine Poche and Dr.  Myrtis Ser.  It was felt that the contribution of COPD to  his symptoms was  unclear, so a pulmonary consult was called.  He was seen by Dr. Delton Coombes  who felt that the primary component was volume overload and pulmonary  edema in the setting of known COPD.  He had initially been started on  steroids, but it was not felt necessary to continue dose.  He had  Spiriva added to his medication regimen and is to use Xopenex as an  inhaler on a p.r.n. basis.  He will have PFTs and follow up with Dr.  Delton Coombes as an outpatient.  [name] was considered stable for discharge  on July 20, 2007 with outpatient follow up arranged.   DISCHARGE INSTRUCTIONS:  His activity level is to be increased as  tolerated.  He is to weigh himself daily and record the weight.  He is  to follow up with Dr. Delton Coombes on August 21, 2007 and get PFTs at 3  o'clock and see Dr. Delton Coombes at 4:30.  He is to see Dr. Antoine Poche on July 31, 2007 at 9:30.  He is to follow up with Dr. Sherwood Gambler as scheduled.   DISCHARGE MEDICATIONS:  1. Spiriva inhaler  daily.  2. Xopenex HFA 2 puffs q.i.d. p.r.n.  3. Metformin 500 mg b.i.d.  4. Imdur 30 mg a day.  5. K-Dur 20 mEq a day.  6. Glyburide 5 mg t.i.d.  7. Zetia 10 mg a day.  8. Lasix 40 mg a day.  9. Coumadin 5 mg 2 tablets daily or as directed.  He is to get a      Coumadin check on Monday.  INR at discharge is 1.1.      Theodore Demark, PA-C      Luis Abed, MD, [facility]  Electronically Signed    RB/MEDQ  D:  07/20/2007  T:  07/22/2007  Job:  [id number]   cc:   Leslye Peer, MD  Madelin Rear. Sherwood Gambler, MD

## 2011-02-15 NOTE — Cardiovascular Report (Signed)
[name], [name]                 ACCOUNT NO.:  [id number]   MEDICAL RECORD NO.:  [id number]          PATIENT TYPE:  INP   LOCATION:  6529                         FACILITY:  [facility]   PHYSICIAN:  Everardo Beals. Juanda Chance, MD, FACCDATE OF BIRTH:  [birth year]   DATE OF PROCEDURE:  12/27/2007  DATE OF DISCHARGE:  12/28/2007                            CARDIAC CATHETERIZATION   CLINICAL HISTORY:  [name] is 77 years old and has a history of  diastolic heart failure and coronary artery disease managed medically.  He was admitted to the hospital with congestive heart failure and atrial  fibrillation and positive troponins consistent with a non-ST-elevation  myocardial infarction.  He was studied by Dr. Antoine Poche yesterday and was  found to have a 95% stenosis in the mid-right coronary, a 90% stenosis  in the proximal right coronary, a 90% stenosis in the circumflex  marginal vessel, and about 70% stenosis in the proximal __________  as  well as other less obstructive disease.  He also has renal insufficiency  with a creatinine of 1.78.  He was scheduled for intervention today.  Dr. Antoine Poche discussed the use of drug-eluting stents prior the  procedure and the feeling was that he would not be on Coumadin for his  atrial fibrillation and that we could use drug-eluting stents if we they  were the best option.   PROCEDURE:  The procedure was performed via the left femoral artery  using an arterial sheath and 6-French preformed coronary catheters.  We  had some difficulty obtaining access.  There was hematoma a from the  previous day's procedure and access via the right femoral artery was not  feasible.  We are able to get in the artery with the help of a Doppler  needle.   We first approached the right coronary artery.  The patient was given an  Angiomax bolus and infusion and was given four chewable aspirin and was  given additional an 300-mg load of Plavix.  We used a 6-French JR-4  guiding catheter  with side holes.  We passed a Prowater wire down the  vessel without difficulty.  We predilated the lesion in the midvessel  and proximal vessel with a 2.25 x 20-mm Maverick, performing inflations  up to 10 atmospheres for 30 seconds.  We then deployed a 2.5 x 23-mm  Promus stent at the lesion site in the mid right coronary artery.  We  deployed this with one inflation of 14 atmospheres for 30 seconds.  We  then postdilated with a 2.75 x 15-mm Quantum Maverick, performing two  inflation up to 18 atmospheres for 30 seconds.   We then approached the lesion in the proximal right coronary artery.  We  had previously predilated the lesion.  We went in with a 3.5 x 18-mm  Promus stent and deployed this with one inflation of 14 atmospheres for  30 seconds.  We then postdilated with a 3.75 x 12-mm Quantum Maverick,  performing two inflation up to 16 atmospheres for 30 seconds.   We then approached the lesion in the circumflex artery.  We used  a CLS-4  6-French guiding catheter with side holes.  We passed a we Prowater wire  across the lesion.  After crossing the lesion, the site of the lesion  became totally obstructed.  We felt we were probably under a plaque that  caused this, although the wire appeared to move freely distally.  We  passed a PT-2 light support buddy wire next to the other wire and this  established some flow.  We then went in with a 2.25 x 15-mm over-the-  wire Maverick and performed two inflations up to 10 atmospheres for 30  seconds.  This reestablished flow.  We removed the Prowater wire.  We  then deployed a 2.5 x 18-mm Promus stent, deploying this with one  inflation up to 14 atmospheres for 30 seconds.  We crossed a moderate-  sized side branch with the stent.  We then postdilated the stent with a  2.75 x 12-mm Quantum Maverick, performing two inflations up to 16  atmospheres for 30 seconds.  Final diagnostics were then performed  through the guiding catheter.  The  patient tolerated the procedure well  and left the laboratory in satisfactory condition.  We used a total of  275 mL of contrast.   CONCLUSIONS:  1. Successful percutaneous coronary intervention of tandem lesions in      the mid and proximal right coronary artery with improvement in the      mid lesion from 95% to 0% using a Promus drug-eluting stent and      improvement in the proximal stenosis from 90% to 0% using a Promus      drug-eluting stents.  2. Successful percutaneous coronary intervention of the lesion in the      circumflex marginal vessel using a Promus drug-eluting stent with      improvement in sentinel narrowing from 90% to 0%.   DISPOSITION:  The patient was returned to the [facility] unit for  further observation.  I would recommend Plavix for at least a year.  Dr.  Antoine Poche indicated that he will probably not treat the patient with  Coumadin due to issues with compliance, but he felt we could get  compliance with Plavix.      Bruce Elvera Lennox Juanda Chance, MD, [facility]  Electronically Signed     BRB/MEDQ  D:  12/27/2007  T:  12/28/2007  Job:  [id number]   cc:   Madelin Rear. Sherwood Gambler, MD  Rollene Rotunda, MD, [facility]  Cardiopulmonary Lab

## 2011-02-15 NOTE — Discharge Summary (Signed)
NAMEJACKY, [name]                 ACCOUNT NO.:  [id number]   MEDICAL RECORD NO.:  [id number]          PATIENT TYPE:  INP   LOCATION:  A316                          FACILITY:  [facility]   PHYSICIAN:  Lonia Blood, M.D.      DATE OF BIRTH:  02-Feb-1928   DATE OF ADMISSION:  02/18/2009  DATE OF DISCHARGE:  05/24/2010LH                               DISCHARGE SUMMARY   PRIMARY CARE PHYSICIAN:  Madelin Rear. Sherwood Gambler, MD   DISCHARGE DIAGNOSES:  1. Severe anemia most likely secondary to gastrointestinal bleeds.  2. Coronary artery disease.  3. Atrial fibrillation with pauses.  4. Persistent left pleural effusions.  5. Diabetes.  6. Early dementia.  7. Transient respiratory distress.  8. Hypertension.  9. Dyslipidemia.  10.Peptic ulcer disease.  11.Peripheral vascular disease.  12.Acute-on-chronic renal failure secondary to obstructive uropathy      most likely.  13.Chronic obstructive pulmonary disease.   DISCHARGE MEDICATIONS:  1. Lasix 20 mg p.o. daily.  2. Coreg 6.25 mg p.o. b.i.d.  3. Uroxatral 10 mg p.o. daily.  4. Hydralazine 12.5 mg t.i.d.  5. Zyprexa 5 mg nightly.  6. Zetia 10 mg daily.  7. Imdur 30 mg daily.  8. Spiriva 18 mcg 1 capsule inhale daily.  9. Aspirin 81 mg daily.  10.Micronase 1 tablet daily.  11.Nu-Iron 150 mg t.i.d. with meals.  12.Glyset 50 mg p.o. t.i.d.   DISPOSITION:  The patient has been offered a skilled facility, but he  has turned it down.  He insists on going home even though his wife is  currently in the hospital.  We have had PT, OT evaluate the patient and  based on their recommendation, we will discharge him home with some home  health.   Procedures performed this admission include an abdominal x-ray on May  19, that showed improved left-sided aeration with persistent pleural  thickening and adjacent airspace disease. An abdominal upright film  showed no convincing evidence of bowel obstruction or free  intraperitoneal air.  There was question of  constipation.  An EGD also  performed by Dr. Roetta Sessions that showed normal esophagus, small  hiatal hernia.  Otherwise, normal stomach.  There were a couple of tiny  bulbar erosions.  Recommendation for outpatient colonoscopy.   CONSULTATIONS:  1. GI, Dr. Roetta Sessions.  2. [facility], Dr. Valera Castle.   BRIEF HISTORY AND PHYSICAL:  Please refer to dictated history and  physical on May 19, by Dr. Dorris Singh.  In short, however, this 77-  year-old male was admitted from [facility].  He was having generalized  weakness and found to have a hemoglobin of 6.7.  He was complaining  mainly weakness again in the ED without any other problems.  No active  bleeding was noted.  He is known to have coronary artery disease and  chronic anemia.  He was subsequently admitted for further management.   HOSPITAL COURSE:  1. Profound anemia.  The patient's hemoglobin has been gradually      declining.  He was at [facility] 2 weeks earlier and his hemoglobin  was 8.4. Here, he was guaiac negative.  GI was consulted.  He had      an EGD done that showed no source of bleed.  It was recommended      that he have an outpatient colonoscopy.  In the meantime, he was      transfused 2 units of packed red blood cells and his hemoglobin      stayed up.  He had subsequent followup of his hemoglobin that      stayed in December  between 9 and 10.  He was subsequently      discharged home then after his hemoglobin had remained stable.  2. Coronary artery disease.  The patient has cardiomyopathy with an EF      of about 40%.  Cardiology was consulted.  He was followed closely      in the hospital.  He is to follow up at the [facility]      office on June 2, for further management.  3. Atrial fibrillation.  The patient is not a candidate for Coumadin      at this point with his severe anemia, however, his rate was      controlled and as indicated Cardiology followed the patient       closely.  4. GERD.  We will continue his PPI in the patient  as much as possible      while he is in here.  5. Dementia, this is early.  No agitation noted.  6. Acute-on-chronic renal failure.  The patient's BUN and creatinine      were elevated on admission, most likely prerenal.  His creatinine      was 2.2.  However, with hydration and followup care, his creatinine      improved prior to discharge home.  It was 1.67 on the day of      discharge.  7. UTI.  The patient has some UTI also transiently.  He was initially      on vancomycin IV with transition to nitrofurantoin at the time of      discharge.  Otherwise, the patient is profoundly debilitated and      really should have gone to a skilled facility again, but he insists      on going home, so he is going home with home health PT, OT.      Lonia Blood, M.D.  Electronically Signed     LG/MEDQ  D:  03/18/2009  T:  03/18/2009  Job:  [id number]

## 2011-02-15 NOTE — H&P (Signed)
[name], [name]                 ACCOUNT NO.:  [id number]   MEDICAL RECORD NO.:  [id number]          PATIENT TYPE:  EMS   LOCATION:  ED                            FACILITY:  [facility]   PHYSICIAN:  Skeet Latch, DO    DATE OF BIRTH:  1928-07-26   DATE OF ADMISSION:  01/17/2008  DATE OF DISCHARGE:  LH                              HISTORY & PHYSICAL   CHIEF COMPLAINT:  Weakness, and black stools.   HISTORY OF PRESENT ILLNESS:  This is a 77 year old Caucasian male with a  history of coronary artery disease who was recently discharged from  [facility] on December 28, 2007 after acute pulmonary edema  associated with non-ST elevated myocardial infarction.  The patient was  initially seen at [facility] and transferred to [facility] for  cardiac catheterization and subsequently underwent a stent placement to  the mid-RCA, proximal RC and circumflex arteries by Dr. Juanda Chance at [facility].  The patient underwent those procedures and was discharged.  He  states he was doing fairly well until the past week, when he became very  weak and started to notice black stools.  Per wife, she noticed after  stent placement the patient has been on Coumadin, Plavix, and aspirin,  and had some black stools after the procedure, and subsequently his  stools started to not be as dark, but over the past week they have  gotten dark once again.  The patient had a scheduled appointment with  cardiology today, with Dr. Antoine Poche.  I was called by Dr. Antoine Poche, who  stated that the patient was in his office and lab work showed the  patient was hyponatremic, hyperglycemic, and anemic.  He also mentioned  that he did a Hemoccult on the patient, and it was positive.  He asked  [place] to evaluate the patient at [facility]  Upon exam, the  patient is not complaining of chest pain, nausea, vomiting, or diarrhea.  The only complaint the patient has at this time is weakness.   PAST MEDICAL HISTORY:  1.  Status post stent placement on December 27, 2007.  2. History of type 2 diabetes.  3. History of hypertension.  4. History of hyperlipidemia.  5. Obesity.  6. History of pulmonary edema.  7. History of peptic ulcer disease.  8. History of pancreatitis.  9. History of cholelithiasis.  10.History of COPD.  11.History of degenerative joint disease.  12.History of PVD.   SOCIAL HISTORY:  1. The patient is status post stent placement.  2. Cholecystectomy.  3. EGD.   SOCIAL HISTORY:  The patient lives with his wife.  Retired from [employer].  Admits to a 50 pack-year history of tobacco use, quit on  June 15, 2007.   ALLERGIES:  1. SULFA.  2. STATINS.   CURRENT HOME MEDICATIONS:  1. Imdur 30 mg twice a day.  2. Coreg 3.125 mg twice a day.  3. Glyburide 5 mg 3 times a day.  4. Zetia 10 mg daily.  5. Lasix 40 mg daily.  6. Potassium chloride  20 mEq daily.  7. Aspirin 81 mg daily.  8. Plavix 75 mg daily.  9. Nitroglycerin 0.04 mg as needed.   REVIEW OF SYSTEMS:  GENERAL:  Positive for weakness.  HEENT:  Unremarkable.  GI:  No abdominal pain, nausea, or vomiting.  Positive  for black stools.  CARDIAC:  No chest pain, palpitations.  LUNGS:  No  wheezing or shortness of breath.  MUSCULOSKELETAL:  No arthralgias or  myalgias.  GENITOURINARY:  Positive for urinary frequency.  No  hematuria.   PHYSICAL EXAMINATION:  VITAL SIGNS:  Pulse 72, blood pressure 124/55.  GENERAL:  He is 77 years old.  Well developed, well nourished, well  hydrated.  In no acute distress.  Pleasant, cooperative.  HEENT:  Head is atraumatic, normocephalic.  No scleral icterus.  Oral  mucosa is moist.  NECK:  Soft, suppler, nontender, nondistended.  HEART:  Regular rate and rhythm.  No gallops or murmurs.  LUNGS:  Decreased breath sounds.  No inspiratory wheezing.  No rales or  rhonchi.  ABDOMEN:  Obese, soft, nontender, nondistended.  Positive bowel sounds.  EXTREMITIES:  He has trace to +1 edema.   No ecchymosis or cyanosis.   LABORATORIES:  Performed by the cardiologist at his office showed sodium  130, potassium 5, chloride 97, CO2 26, glucose 698, BUN 55, creatinine  2.  White count 7.9, hemoglobin 8.1, hematocrit 24, platelet count 172.   ASSESSMENT AND PLAN:  1. Possible gastrointestinal bleed.  Per cardiology, patient having      positive Hemoccult stools in his office.  Will get a GI consult.      Will discontinue the patient's Coumadin.  Will continue his Plavix      and aspirin per cardiology recommendations at this time.  Will get      a hemoglobin and hematocrit every 6 hours until he is evaluated by      gastroenterology.  The patient will be placed on a diabetic diet,      then changed to n.p.o. except for medications after midnight.  2. Hyperglycemia.  The patient does have a history of diabetes.  The      patient's wife states the patient's blood sugars have been elevated      over the past few months.  Will start the patient on Glucommander      per protocol at this time, and once his Glucommander is      discontinued the patient will be placed on a resistance sliding      scale.  Will add A1c to his a.m. labs.  3. Hyponatremia.  Will cautiously hydrate the patient and recheck his      sodium level in the a.m.  4. Anemia, probably secondary to a possible gastrointestinal bleed at      this time.  Will await gastroenterology recommendations.  5. History of coronary artery disease, and status post stent      placement.  Will get a cardiology consult regarding any      recommendations they have at this time.  6. History of chronic obstructive pulmonary disease.  The patient will      be on nebulized treatments if needed.  7. Hyperlipidemia.  The patient will be placed on his Zetia.  That is      a home medication.   The patient will also be placed on all his other home medications.  The  patient will be on GI prophylaxis as well as DVT prophylaxis.  Skeet Latch, DO  Electronically Signed     SM/MEDQ  D:  01/17/2008  T:  01/17/2008  Job:  [id number]   cc:   Madelin Rear. Sherwood Gambler, MD  Fax: [phone]

## 2011-02-15 NOTE — Consult Note (Signed)
[name], [name] NO.:  [id number]   MEDICAL RECORD NO.:  [id number]          PATIENT TYPE:  INP   LOCATION:  2006                         FACILITY:  [facility]   PHYSICIAN:  Antonietta Breach, M.D.  DATE OF BIRTH:  August 15, 1928   DATE OF CONSULTATION:  02/11/2009  DATE OF DISCHARGE:                                 CONSULTATION   [name] continues with waxing and waning disorientation and  confusion.  This morning while being fed eggs he picks his right arm up  with his hand in the position as if holding a transceiver.  He states  that he thought he was on the radio.  He is able to cooperate with the  tech feeding him.  He is not agitated or combative.   REVIEW OF SYSTEMS:  NEUROLOGIC:  He is not having any stiffness or other  extrapyramidal side effects with the Zyprexa which is at 5 mg bedtime.   LABORATORY DATA:  Sodium 138, BUN 32, creatinine 1.56, glucose 139, WBC  is 9.4.   EXAMINATION:  VITAL SIGNS:  Temperature 96.9, pulse 76, respiratory rate  18, blood pressure 126/60, O2 saturation on 2 liters 99%.   MENTAL STATUS EXAM:  [name] does have some clouding of  consciousness.  His attention span is decreased.  He slowly turns his  head to recognize when the undersigned is calling his name.  Concentration is decreased.  Affect is flat at the time of the exam.  His mood is mildly depressed.  He is able to chuckle when a joke is  made.  He does answer orientation questions partially correct.  He does  name the year and the month correctly.  He does not know the day of the  month.  He does not know where he is.  He is oriented to person.  His  memory is grossly impaired.  Fund of knowledge and intelligence are  below that of his estimated premorbid baseline.  His speech is  impoverished.  Thought process:  Partial confabulation.  Thought  content:  As above regarding the radio transceiver hallucination.  Insight is poor, judgment is impaired.   ASSESSMENT:  Dementia, not otherwise specified.  [name] has had  consistent difficulties with memory as well as judgment.  He does appear  to have some delirium overlay.  However, his decapacitating diagnosis at  this point appears to be dementia, not otherwise specified, in that he  is having sustained impaired judgment.   He is having a good response to Zyprexa.  However, he will need to  continue on his current Zyprexa dosage for resolution of the remaining  delirium symptoms.   Would continue to monitor for any stiffness or other extrapyramidal side  effects with the Zyprexa.   He is psychiatrically cleared for discharge to a placement facility in  that he is cooperating well with his bedside care.  He is not  demonstrating agitation or combativeness.   Would ask the social worker to set him up with psychiatric medication  management during the first week of discharge.  Would perform abnormal involuntary movement scales and hemoglobin A1c  checks while on the Zyprexa.   Once he is settled in his discharge environment, the Zyprexa can be  tried off.   The undersigned will order other memory dysfunction etiology tests  including RPR, TSH, folic acid, B12 and ammonia.   If these tests are negative and he continues with memory dysfunction,  would consider Aricept and/or Namenda.      Antonietta Breach, M.D.  Electronically Signed     JW/MEDQ  D:  02/11/2009  T:  02/11/2009  Job:  [id number]

## 2011-02-15 NOTE — Discharge Summary (Signed)
[name], [name]                 ACCOUNT NO.:  [id number]   MEDICAL RECORD NO.:  [id number]          PATIENT TYPE:  INP   LOCATION:  A301                          FACILITY:  [facility]   PHYSICIAN:  Skeet Latch, DO    DATE OF BIRTH:  1928-03-12   DATE OF ADMISSION:  02/15/2008  DATE OF DISCHARGE:  05/18/2009LH                               DISCHARGE SUMMARY   DISCHARGE DIAGNOSES:  1. Acute exacerbation of congestive heart failure.  2. Acute renal insufficiency.  3. Type 2 diabetes.  4. Status post stent placement in March 2009.  5. History of hypertension.  6. History of hyperlipidemia.  7. History of obesity.  8. History of pulmonary edema.  9. History of peptic ulcer disease.  10.History of chronic obstructive pulmonary disease.  11.History of peripheral vascular disease.   BRIEF HOSPITAL COURSE:  This is a 77 year old Caucasian male very well  known to our service who presented with 2-week history of frequent  shortness of breath.  According to his wife, the patient is having  increasing dyspnea and lower extremity edema for the last 2 weeks.  The  patient is status post stent placement approximately 2 months prior done  at [facility] secondary  acute/pulmonary edema and a non-ST elevated  myocardial infarction.  The patient presented with shortness of breath  and states that he was placed on Zaroxolyn and did not take his Lasix  over the last few days.  He then began to become more severe short of  breath on exertion and having increasing lower extremity edema.  The  patient was seen.  He was admitted to [facility] for COPD.  Cardiology was consulted.  The patient was placed on IV Lasix and was  switched to oral.  His Zaroxolyn and Actos were discontinued during this  hospital stay.  The patient's breathing has improved.  He was on nasal  cannula oxygen during his hospital stay, and his BNP also has improved.  Initial chest x-ray showed CHF with mild edema, small  left pleural  effusion.  Subsequent x-ray done on Feb 17, 2008, showed improved  interstitial edema with persistent left effusion.  The patient's  breathing status has improved on oxygen.  The patient sats in the 90s  when he is at rest, but drops in the 80s when he is ambulatory.  The  patient will be send home with home health and they would adjust to see  if the patient needs home O2.  Cardiology has seen the patient and has  adjusted his Lasix dose and scheduled the patient to return for blood  work, I believe at their office, on Feb 20, 2008, and scheduled him to  see his cardiologist, Dr. Antoine Poche, on Feb 21, 2008.   MEDICATIONS ON DISCHARGE:  1. Zetia 10 mg daily.  2. Klor-Con 20 mEq daily.  3. Aspirin 81 mg daily.  4. Imdur 30 mg twice a day.  5. Micronase 5 mg two tablets in a.m. and 5 mg one tablet at dinner.  6. Restoril 30 mg every other night.  7. Mirtazapine 15  mg at bedtime.  8. Ferrous sulfate 325 mg daily.  9. Coreg 3.125 mg twice a day.  10.Glyset 50 mg three times a day.  11.Centrum 1 tablet daily.  12.Actos 30 mg daily.  13.Plavix 75 mg daily.  14.Omeprazole 20 mg daily.  15.Micronase 5 mg twice a day.  16.Lasix 60 mg daily.  17.Prednisone taper dose.   VITALS ON DISCHARGE:  Temperature is 97.2, pulse 88, respirations 18,  and blood pressure 150/58.  He sats 85% on room air while ambulating.   LABORATORY ON DISCHARGE:  Last troponin was 0.04, total creatine kinase  36, and CK-MB is 2.3.  Blood cultures were negative.  BNP was 228.  Sodium 134, potassium 4.4, chloride 92, CO2 of 32, glucose 98, BUN 71,  and creatinine 1.98.  White count was 6700, hemoglobin 10.5, hematocrit  29.8, and platelets 201.   CONDITION ON DISCHARGE:  Stable.   DISPOSITION:  The patient will be discharged to home.   DISCHARGE INSTRUCTIONS:  The patient to maintain a low-salt and heart-  healthy diet.  He is to increase his activity slowly.  The patient is to  have home health  regarding his needs as well as his oxygen needs.  The  patient is to follow up with Dr. Sherwood Gambler within the next 2 days.  The  patient is to return for lab work with our Cardiology on Feb 20, 2008,  and the patient to return for followup with Dr. Antoine Poche on Feb 21, 2008.  The patient is to return to emergency room if he has any severe  shortness of breath or chest pain and/or dial 911.      Skeet Latch, DO  Electronically Signed     SM/MEDQ  D:  02/18/2008  T:  02/19/2008  Job:  [id number]   cc:   Madelin Rear. Sherwood Gambler, MD  Fax: [phone]   Rollene Rotunda, MD, [facility]  [address]  [address]  [place]  [place] [address]

## 2011-02-15 NOTE — Discharge Summary (Signed)
[name], [name] NO.:  [id number]   MEDICAL RECORD NO.:  [id number]          PATIENT TYPE:  INP   LOCATION:  2925                         FACILITY:  [facility]   PHYSICIAN:  Rollene Rotunda, MD, FACCDATE OF BIRTH:  [birth year]   DATE OF ADMISSION:  03/09/2009  DATE OF DISCHARGE:  03/13/2009                               DISCHARGE SUMMARY   PRIMARY CARDIOLOGY:  Rollene Rotunda, MD, FACC   ELECTROPHYSIOLOGY:  Duke Salvia, MD, [facility]   PRIMARY CARE PHYSICIAN:  Madelin Rear. Sherwood Gambler, MD.  The patient is also  being followed by [facility].   DISCHARGING DIAGNOSES:  1. Acute on chronic respiratory failure secondary to chronic lung      disease in the setting of ongoing tobacco abuse, chronic      obstructive pulmonary disease, chronic anemia, congestive heart      failure secondary to ischemic cardiomyopathy with ejection fraction      of 40%, large left pleural effusion status post chest tube      placement for drainage in May 2010.  2. Intermittent high-grade heart block with pauses of greater than 8-      second duration with of the loss of consciousness, status post      placement of a permanent pacemaker.  The patient had a single      chamber St. Jude device placed on March 11, 2009 by Dr. Sherryl Manges.  3. Chronic atrial fibrillation, not a Coumadin candidate secondary to      history of gastrointestinal bleeding.  4. Acute mental status changes this admission in the setting of acute      respiratory distress and high-grade heart block.  5. Chronic renal insufficiency, creatinine stable at 1.5 at time of      discharge.  6. Diabetes.  7. Early dementia.  8. Coronary artery disease, status post two drug-eluting stents to the      right coronary artery and circumflex artery.  9. History of degenerative joint disease.  10.Hypertension.  11.Hyperlipidemia.  12.Peptic ulcer disease.  13.Peripheral vascular disease.   ALLERGIES:  SULFA,  STATINS, and COUMADIN.   PROCEDURES THIS ADMISSION:  1. Placement of a single chamber pacemaker on March 11, 2009.  2. CT of the head without contrast on March 09, 2009 showing right      sphenoid sinusitis, atherosclerosis, no acute findings in cranially      noted, white matter hypodensities also noted compatible with      chronic ischemic microvascular white matter disease.   PERTINENT LABS AT TIME OF DISCHARGE:  Urine culture negative.  BNP 304.  BMET; potassium 3.7, BUN 23, creatinine 1.5.  Sodium 142.  WBCs 9.4,  hemoglobin 10, hematocrit 30.7, platelet count 257,000.   HOSPITAL COURSE:  [name] is a 77 year old Caucasian gentleman with  multiple comorbidities who presents with acute respiratory distress with  altered mental status, status post recent hospitalization for the same  with significant pleural effusion requiring chest tube placement back in  May.  Since being discharged home, family noted altered mental status  episodes with  worsened CHF, and an unresponsive episode lasts  approximately 2-3 minutes, which time, the patient gently fell off the  toilet, apparently home health nurse was there at that time witnesses  episode.  Unfortunately, the patient has returned to smoking  approximately half a pack a day since being discharged home in May.  Chest x-ray showed congestive heart failure with edema and effusions.  EKG showed atrial fibrillation at a rate of 64 with left axis deviation.  Initial lab work, hematocrit 25.9.  Cardiac point of cares were  negative.  Initially, unclear as to what type of unresponsive episode  the patient had.  He was admitted for observation.  Family also noted  the patient had had a productive cough.  He was treated with Avelox for  5 days.  The patient admitted to step-down and noted to have significant  pauses on monitor.  As long as a second pause noted on the evening of  day of admission, carvedilol was discontinued.  EP was asked to   evaluate.  Syncopal episode felt to be secondary to profound bradycardia  and pauses.  Dr. Graciela Husbands felt the patient would benefit from therapeutic  pacemaker implantation.  The patient underwent St. Jude single-chamber  device implant on March 11, 2009.  Post pacemaker film showed worsening  interstitial edema with persistent bilateral pleural effusions.  No  pneumothorax seen.  The patient's diuretic dose increased and Zaroxolyn  added.  Chest x-ray repeated the following day.  Edema still noted with  effusions.  Because of the patient's comorbidities and worsening of  respiratory status, it was felt he would benefit from hospice and  palliative care assistance at home.  Family aware and agreeable to  proceed with hospice and palliative care consult.  The patient lives out  of [place], [facility] and Palliative Care has  been asked to assist with the patient's care once discharged.  The  patient responded well to aggressive diuresis.  Creatinine remained  stable at time of discharge 1.5.  Potassium has been supplemented and  potassium at time of discharge is 3.7.  The patient will, however, need  to be discharged home on some supplemental potassium with increased  Lasix dose.  Greater than 30 minutes has been spent educating family and  arranging home hospice and palliative care assistance with case manager  and home O2, which the patient definitely qualifies for hospice state.  Family has been given the post pacemaker supplemental discharge  instructions.  The patient may not drive.   Medications at time of discharge have been reviewed with family and are  as follows:  1. Lasix has been increased to 80 mg b.i.d.  2. Xopenex nebs treatment 0.63 mg every 4 hours as needed or a Xopenex      inhaler.  3. O2 2 liters nasal cannula as needed.  4. Ativan I have given them 1 mg tablets, they may use force to whole      tablet as needed every 6 hours for anxiety.  5. Mucinex  1200 mg p.o. b.i.d.  6. Potassium 20 mEq b.i.d.  7. Continue the Uroxatral 10 mg daily.  8. Hydralazine 12.5 mg t.i.d.  9. Iron supplement 150 mg daily.  10.Zetia 10 mg daily.  11.Isosorbide 30 mg daily.  12.Spiriva inhaler daily.  13.Aspirin 81 mg daily.  14.Micronase 5 mg t.i.d.  15.Glyset 50 mg t.i.d.  16.The patient is no longer taking the Zyprexa per family members.  We  have stopped his carvedilol and family has stopped Elavil as it is      not effective.   I have arranged for the patient to be seen in the [facility] on [address]  [address] on March 26, 2009 at 3 p.m. and follow up with Dr. Antoine Poche on  April 20, 2009 at 2 p.m.  Spoken with [facility],  they will notify our office for any questions or concerns or updated  orders as needed regarding the patient's care.   DURATION OF DISCHARGE ENCOUNTERED:  Well over 45 minutes.      Dorian Pod, ACNP      Rollene Rotunda, MD, [facility]  Electronically Signed    MB/MEDQ  D:  03/13/2009  T:  03/14/2009  Job:  [id number]   cc:   Madelin Rear. Sherwood Gambler, MD

## 2011-02-15 NOTE — Assessment & Plan Note (Signed)
[facility]                            CARDIOLOGY OFFICE NOTE   BERTRAND, VOWELS                        MRN:          [id number]  DATE:10/22/2008                            DOB:          May 31, 1928    PRIMARY CARE PHYSICIAN:  Madelin Rear. Fusco, MD.   REASON FOR PRESENTATION:  Evaluate the patient with coronary disease and  mixed diastolic-systolic heart failure.   HISTORY OF PRESENT ILLNESS:  The patient is 77 year old.  Since I last  saw him, he has been doing relatively well.  His weight was been dosing  his Lasix as needed.  He has been keeping his feet elevated.  He has  been avoiding salt and watching fluid.  Unfortunately, he is still  smoking cigarettes.  He is not having any acute exacerbations or  shortness of breath and he is not having any PND or orthopnea.   PAST MEDICAL HISTORY:  Coronary artery disease (Feb 21, 2008, note for  details), mildly reduced ejection fraction (40%), mild chronic renal  insufficiency with acute exacerbations, type 2 diabetes mellitus,  hypertension, dyslipidemia, obesity, peptic ulcer disease with GI  bleeding in January 17, 2008, COPD, ongoing tobacco abuse, peripheral  vascular disease, allergic rhinitis, cholecystectomy, and atrial  fibrillation.   ALLERGIES:  Intolerance to SULFA and STATINS.   MEDICATIONS:  1. Elavil 25 mg daily.  2. Omeprazole 20 mg daily.  3. Lasix 60 mg daily.  4. Actos 30 mg daily.  5. Glyset 50 mg t.i.d.  6. Micronase 5 mg b.i.d.  7. Coreg 3.125 mg b.i.d.  8. Plavix 75 mg daily.  9. Aspirin 81 mg daily.  10.Spiriva.  11.Isosorbide 30 mg b.i.d.  12.Zetia 10 mg daily.  13.Klor-Con 20 mEq daily.   REVIEW OF SYSTEMS:  As stated in the HPI, and otherwise negative for  other systems.   PHYSICAL EXAMINATION:  GENERAL:  The patient is in no distress.  VITAL SIGNS:  Blood pressure 130/60, heart rate 84 and regular, weight  218 pounds, and body mass index 32.  NECK:  No  jugular venous distention at 45 degrees, carotid upstroke  brisk and symmetric, no bruits, no thyromegaly.  LYMPHATICS:  No adenopathy.  LUNGS:  Decreased breath sounds with wheezing with no crackles, no  dullness to percussion.  HEART:  S1 and S2 within normal limits, no S3, distant heart sounds, no  obvious murmur.  ABDOMEN:  Obese, positive bowel sounds, normal in frequency and pitch.  No bruits, rebound, or guarding.  No midline pulsatile mass, no  organomegaly.  SKIN:  No rashes.  EXTREMITIES:  Pulses 2+, mild bilateral lower extremity edema.  NEURO:  Grossly intact.   EKG:  Atrial fibrillation, rate 84,  left axis deviation, no acute ST-T  wave changes.   ASSESSMENT AND PLAN:  1. Systolic-diastolic heart failure.  The patient seems to be at his      baseline.  We will get a BMET today to make sure that we were not      drawing at his kidneys to much.  He will continue on the  regimen as      listed.  2. Coronary artery disease.  No active ischemia is obvious and he will      continue with a risk reduction.  3. Tobacco.  I counseled him about the need to stop smoking, but he      cannot.  4. Atrial fibrillation.  The patient is in atrial fibrillation.  He      has had reasonable rate control.  He is not a Coumadin candidate.      At this point, he will continue with rate control alone.  5. Followup.  I will see him again in about 3 months or sooner if      needed.     Rollene Rotunda, MD, [facility]  Electronically Signed    JH/MedQ  DD: 10/22/2008  DT: 10/22/2008  Job #: [id number]   cc:   Madelin Rear. Sherwood Gambler, MD

## 2011-02-15 NOTE — Assessment & Plan Note (Signed)
[facility]                            CARDIOLOGY OFFICE NOTE   AUBURN, HERT                        MRN:          [id number]  DATE:06/16/2008                            DOB:          Mar 10, 1928    PRIMARY CARE PHYSICIAN:  Madelin Rear. Fusco, MD.   REASON FOR PRESENTATION:  Evaluate the patient with coronary artery  disease and heart failure.   HISTORY OF PRESENT ILLNESS:  The patient is a pleasant 77 year old  gentleman with a history of coronary artery disease as described.  He  has been doing well since I last saw him.  Unfortunately, he has picked  up cigarettes smoking 6-7 a day again.  He is not having any new  shortness of breath.  He is not having any PND or orthopnea.  He has not  been having any chest discomfort, neck or arm discomfort.  He denies any  palpitations.  He has had no presyncope or syncope.   PAST MEDICAL HISTORY:  Coronary artery disease (see the Feb 21, 2008,  note for details), mildly reduced ejection fraction (40%), mild chronic  renal insufficiency with acute exacerbations, type 2 diabetes mellitus,  hypertension, dyslipidemia, obesity, peptic ulcer disease with recent GI  bleed on January 17, 2008, chronic obstructive pulmonary disease,  peripheral vascular disease, allergic rhinitis, cholecystectomy, and  ongoing tobacco use.   ALLERGIES INTOLERANCE:  SULFA and STATINS.   MEDICATIONS:  1. Actos 30 mg daily.  2. Lasix 60 mg daily.  3. Micronase 5 mg b.i.d.  4. Coreg 3.125 mg b.i.d.  5. Plavix 75 mg daily.  6. Aspirin 81 mg daily.  7. Spiriva.  8. Isosorbide 30 mg b.i.d.  9. Klor-Con 20 mEq daily.  10.Zetia 10 mg daily.   REVIEW OF SYSTEMS:  As stated in the HPI and otherwise negative for  other systems.   PHYSICAL EXAMINATION:  GENERAL:  The patient is in no distress.  VITAL SIGNS:  Blood pressure 124/68, heart rate 71 and regular, weight  216 pounds, and body mass index 32.  HEENT:  Otherwise  unremarkable; pupils equal, round and reactive to  light; fundi nonvisualized; and oral mucosa unremarkable.  NECK:  No jugular venous distension at 45 degrees, carotid upstroke  brisk and symmetrical; no bruits; and no thyromegaly.  LYMPHATICS:  No adenopathy.  LUNGS:  Decreased breath sounds with few expiratory wheezes, no  crackles.  BACK:  No costovertebral angle tenderness.  CHEST:  Unremarkable.  HEART:  PMI not displaced or sustained; S1 and S2 within normal limits;  and no S3, no murmurs.  ABDOMEN:  Obese, positive bowel sounds, normal in frequency and pitch;  no bruits, no rebound, no guarding; no midline pulsatile mass; or no  organomegaly.  EXTREMITIES:  2+ pulses,  no edema.  NEURO:  Grossly intact.   EKG, atrial fibrillation, intraventricular conduction delay, left axis  deviation, left anterior fascicular block, and no acute ST-T wave  changes.   ASSESSMENT AND PLAN:  1. Coronary artery disease.  The patient is having no ongoing      symptoms.  No  further cardiovascular testing is suggested.  He will      continue with attempts with risk reduction.  2. Atrial fibrillation.  The patient had a recent gastrointestinal      bleed.  He is on Plavix and aspirin.  He will not tolerate      Coumadin.  I placed 24-hour Holter monitor to make sure he has      reasonable rate control.  3. Cardiomyopathy.  He will not tolerate angiotensin-converting enzyme      inhibitors because of his renal insufficiency with acute      exacerbation in the past.  I am going to titrate his carvedilol to      6.25 mg b.i.d.  4. Tobacco.  We continued to talk about the need to stop smoking but I      do not think he ever will.  5. Followup.  We will see him back in 6 months.  Followup with the      results of the Holter in that time.  He should follow up with Dr.      Sherwood Gambler routinely.     Rollene Rotunda, MD, [facility]  Electronically Signed    JH/MedQ  DD: 06/16/2008  DT: 06/17/2008  Job #:  [id number]   cc:   Madelin Rear. Sherwood Gambler, MD

## 2011-02-15 NOTE — H&P (Signed)
[name], [name]                 ACCOUNT NO.:  [id number]   MEDICAL RECORD NO.:  [id number]          PATIENT TYPE:  INP   LOCATION:  A301                          FACILITY:  [facility]   PHYSICIAN:  Skeet Latch, DO    DATE OF BIRTH:  July 08, 1928   DATE OF ADMISSION:  02/15/2008  DATE OF DISCHARGE:  LH                              HISTORY & PHYSICAL   CHIEF COMPLAINT:  Shortness of breath.   HISTORY OF PRESENT ILLNESS:  This is a 77 year old Caucasian male who is  well-known to our service, who presents with a 2-week history of  increasing shortness of breath.  According to the patient and wife, the  patient has been having increasing dyspnea and lower extremity edema  over the past 2 weeks.  The patient does have a history of fairly recent  non-Q-wave myocardial infarction with stent placement.  He was  discharged from [facility] in March 2009 after acute flash pulmonary  edema and had a non-ST elevated myocardial infarction.  At the time the  patient underwent stent placement at [facility].  The patient's  recent admission and discharge from [facility] was back in April 2009,  with melena and was found to have a duodenal ulcer.  The patient was  discharged with appropriate medications and follow-up at that time.  This admission the patient states that he has been having problems  breathing for the last few weeks.  He called his primary care physician  and his Lasix was increased and the patient was placed on Zaroxolyn.  The patient otherwise states that this has not helped and this morning  the patient had severe shortness of breath and decided to come to the  emergency room to be evaluated.  On my exam the patient is awake and  alert.  He is stable.  He states that his breathing has improved but he  is on nasal cannula oxygen at this time.   PAST MEDICAL HISTORY:  1. Positive for type 2 diabetes.  2. Status post stent placement in March 2009.  3. History of  hypertension.  4. History of hyperlipidemia.  5. History of obesity.  6. History of pulmonary edema.  7. History of peptic ulcer disease.  8. History of pancreatitis.  9. Cholelithiasis.  10.COPD.  11.Degenerative joint disease.  12.PVD.   SURGICAL HISTORY:  1. Cholecystectomy  2. The patient did have EGD.  3. He is status post stent placement in March 2009.   SOCIAL HISTORY:  He lives with his wife.  He is retired from [employer].  He was a 50pack-year history smoker.  Quit in September 2008.   DRUG ALLERGIES:  SULFA and STATINS.   CURRENT HOME MEDICATIONS:  1. Restoril 30 mg every other night.  2. Mirtazapine 15 mg at bedtime.  3. Ferrous sulfate 325 mg daily.  4. Metazolone 5 mg daily.  5. Coreg 3.125 mg twice a day.  6. Glyset 50 mg three times a day.  7. Centrum one tablet daily.  8. Actos 45 mg daily.  9. Plavix 75 mg  daily.  10.Omeprazole 20 mg daily.  11.Micronase 5 mg two tablets in the morning and Micronase 5 mg one      tablet at dinner.  12.Lasix 40 mg three times a day.  13.Zetia 10 mg daily.  14.Klor-Con 20 mEq daily.  15.Aspirin 81 mg daily.  16.Imdur 30 mg twice a day.   REVIEW OF SYSTEMS:  GENERAL:  Positive for some weakness.  No fever or  chills.  HEENT:  Unremarkable.  GASTROINTESTINAL:  No abdominal pain,  nausea, vomiting, diarrhea.  CARDIAC:  No chest pain or palpitations.  RESPIRATORY:  Positive for shortness of breath and wheeze.  MUSCULOSKELETAL:  No arthralgias or myalgias.  GENITOURINARY:  No  urgency or frequency.  No hematuria.   PHYSICAL EXAM:  VITAL SIGNS:  Temperature is 97.5, pulse 97,  respirations 24, blood pressure 130/89.  He is saturating 90% on 4 L.  GENERAL:  He is 77 years old, well-developed, well-nourished, well-  hydrated.  The patient had exertional dyspnea.  He is pleasant and  cooperative, awake and alert.  HEENT:  Head is atraumatic, normocephalic.  No scleral icterus.  Oral  mucosa is moist.  NECK:  Soft,  supple, nontender, nondistended.  HEART:  Regular rate and rhythm.  No murmurs, rubs or gallops.  LUNGS:  Decreased breath sounds.  End-expiratory wheezing noted.  Slight  rales.  No rhonchi.  ABDOMEN:  Obese, soft, nontender, nondistended.  Positive bowel sounds.  No rigidity or guarding.  NEUROLOGIC:  Cranial nerves II-XII are grossly intact.  He is alert and  oriented x3.  EXTREMITIES:  He has +2 edema, bilateral lower extremities.   LABS:  Last troponin was less than 0.05, CK-MB is less than 1.  BNP is  408.  Sodium 135, potassium 4.7, chloride 103, CO2 26, glucose 303, BUN  32, creatinine 1.48.  White count is 10,400, hemoglobin 10.4, hematocrit  30.1, platelet count is 190.  ABG on nonrebreather showed a pH of 7.333,  pCO2 is 44.3, pO2 is 189, bicarb 22.9.   ASSESSMENT:  1. Acute exacerbation of congestive heart failure.  2. History of type 2 diabetes.  3. History of peptic ulcer disease.  4. History of anemia.  5. History of chronic obstructive pulmonary disease.  6. History of hyperlipidemia.   PLAN:  1. The patient will be admitted to a telemetry unit.  2. For his CHF we will consult cardiology at this time.  We will stop      his oral Lasix and switch to IV Lasix at this time.  The patient      was on Zaroxolyn secondary to his SULFA allergy.  We will hold his      Zaroxolyn at this time and await recommendations per cardiology.      We will get accurate I&O's.  The patient will be on breathing      treatments around the clock and p.r.n. to try to keep his O2      saturations above greater than 90%.  We will check his BNP in the      morning.  The patient probably needs a repeat chest x-ray in the      next 24-48 hours also.  3. For his diabetes the patient will be placed on his home medications      except for his Actos.  This could exacerbate lower extremity edema.      The patient will be placed on his Micronase and Glyset.  As stated,  the patient will be on a  sliding scale and blood sugars will be      checked a.c. and nightly.  4. For his anemia, this seems to be chronic in nature.  We will      monitor his hemoglobin and hematocrit on a daily basis.  5. For his hypertension and hyperlipidemia, the patient will be placed      on his home medications.  6. Lastly, the patient will be placed on DVT as well as GI      prophylaxis.      Skeet Latch, DO  Electronically Signed     SM/MEDQ  D:  02/15/2008  T:  02/15/2008  Job:  [id number]   cc:   Corrie Mckusick, M.D.  Fax: [phone]

## 2011-02-15 NOTE — Assessment & Plan Note (Signed)
[facility]                            CARDIOLOGY OFFICE NOTE   [name], [name]                        MRN:          [id number]  DATE:02/21/2008                            DOB:          [birth year]    PRIMARY:  [name]. Sherwood Gambler, MD.   REASON FOR PRESENTATION:  Evaluate patient with recent hospitalization  for heart failure.   HISTORY OF PRESENT ILLNESS:  The patient is a 77 year old gentleman with  a history of coronary disease and heart failure. His ejection fraction  has been greater than 40% on his most recent echo.  He was just  hospitalized from May 15 to May 18 at [facility].  He was  managed for volume overload with diuretics.  He had improvement in  interstitial edema and left effusion.  His oxygen status improved.  He  did have apparently some renal insufficiency, and was actually sent home  on a lower dose of diuretic.  Since going home, he has done relatively  well.  Had no acute exacerbations.  After talking with the patient and  his family, it is clear that he would let his symptoms get worse, over  several days, before finally having to present to the emergency room.  His weight would go up.  Now, he is weighing himself daily; and we have  talked, again, about p.r.n. diuretics for weight gain.  He is very much  watching his salt which he was not doing as much before.  He is watching  his fluid intake.  He is not smoking cigarettes at all.  He has had no  new chest discomfort, neck, or arm discomfort.  He has had no new  palpitations, presyncope, or syncope.  He has had some mild lower  extremity swelling.  His family reports that he has had increased  irritability.   PAST MEDICAL HISTORY:  1. Coronary artery disease (last catheterization was following non-Q-      wave myocardial infarction early April.  This demonstrated an LAD      with 30%-40% stenosis, first diagonal was moderate-sized with      diffuse nonobstructive  disease, circumflex had 30%-40% stenosis,      there was a large branching ramus intermediate with 95% stenosis      before bifurcating lesion.  The right coronary artery was dominant.      There was a long proximal 90% stenosis.  There was mid-95%      stenosis.  There was 50% stenosis before the PDA.  The patient      underwent stenting with a Promise drug-eluting stent to the      proximal right coronary artery.  The circumflex was treated with a      drug-eluting stent as well.)  2. A mildly reduced ejection fraction (40%).  3. Mild chronic renal insufficiency with acute exacerbations.  4. Type 2 diabetes mellitus.  5. Hypertension.  6. Hyperlipidemia.  7. Obesity.  8. Peptic ulcer disease with a recent GI bleed on April 16.  9. Chronic obstructive pulmonary disease.  10.Peripheral vascular disease.  11.Allergic rhinitis.  12.Cholecystectomy.  13.Previous tobacco abuse.   ALLERGIES:  SULFA and STATIN.   MEDICATIONS:  1. Prednisone taper.  2. Actos 30 mg daily.  3. Prilosec 20 mg daily.  4. Centrum Silver.  5. Glyset 50 mg t.i.d.  6. Iron.  7. Lasix 60 mg daily.  8. Micronase 5 mg b.i.d.  9. Coreg 3.125 mg b.i.d.  10.Plavix 75 mg daily.  11.Aspirin 81 mg daily.  12.Spiriva.  13.Zetia 10 mg daily.  14.Klor-Con 20 mEq daily.   REVIEW OF SYSTEMS:  As stated in the HPI, otherwise negative for other  systems.   PHYSICAL EXAMINATION:  The patient seated and examined in the chair.  Blood pressure 121/70, heart 70 and regular, weight 204 pounds.  NECK:  No jugular distention at 90 degrees.  Carotid upstroke brisk and  symmetric, no bruits, no thyromegaly.  LUNGS:  Clear to auscultation without wheezing or crackles.  HEART:  PMI not displaced or sustained, S1-S2 within normal limits, no  S3, no murmurs.  ABDOMEN:  Obese, positive bowel sounds.  Normal frequency, pitch, no  bruits, no rebound, no guarding in the midline, pulsatile mass, no  organomegaly.  EXTREMITIES:  2+  pulses, no edema.  NEURO:  Grossly intact.   ASSESSMENT/PLAN:  1. Heart failure.  The patient has acute-on-chronic predominantly      diastolic heart failure.  We had a long discussion, today, about      salt and fluid restriction.  I think he is very much on board with      this.  We also had a discussion about p.r.n. diuretics.  We      discussed daily weights; and, I think, that he will comply with      this, and has a good understanding of the need for p.r.n.      diuretics.  If he needs diuretics more frequently or has volume      issues or dyspnea, he is going to reacted more quickly.  He will      call us, and have a lower threshold of seeking care rather than      letting this go on.  2. Chronic obstructive pulmonary disease with exacerbation.  I am      delighted that he is still not smoking.  3. Coronary artery disease.  He will continue with risk reduction.  He      will continue with the medications as listed.  4. Renal insufficiency.  Will follow this closely.  He is not on ACE      inhibitors because of this.   FOLLOWUP:  I would like to see him back in about 3 months or sooner if  needed.     Rollene Rotunda, MD, [facility]  Electronically Signed    JH/MedQ  DD: 02/21/2008  DT: 02/21/2008  Job #: [id number]   cc:   [name]. Sherwood Gambler, MD

## 2011-02-15 NOTE — Assessment & Plan Note (Signed)
[facility]                            CARDIOLOGY OFFICE NOTE   [name], [name]                        MRN:          [id number]  DATE:01/21/2009                            DOB:          04/12/1928    PRIMARY CARE PHYSICIAN:  Madelin Rear. Sherwood Gambler, MD   REASON FOR PRESENTATION:  Evaluate the patient with dyspnea.   HISTORY OF PRESENT ILLNESS:  The patient returns for followup of the  above.  Since I last saw him, he has had continued dyspnea.  Unfortunately, he continues to smoke cigarettes.  He has been treated  recently with antibiotics and theophylline by Dr. Sherwood Gambler.  He has not  really had any improvement in his dyspnea.  He is short of breath doing  minimal exertion in his house.  He does not get out very much at all.  He is not describing any PND or orthopnea.  He has had increased lower  extremity swelling.  Periodically based on his weight, his wife will  give him an extra 20 mg of Lasix.  He had chronic renal insufficiency,  although this has been stable.  He is watching his salt.  As above, he  is still smoking cigarettes.  He is not having any fevers or chills.  He  has had no new cough.  He has had no chest pressure, neck or arm  discomfort.   PAST MEDICAL HISTORY:  1. Coronary artery disease (see the Feb 21, 2008, note for details).  2. Mildly reduced ejection fraction (40%).  3. Mild chronic renal insufficiency.  4. Type 2 diabetes mellitus.  5. Hypertension.  6. Dyslipidemia.  7. Obesity.  8. Peptic ulcer disease with recent GI bleed January 17, 2008.  9. Chronic obstructive pulmonary disease.  10.Peripheral vascular disease.  11.Allergic rhinitis.  12.Cholecystectomy.  13.Ongoing tobacco use.  14.Atrial fibrillation.   ALLERGIES/INTOLERANCES:  SULFA and STATINS.   MEDICATIONS:  1. Amitriptyline.  2. Levaquin.  3. Theo-24.  4. Coreg 6.25 mg b.i.d.  5. Lasix 20 mg daily.  6. Omeprazole 20 mg daily.  7. Actos 30 mg  daily.   REVIEW OF SYSTEMS:  As stated in the HPI and otherwise negative for all  other systems.   PHYSICAL EXAMINATION:  GENERAL:  The patient is in no distress.  VITAL SIGNS:  Blood pressure 106/64 and heart rate 76 and regular.  HEENT:  Eyelids unremarkable; pupils are equal, round, and reactive to  light; fundi not visualized; oral mucosa unremarkable.  NECK:  No jugular venous distention at 90 degrees, carotid upstroke  brisk and symmetric, no bruits, no thyromegaly.  LYMPHATICS:  No adenopathy.  LUNGS:  Decreased breath sounds without wheezing or crackles.  BACK:  No costovertebral angle tenderness.  CHEST:  Unremarkable.  HEART:  PMI not displaced or sustained; S1 and S2 within normal limits,  no S3, distant heart sounds, no obvious murmurs.  ABDOMEN:  Obese; positive bowel sounds, normal in frequency and pitch;  no bruits, no rebound, no guarding, no midline pulsatile mass.  SKIN:  No rashes, no nodules.  EXTREMITIES:  Upper  pulses 2+, moderate bilateral lower extremity edema,  I did not appreciate dorsalis pedis and posterior tibialis today.  NEURO:  Grossly intact.   EKG, atrial fibrillation, left axis deviation, intraventricular  conduction delay, poor anterior R-wave progression.   ASSESSMENT AND PLAN:  1. Dyspnea.  The patient has progressive dyspnea.  However, he has a      longstanding and ongoing tobacco abuse.  I do not think it is an      overt failure, though, he probably does have some volume overload.      Certainly, his lower extremities are more edematous than previous.      At this point, the only thing I can do for him from a cardiac      standpoint is give him an extra few days of Lasix.  He will take 40      mg for the next 4 days.  He is going to get a BMET on Monday.  He      is going to get reclining chair this Friday and that will help with      his feet.  He needs to keep his feet above his heart and I have      described this at length to the  family and the patient.  He also      has some compression stockings and we will go ahead and have him      wear these.  2. Coronary disease.  I do not think he is having any ongoing angina.      No further cardiovascular testing is suggested.  3. Atrial fibrillation.  He is not a Coumadin candidate with his GI      bleeding which was very severe in the past.  No further therapy is      warranted.  He has had good rate control.  4. End of life.  We have had this discussion in the past.  He does not      want to be a do not resuscitate.  He does have a living will.      Again, the patient has no desire to quit smoking.      We have had a long discussions about this in the past.  5. Followup.  I will see him back in 6 months or sooner if needed.     Rollene Rotunda, MD, [facility]  Electronically Signed    JH/MedQ  DD: 01/21/2009  DT: 01/22/2009  Job #: [id number]   cc:   Madelin Rear. Sherwood Gambler, MD

## 2011-02-15 NOTE — Assessment & Plan Note (Signed)
[facility]                            CARDIOLOGY OFFICE NOTE   [name], [name]                        MRN:          [id number]  DATE:08/22/2008                            DOB:          January 06, 1928    PRIMARY CARE PHYSICIAN:  Madelin Rear. Fusco, MD   REASON FOR PRESENTATION:  Evaluate the patient with coronary artery  disease and heart failure.   HISTORY OF PRESENT ILLNESS:  The patient is 77 years old.  Starting last  week, he had increasing dyspnea.  He had about a 3- to 4-pound weight  gain over 1 or 2 days.  He called our office and he was instructed to  increase his Lasix to 60 mg twice a day.  Over the last day, he finally  started feeling better with this.  He has had some increasing lower  extremity swelling.  He has not had any change in his diet or his salt.  He has not had any chest pressure, neck or arm discomfort.  He has not  had any palpitations, presyncope, or syncope.   PAST MEDICAL HISTORY:  1. Coronary artery disease (see the Feb 21, 2008 note for details).  2. Mildly reduced ejection fraction (40%).  3. Mild chronic renal insufficiency with acute exacerbations.  4. Type 2 diabetes mellitus.  5. Hypertension.  6. Dyslipidemia.  7. Obesity.  8. Peptic ulcer disease with recent GI bleed on January 17, 2008.  9. Chronic obstructive pulmonary disease.  10.Peripheral vascular disease.  11.Allergic rhinitis.  12.Cholecystectomy.  13.Ongoing tobacco use.   ALLERGIES:  Intolerance to SULFA and STATINS.   MEDICATIONS:  1. Omeprazole.  2. Lasix 60 mg daily.  3. Actos 30 mg daily.  4. Glyset 50 mg t.i.d.  5. Micronase 5 mg b.i.d.  6. Coreg 3.125 mg b.i.d.  7. Plavix 75 mg daily.  8. Aspirin 81 mg daily.  9. Spiriva.  10.Isosorbide 30 mg b.i.d.  11.Zetia 10 mg daily.  12.Klor-Con 20 mEq daily.   REVIEW OF SYSTEMS:  As stated in the HPI and otherwise negative for  other systems.   PHYSICAL EXAMINATION:  GENERAL:  The  patient is in no distress.  He does  look chronically ill.  VITAL SIGNS:  Blood pressure 138/69, heart rate 64 and regular, weight  219 pounds, and body mass index 32.  HEENT:  Eyes unremarkable; pupils equal, round, and reactive to light;  fundi not visualized; oral mucosa unremarkable.  NECK:  No jugular venous distention at 90 degrees, carotid upstroke  brisk and symmetric, no bruits, no thyromegaly.  LYMPHATICS:  No cervical, axillary, or inguinal adenopathy.  LUNGS:  Clear to auscultation without wheezing or crackles bilaterally.  He does have decreased breath sounds.  HEART:  S1 and S2 is within normal limits, no S3, distant heart sounds,  no obvious murmurs.  ABDOMEN:  Obese, positive bowel sounds, normal in frequency and pitch,  no bruits, no rebound, no guarding, no midline pulsatile mass, no  hepatomegaly, no splenomegaly.  SKIN:  No rashes, no nodules.  EXTREMITIES:  Pulses 2+, mild bilateral lower  extremity edema.  NEURO:  Oriented to person, place, and time; cranial nerves II through  XII grossly intact; motor grossly intact.   ASSESSMENT AND PLAN:  1. Dyspnea.  The patient has had increased dyspnea, which may well      represent fluid.  He has increased lower extremity edema.  At this      point, I am going to continue him on the Lasix that he is on.      However, I am leery if causing progressive renal insufficiency.  I      am going to have him check a BMET on Monday.  He is going to need      to keep his feet elevated as much as possible.  He will, otherwise,      continue on the meds as listed.  2. Coronary artery disease.  I do not think he is having any active      ischemia.  No further cardiovascular testing is planned.  3. Chronic obstructive pulmonary disease.  He has some chronic lung      disease.  I have encouraged him to see his lung doctor.  [place] O[place],      but he did not drop the sats  below 90 or 89.  I am also not sure      that he does retain CO2 and we want to discuss this with his      pulmonologist.  He has stopped smoking again.  4. End-of-life.  We had a long discussion about end-of-life issues.      He has a living will.  He does not want to be a DNR.  5. Followup.  I am going to see him back in 2 months as it is hard for      him to travel.  We will probably see him in the [place] office.    Rollene Rotunda, MD, [facility]  Electronically Signed   JH/MedQ  DD: 08/22/2008  DT: 08/23/2008  Job #: [id number]   cc:   Madelin Rear. Sherwood Gambler, MD

## 2011-02-15 NOTE — Assessment & Plan Note (Signed)
NAMEBROOKE, [name]                  CHART#:  [id number]   DATE:  06/25/2008                       DOB:  1927-12-26   REFERRING PHYSICIAN:  Madelin Rear. Sherwood Gambler, MD   PRIMARY CARDIOLOGIST:  Rollene Rotunda, MD, [facility]   PROBLEM LIST:  1. Melena on aspirin, Plavix, and Coumadin in April 2009.  2. AFib.  3. Non-ST-elevation MI with drug-eluting stent placed in March 2009.  4. Laparoscopic cholecystectomy in December 2003.  5. Diabetes for 23 years.  6. Hypertension.  7. Hyperlipidemia.  8. History of diarrhea.  9. Allergy to sulfa.   SUBJECTIVE:  The patient is a 77 year old male who presents as a return  patient visit.  He is no longer on Coumadin.  He is only taking aspirin  and Plavix.  He has not seen any black stool that look like tar.  They  did increase his Coreg recently.  He has 1-2 bowel movements a day.   MEDICATIONS:  1. Lasix.  2. Coreg.  3. Glyset.  4. Plavix.  5. Omeprazole 20 mg daily.  6. Micronase.  7. Imdur.  8. Aspirin.  9. Actos.  10.Zetia.  11.Potassium.  12.Temazepam.  13.Xopenex.  14.Spiriva.   OBJECTIVE:   PHYSICAL EXAMINATION:  VITAL SIGNS:  Weight 216 pounds (up 12 pounds  since May 2009), height 5 feet 9 inches, temperature 97.7, blood  pressure 112/64, and pulse 58.GENERAL:  He is in no apparent distress.  Alert and oriented x4.CARDIOVASCULAR:  Irregular rhythm.  LUNGS:  Have fair air movement bilaterally.ABDOMEN:  Bowel sounds are  present.  Soft, nontender, and nondistended.  No rebound, no guarding.  Obese.NEURO:  He needs assistance to get on the exam table and walks  with a cane.   ASSESSMENT:  The patient is a 77 year old male who was initially seen by  me in April 2009 for melena while on Coumadin.  He had a 6- to 8-mm  duodenal ulcer and patchy erythema in the antrum.  He had colonoscopy  greater than 10 years ago. Thank you for allowing me to see the patient  in consultation.  My recommendations follow.   RECOMMENDATIONS:  1.  Consider colonoscopy in 6-12 months if he continues to have      persistent anemia.  2. Follow up visit in 6 months.  3. He should continue to take the omeprazole for prophylaxis against      gastric and duodenal ulcers as long as he is on aspirin daily.       Kassie Mends, M.D.  Electronically Signed     SM/MEDQ  D:  06/25/2008  T:  06/26/2008  Job:  [id number]   cc:   Madelin Rear. Sherwood Gambler, MD  Rollene Rotunda, MD, [facility]

## 2011-02-15 NOTE — Group Therapy Note (Signed)
[name], [name]                 ACCOUNT NO.:  [id number]   MEDICAL RECORD NO.:  [id number]          PATIENT TYPE:  INP   LOCATION:  A222                          FACILITY:  [facility]   PHYSICIAN:  Skeet Latch, DO    DATE OF BIRTH:  1928-03-01   DATE OF PROCEDURE:  DATE OF DISCHARGE:                                 PROGRESS NOTE   SUBJECTIVE:  Mr. Heldman is sitting in chair. Looks comfortable.  Denies  any chest pain, shortness of breath or abdominal discomfort at this  time.  Family is present on exam.  Patient told he is going to [facility] for a possible cardiac catheterization.   OBJECTIVE:  VITAL SIGNS:  Temperature 98.4, pulse 71, respirations 23,  blood pressure 114/61.  He is satting 94% on room air.  HEART:  Regular rate and rhythm.  LUNGS:  Mild rhonchi.  Decreased breath sounds bilaterally.  ABDOMEN:  Obese, soft, nontender, nondistended.  EXTREMITIES:  Trace edema.  NEUROLOGIC:  He is alert and oriented x3   LABORATORY DATA:  Sodium 138, potassium 5.0, chloride 102, CO2 - 26,  glucose 127, BUN 55, creatinine 1.84.  His PT is 19.1, INR 1.5.   ASSESSMENT/PLAN:  1. Acute exacerbation of chronic heart failure.  Patient had some      elevated cardiac enzymes last few days for some cardiac injury.      Cardiology saw patient today. Feel patient probably needs to go to      [facility] for a cardiac catheterization.  Pre-catheterization      orders per Dr. Tenny Craw will be ordered today.  2. Hyperglycemia.  He is on glycemic protocol.  Will adjust as needed.  3. Chronic obstructive pulmonary disease.  Seems to be stable.  Will      continue to decrease his steroids.  4. Atrial fibrillation.  Rate control.  Patient will be kept on      Lovenox and his Coumadin discontinued at this time.  5. Anticipate patient probably being discharged to [facility] for      cardiac catheterization on December 25, 2007.      Skeet Latch, DO  Electronically Signed     SM/MEDQ  D:   12/24/2007  T:  12/24/2007  Job:  [id number]

## 2011-02-15 NOTE — Consult Note (Signed)
[name], [name]                 ACCOUNT NO.:  [id number]   MEDICAL RECORD NO.:  [id number]          PATIENT TYPE:  INP   LOCATION:  IC04                          FACILITY:  [facility]   PHYSICIAN:  Thomas C. Wall, MD, FACCDATE OF BIRTH:  [birth year]   DATE OF CONSULTATION:  02/18/2009  DATE OF DISCHARGE:                                 CONSULTATION   CARDIOLOGIST:  Dr. Rollene Rotunda.   PRIMARY CARE PHYSICIAN:  Dr. Sherwood Gambler.   REFERRING PHYSICIAN:  Dr. Dorris Singh of the [facility]  [facility].   REASON FOR CONSULTATION:  Congestive heart failure.   HISTORY OF PRESENT ILLNESS:  [name] is a 77 year old male patient  with a history ischemic cardiomyopathy with an EF of 40% chronic  systolic congestive heart failure, who was just discharged from [facility] on Feb 13, 2009, after being admitted with acute on  chronic systolic congestive heart failure and a large left pleural  effusion.  He required intubation and chest tube placement with  thoracentesis.  His hospital stay was complicated by delirium and  urinary retention, thought to be secondary to his psychotropic  medications.  He required Foley placement.  He was eventually discharged  to skilled nursing to the [facility].  He had labs drawn recently that  demonstrated lower than baseline hemoglobin and he was sent to [facility] for transfusion with packed red blood cells.  The patient  notes weakness, but he denies any increase in shortness of breath.  He  denies orthopnea.  He notes he has a history of paroxysmal nocturnal  dyspnea.  There has been no significant change.  He denies chest pain or  cough.  His chest x-ray demonstrates improved but persistent left air  space disease.  He is currently receiving packed red blood cells.  We  have been asked to further evaluate and participate in his care.   PAST MEDICAL HISTORY:  1. Coronary artery disease.      a.     Status post Primus drug-eluting  stent x2 to the RCA in March       2009.      b.     Status post Primus drug-eluting stent to the circumflex in       March 2009.  2. Severe cardiomyopathy with an EF of 40% required.  3. Chronic congestive heart failure.  4. Permanent atrial fibrillation.      a.     He is not a Coumadin candidate secondary to prior history of       GI bleeding.  5. Echocardiogram on December 21, 2007, demonstrating an EF of 40%,      akinesis of the posterolateral wall, moderate to severe hypokinesis      of the anteroseptal wall, mild mitral regurgitation, mild to      moderately dilated left atrium, mild RVH, mild-moderate dilated      right atrium.  6. COPD.  7. Chronic kidney disease.  8. Chronic anemia with a hemoglobin averaging 8.  9. Diabetes mellitus.  10.Hypertension.  11.Hyperlipidemia.  12.Chronic dyspnea.  13.Obesity.  14.Peptic ulcer disease with prior GI bleeding, April 2009.  15.Peripheral arterial disease.  16.Status post cholecystectomy.   ALLERGIES:  1. SULFA.  2. IV DYE.  3. STATINS.   MEDICATIONS AT SKILLED NURSING FACILITY:  1. Flomax 0.4 mg daily.  2. Isosorbide mononitrate 30 mg daily.  3. Aspirin 81 mg daily.  4. Plavix 75 mg daily.  5. Carvedilol 6.25 mg b.i.d.  6. Hydralazine 75 mg two times a day.  7. Pepcid 40 mg b.i.d.  8. Pulmicort b.i.d.  9. Furosemide 40 mg b.i.d.  10.Xopenex q.6 h., daily.  11.Paxil 5 mg nightly.  12.Nu-Iron three times a day.   SOCIAL HISTORY:  The patient lives at [facility].  He is married.  He  has a 50 pack-year history of smoking.  He is retired from [employer].   FAMILY HISTORY:  Significant for premature CAD.   REVIEW OF SYSTEMS:  Please see HPI.  Denies fevers, dysuria or  hematuria.  He has noted bright red blood per rectum on the tissue only,  denies melena.  Denies dysphagia.  Denies syncope.  Denies cough.  Denies lower extremity edema.  All other systems reviewed and negative.   PHYSICAL EXAMINATION:  GENERAL:   He is a chronically ill-appearing male  in no acute stress.  VITAL SIGNS:  Blood pressure 89/56, pulse 70, respirations 17,  temperature 98.2.  Oxygen saturation 95% on 3 liters.  HEENT:  Normal.  NECK:  With mild JVD.  LYMPHS:  Lymphadenopathy.  ENDOCRINE:  Without thyromegaly.  CARDIAC:  Irregularly irregular S1-S2.  No murmur.  LUNGS:  With decreased breath sounds at the left base.  No wheezing.  SKIN:  Warm and dry.  ABDOMEN:  Soft, nontender with normoactive bowel sounds.  No  organomegaly.  EXTREMITIES:  With trace edema bilaterally.  MUSCULOSKELETAL:  Without joint deformity.  NEUROLOGIC:  He is alert and oriented x3.  Cranial nerves II-XII grossly  intact.  VASCULAR:  He has a right sided carotid bruit noted.   Acute abdominal series demonstrates improved left-sided aeration with  persistent pleural fluid/thickening and adjacent air space disease, but  no significant evidence of bowel obstruction on abdominal films.  EKG -  atrial fibrillation with a heart rate of 66 left axis deviation,  inferior Q-waves, poor R wave progression, nonspecific ST-T wave changes  and interventricular conduction delay.   LABORATORY DATA:  Hemoglobin 6.7, MCV 84.7, potassium 3.6, creatinine  2.2, glucose 196, albumin 2.6.  Point of care markers negative x1.  INR  1.2.   ASSESSMENT:  1. Profound anemia in this 77 year old male with a history of chronic      anemia in the setting of multiple chronic medical problems and      prior history of gastrointestinal bleeding.  He is currently being      evaluated by gastroenterology and it appears that plans are for a      colonoscopy and possible endoscopy in the next 1-2 days.  He is      currently receiving packed red blood cells.  2. Coronary artery disease.      a.     Status post drug-eluting stent placement to the right       coronary artery and circumflex in March of 2009.  3. Chronic systolic congestive heart failure secondary to ischemic       cardiomyopathy with an ejection fraction of 40%.  He was recently      admitted with  acute on chronic heart failure requiring intubation      and thoracentesis.  He is still somewhat volume overloaded, but is      improved from his prior admission and stable.  4. Chronic obstructive pulmonary disease.  5. Chronic kidney disease.  6. Hypertension.  7. Diabetes mellitus.  8. Hyperlipidemia.  9. Permanent atrial fibrillation.      a.     He is not a Coumadin candidate secondary to history of GI       bleeding.   RECOMMENDATIONS:  The patient was also interviewed and examined by Dr.  Daleen Squibb.  Prior records have been reviewed.  He has chronic anemia but has  had a decrease from his baseline.  The etiology is not entirely certain  at this time.  He seems to be stable from a heart failure standpoint.  His left effusion is persistent, but stable/reduced.  His current Lasix  therapy should be continued and Lasix should be given intravenously  between each unit of blood.  As the patient has had drug-eluting stent  placement approximately 1 year ago, he should at least remain on aspirin  and that will be continued.  Ideally, he should be on aspirin and  Plavix, but will hold off on reinitiating Plavix until it is felt to be  safe by his primary service and gastroenterology.  Careful ins and outs  should be maintained.  He is to remain on telemetry.   Thank very much for the consultation.  We will be happy to follow the  patient throughout the remainder of this admission.      Tereso Newcomer, PA-C      Jesse Sans. Daleen Squibb, MD, [facility]  Electronically Signed    SW/MEDQ  D:  02/19/2009  T:  02/19/2009  Job:  [id number]   cc:   Madelin Rear. Sherwood Gambler, MD  Fax: [phone]   Dorris Singh, DO

## 2011-02-15 NOTE — H&P (Signed)
NAMEELMER, [name]                 ACCOUNT NO.:  [id number]   MEDICAL RECORD NO.:  [id number]          PATIENT TYPE:  INP   LOCATION:  A222                          FACILITY:  [facility]   PHYSICIAN:  Dorris Singh, DO    DATE OF BIRTH:  [birth year]   DATE OF ADMISSION:  12/21/2007  DATE OF DISCHARGE:  LH                              HISTORY & PHYSICAL   HISTORY:  The patient is a 77 year old Caucasian male who presented to  the [facility] emergency room with a chief complaint of shortness of  breath.  He has had a history of this previously, it started that day,  and it began to get worse and nothing made it better, and nothing  relieved it.  He has a significant history of cigarette smoking COPD,  coronary artery disease as well.   PAST MEDICAL HISTORY:  1. Status post cardiac catheterization in September 2005.  2. Diabetes.  3. Hypertension.  4. Hyperlipidemia.  5. Recent tobacco abuse.  6. Obesity.  7. Family history of coronary artery disease.  8. History of pulmonary edema.  9. Peptic ulcer disease.  10.Pancreatitis and cholelithiasis.  11.COPD.  12.Degenerative joint disease.  13.PVD.   SURGICAL HISTORY:  1. Status post catheterization  2. Cholecystectomy.  3. EGD.   SOCIAL HISTORY:  The patient lives with his wife currently.  He is  retired from [employer].  He admits to a 50 pack year history of  tobacco use, and quit September 12, this last year.   ALLERGIES:  He has allergic reaction to SULFA medications and STATINS.   CURRENT MEDICATIONS INCLUDE:  1. Metformin XR 500 mg twice a day.  2. Isosorbide mono nitrate 30 mg twice a day.  3. Potassium chloride 20 mEq once a day.  4. Glyburide 5 mg three times a day.  5. Zetia 10 mg once a day.  6. Lasix 40 mg once a day.  7. Warfarin 12 mg specialized dosing.  8. Klor-Con 20 mEq 1 tablet once a day.  9. Vigamox ophthalmic specialized dosing.   REVIEW OF SYSTEMS:  GENERAL:  Negative for weakness.  EYES:   Negative  for eye pain.  EARS, NOSE, MOUTH AND THROAT:  All within normal limits.  HEART:  Positive for dyspnea. LUNGS:  Positive for wheezing, shortness  of breath. ABDOMEN:  No nausea, vomiting, diarrhea, or constipation. GU:  No hematuria or frequency.  MUSCULOSKELETAL:  No arthralgias.   PHYSICAL EXAMINATION:  VITAL SIGNS:  The patient was placed on BiPAP in  the ED; currently is pulse 102, respirations are 18.  Eventually he was  weaned down to 2 liters nasal cannula after placement of BiPAP.  GENERAL:  The patient is a 77 year old male who is well-developed and  well-nourished.  He was  in mild distress.  EYES:  PERRL.  EOMI.  NOSE:  Turbinates moist.  THROAT:  No exudate or erythema.  NECK:  Supple.  No lymphadenopathy.  HEART:  Regular rate and rhythm.  No gallops or murmurs noted, but  distant heart sounds S1-S2.  LUNGS:  Decreased heart  sounds.  Positive inspiratory and expiratory  rales.  ABDOMEN:  Obese, soft, nontender.  EXTREMITIES:  Positive +1 edema.  No ecchymosis or cyanosis.   LABS ON ADMISSION INCLUDE:  A white count of 14.9, hemoglobin of 13.2,  hematocrit 38.5, platelet count of 279.  Chemistry:  Sodium 138,  potassium 4.8, chloride 105, CO2 25, glucose 318, BUN 36, creatinine  1.45, and his BNP is 481.   ASSESSMENT AND PLAN:  1. Acute exacerbation of congestive heart failure.  The patient will      be admitted to the ICU due to the fact that he is on BiPAP.  Since      he has improved from the respiratory standpoint, we will go ahead      and transfer him out.  Also will start diuretic therapy and will      obtain labs in the morning.  Will get a 2-D echo, and will have      [place] cardiology, whom he states is his cardiologist, come and      evaluate him.  We will get daily weights, I&Os, and start him on IV      Lasix.  2. For his diabetes we will put him on a sliding scale and continue to      monitor him.  3. Hypertension.  Will place him on his home  medications as well as      the hyperlipidemia.  The patient has recently had cataract surgery.      He has eye shield on.  We will place him on his home medications      for that as well.  Will continue to monitor him, and change therapy      as noted.      Dorris Singh, DO  Electronically Signed     CB/MEDQ  D:  12/24/2007  T:  12/24/2007  Job:  [id number]

## 2011-02-15 NOTE — Assessment & Plan Note (Signed)
[facility]                            CARDIOLOGY OFFICE NOTE   [name], [name]                        MRN:          [id number]  DATE:06/19/2007                            DOB:          02-26-1928    PRIMARY:  Dr. Artis Delay   REASON FOR PRESENTATION:  Evaluate patient with shortness of breath and  coronary disease.   HISTORY OF PRESENT ILLNESS:  The patient is a 77 year old gentleman with  coronary disease as described below.  He had a recent acute-on-chronic  exacerbation of dyspnea and went to the emergency room at [facility].  I  do note that he was in atrial fibrillation at that time.  He was treated  with bronchodilators and discharged home.  A CT apparently demonstrated  no pulmonary embolism.  He had routine followup scheduled today.  He  said he has been less dyspneic since that ER visit.  He has baseline  dyspnea with minimal exertion.  He does not have any PND or orthopnea.  He is not noticing any palpitations and has had no presyncope or  syncope.  He has had no chest discomfort, neck or arm discomfort.  Of  note, because this episode scared him, he has stopped smoking!   PAST MEDICAL HISTORY:  1. Coronary artery disease (left main normal, LAD 60% proximal      stenosis, mid long 25% stenosis, proximal calcification, circumflex      long proximal 30-40% stenosis, long branching first obtuse      marginal, the right coronary artery is dominant, there was a long      proximal 30% stenosis, there was a mid long 80% stenosis before the      acute marginal and 75% stenosis after the acute marginal.  There      was moderate diffuse disease after the PDA).  2. Normal left ventricular function.  3. Hypertension.  4. Diabetes mellitus.  5. Emphysema.  6. Degenerative joint disease.  7. Peripheral vascular disease (greater than 80% right carotid      stenosis, 60-79% left carotid stenosis, 0.6 bilateral ankle      brachial indices; he  has refused surgery).   ALLERGIES AND INTOLERANCES:  SULFA.  He has been intolerant of STATINS.   MEDICATIONS:  1. Klor-Con.  2. Zetia 10 mg daily.  3. Metformin 1000 mg b.i.d.  4. Isosorbide 30 mg b.i.d.  5. Glyburide 5 mg daily.  6. Furosemide 40 mg daily.   REVIEW OF SYSTEMS:  As stated in the HPI and otherwise negative for  other systems.   PHYSICAL EXAMINATION:  The patient is in no distress.  Blood pressure  134/63, heart rate 102 and irregular, weight 200 pounds, body mass index  30.  HEENT:  Eyelids unremarkable.  Pupils equal, round, and react to light.  Fundi not visualized.  Oral mucosa unremarkable.  NECK:  No jugular venous distention.  Waveform within normal limits.  Carotid upstroke brisk and symmetric.  No bruits, no thyromegaly.  LYMPHATICS:  No cervical, axillary or inguinal adenopathy.  LUNGS:  Decreased breath sounds without  wheezing or crackles.  BACK:  No costovertebral angle tenderness.  CHEST:  Unremarkable.  HEART:  PMI not displaced or sustained.  S1 and S2 within normal limits.  No S3, no murmurs.  ABDOMEN:  Obese, positive bowel sounds normal in frequency and pitch.  No bruits, no rebound, no guarding.  No midline pulsatile mass.  No  organomegaly.  SKIN:  No rashes, no nodules.  EXTREMITIES:  Show 2+ upper pulses, 2+ femorals, 1+ dorsalis pedis and  posterior tibialis bilaterally, mild bilateral lower extremity edema.  NEUROLOGIC:  Grossly intact.   EKG:  Atrial fibrillation, left axis deviation, left anterior fascicular  block, anterolateral T-wave inversions, unchanged from previous.   ASSESSMENT AND PLAN:  1. Atrial fibrillation.  The patient has atrial fibrillation but is      not noticing the palpitations.  This might explain his increased      dyspnea and lower extremity swelling.  At this point I am going to      start him on Coumadin.  He is reluctant to take any new      medications.  In the future I am going to try to convince him to  at      least be on a low dose of a beta blocker if this will not      exacerbate his lung disease.  He is probably going to need better      rate control.  He does agree to the Coumadin.  He understands the      risks and benefits as his wife is on this.  I called Dr. Sherwood Gambler, who      says he will monitor his Coumadin level.  When he has been on the      Coumadin therapeutic for 4 weeks I will plan a cardioversion to see      if he feels less dyspneic and has less swelling in sinus rhythm.      If he has any increasing edema he will let me know and he can take      a higher dose of furosemide.  He is to avoid salt and fluid.  2. Coronary disease is being managed medically.  3. Peripheral vascular disease.  He refuses interventional therapy and      he will be managed medically.  Will continue with risk reduction.  4. Dyslipidemia.  He does not want to take statins.  He does agree to      [employer].  5. Tobacco.  I am very excited that he stopped smoking and encouraged      him to remain abstinent.  6. Followup.  I would like to see him back here in about 4 weeks.  I      would also like to get an echocardiogram.  I will check a PT and      TSH level.  Of note, he had labs recently that      demonstrated no anemia.  He has some mild renal insufficiency with      a creatinine of 1.26.  He had a normal potassium.  His BNP level      was very slightly elevated.     Rollene Rotunda, MD, [facility]  Electronically Signed    JH/MedQ  DD: 06/19/2007  DT: 06/20/2007  Job #: [id number]   cc:   Madelin Rear. Sherwood Gambler, MD

## 2011-02-15 NOTE — Group Therapy Note (Signed)
NAMEADITH, TEJADA                 ACCOUNT NO.:  [id number]   MEDICAL RECORD NO.:  [id number]          PATIENT TYPE:  INP   LOCATION:  IC04                          FACILITY:  [facility]   PHYSICIAN:  Dorris Singh, DO    DATE OF BIRTH:  [birth year]   DATE OF PROCEDURE:  02/19/2009  DATE OF DISCHARGE:                                 PROGRESS NOTE   The patient seen today looking much more improved.  He is scheduled for  an EGD.   CURRENT VITALS:  Temperature 98, pulse 71, respirations 13, blood  pressure 121/48.  GENERAL:  The patient is well-developed, well-nourished, in no acute  distress.  HEART:  Regular rate and rhythm.  LUNGS:  Clear to auscultation bilaterally.  ABDOMEN:  Soft, nontender, nondistended.  EXTREMITIES:  Positive pulses with some +1 edema.   LABORATORY DATA:  White count of 8.4, hemoglobin 9.4, hematocrit 25.0,  platelet count of 267.  Sodium is 143, potassium 3.4, chloride 103, CO2  32, glucose 72, BUN 30 and creatinine 1.87, and his BNP is 221.   ASSESSMENT/PLAN:  1. Anemia from a gastrointestinal bleed.  The patient will be scoped      this afternoon.  Will see if this is the source of where he is      having problems.  2. Renal failure.  This is resolving slowly.  3. Urinary tract infection.  We are treating this accordingly.   As soon as this the patient is stable from his EGD, will plan on  transferring him out of the ICU.      Dorris Singh, DO  Electronically Signed     CB/MEDQ  D:  02/19/2009  T:  02/19/2009  Job:  [id number]

## 2011-02-15 NOTE — Consult Note (Signed)
[name], [name]                 ACCOUNT NO.:  [id number]   MEDICAL RECORD NO.:  [id number]          PATIENT TYPE:  INP   LOCATION:  IC04                          FACILITY:  [facility]   PHYSICIAN:  Kassie Mends, M.D.      DATE OF BIRTH:  02/16/1928   DATE OF CONSULTATION:  01/18/2008  DATE OF DISCHARGE:                                 CONSULTATION   REFERRING PHYSICIAN:  Skeet Latch, DO.   REASON FOR CONSULTATION:  Melena.   HISTORY OF PRESENT ILLNESS:  [name] is a 77 year old male who had a  non-ST elevation MI and a drug-eluting stent placed in March 2009.  He  has been maintained on aspirin, Plavix and Coumadin.  Also carries a  diagnosis of atrial fibrillation.  He states ever since he had a stent,  he has had black stool at least once a day.  Has not been vomiting any  blood.  He denies any bright red blood per rectum.  Last colonoscopy was  over 10 years ago.  He did have an upper endoscopy at [facility] 5 years  ago for an unknown reason.  He denies any heartburn, indigestion,  problems swallowing or abdominal pain.  He was lightheaded after he  started to the medicine but now is not having that problem.  He denies  any syncope, chest pain, shortness of breath, paroxysmal nocturnal  dyspnea or fever.   PAST MEDICAL HISTORY:  1. Diabetes.  2. Hypertension.  3. Hyperlipidemia.   PAST SURGICAL HISTORY:  Cholecystectomy secondary to gallstones.   ALLERGIES:  SULFA and STATINS.   MEDICATIONS:  Ventolin, aspirin, Coreg, Plavix, Zetia, Lasix, Diabeta,  NovoLog sliding scale, Lantus, Atrovent, Imdur, Protonix, K-Dur.   FAMILY HISTORY:  He has no family history of colon cancer or colon  polyps.   SOCIAL HISTORY:  He is married and retired from [employer].  He does not  use any tobacco.   REVIEW OF SYSTEMS:  He complains of insomnia which has been effectively  treated with Remeron.  Otherwise the review of systems as previously  mentioned or per the HPI; otherwise all  systems are negative.   PHYSICAL EXAMINATION:  VITAL SIGNS: Afebrile, hemodynamically stable.  Normal sinus rhythm on monitor.  GENERAL:  In general he is in no  apparent distress, alert and orient x4.  HEENT is atraumatic,  normocephalic.  Pupils equal, reactive to light.  Sclerae anicteric  bilaterally.  His mouth has no oral lesions.  NECK:  Full range of  motion.  No lymphadenopathy.  LUNGS:  Clear to auscultation bilaterally.  CARDIOVASCULAR:  Shows regular rhythm, no murmur.  ABDOMEN:  Bowel  sounds are present, soft, nontender, nondistended.  EXTREMITIES:  Have  no cyanosis or edema.  NEUROLOGIC:  He has no focal neurologic deficits.   LABORATORY DATA:  INR 2.9, hemoglobin 10.1 after 2 units of packed red  blood cells.  Creatinine 1.68, albumin 3.2, total bili 0.6, AST, ALT and  alk phos normal.  The beta natriuretic peptide 241.  UA negative.  Chest  x-ray shows vascular congestion but no focal  air space disease or  effusion.   ASSESSMENT:  [name] is a 77 year old male with comes in with melena.  He has a past medical history of atrial fibrillation, non-ST elevation  MI and drug-eluting stent placement in March 2009.  He has been  maintained on aspirin, Plavix and Coumadin as an outpatient.  His INR is  currently greater than 2.  The differential diagnosis for his melena  includes peptic ulcer disease, Dieulafoy's lesion, and low likelihood of  a gastric or small bowel tumor.   Thank you for allowing me to see [name].  My recommendations follow.   RECOMMENDATIONS:  1. Give vitamin K 5 mg IV now and 1 unit FFP to correct his coags less      than 2.  2. May continue aspirin and Plavix at this point because the benefits      of aspirin and Plavix outweigh the risks.  3. Continue Protonix and make him n.p.o. except for medicines.  4. Stat PT/INR after the FFP.  5. EGD today at bedside in the ICU.      Kassie Mends, M.D.  Electronically Signed     SM/MEDQ  D:   01/18/2008  T:  01/18/2008  Job:  [id number]   cc:   Madelin Rear. Sherwood Gambler, MD  Fax: [phone]   Rollene Rotunda, MD, [facility]  [address]  [address]  [place]  [place] [address]

## 2011-02-15 NOTE — Group Therapy Note (Signed)
[name]                 ACCOUNT NO.:  [id number]   MEDICAL RECORD NO.:  [id number]          PATIENT TYPE:  INP   LOCATION:  IC04                          FACILITY:  [facility]   PHYSICIAN:  Ryan Latch, DO    DATE OF BIRTH:  May 05, 1928   DATE OF PROCEDURE:  01/18/2008  DATE OF DISCHARGE:                                 PROGRESS NOTE   SUBJECTIVE:  [name] is sitting up upon my exam.  The patient states  that he is feeling better.  He feels stronger.  The patient denies any  chest pain, shortness of breath, nausea or vomiting, diarrhea at this  time.  The patient is status post EGD today and overall the patient  seems to be improving.   OBJECTIVE:  GENERAL:  The patient __________in no acute distress.  VITAL SIGNS:  Temperature 97.1, pulse 57, respirations 16, blood  pressure 127/52, satting 100% on room air.  CARDIOVASCULAR:  Irregular rate and rhythm.  No murmurs.  LUNGS:  Clear to auscultation bilaterally.  No rales, rhonchi or  wheezing.  ABDOMEN:  Obese.  Nontender, nondistended.  EXTREMITIES:  The patient did have some right lower calf pain that seems  to be improved.  No erythema noted.   LABS:  Total creatinine kinase 60, CK-MB 2.6, troponin 0.04.  Hemoglobin  9.4, hematocrit 26.3, PT 22.9, INR 1.9.  Phosphorus 4.4, magnesium 2.3,  sodium 135, potassium 3.9, chloride 102, CO2 26, glucose 109, BUN 47,  creatinine 1.68, total bilirubin 0.6, alkaline phosphatase 58, AST 70,  ALT 14, total protein 5.8, albumin 3.2.  BNP is 241.   RADIOLOGIC STUDIES:  Venous ultrasound lower extremities showed no  evidence of DVT, showed questionable elevated right heart pressures.  CT  of the abdomen and pelvis showed 1) increased stool in colon,  questionable constipation, 2) Minimal nodules part of the liver, cannot  exclude cirrhosis, 3) vascular calcification versus nonobstructing  calculi bilateral kidneys, 4) bibasilar pleural effusions atelectasis.  The pelvis showed 1) no  acute anterior pelvic abnormalities, 2) no cause  for abdominal distention.  Chest x-ray showed cardiomegaly, pulmonary  vascular congestion.   ASSESSMENT AND PLAN:  1. Melena secondary to a duodenal ulcer.  The patient is being      followed by gastroenterology.  EGD found the duodenal ulcer.  Will      continue the patient on aspirin and Plavix secondary to his recent      cardiac stents.  Helicobacter pylori has been ordered.  His proton      pump inhibitor has been changed to twice daily.  Continue to      monitor his hemoglobin and hematocrit and advance his diet.  They      will consider colonoscopy in 12-18 months considering he needs to      be on aspirin and Plavix _________.  2. Type 2 diabetes with hyperglycemia, seems to be stable at this      time.  Place the patient on his home medication.  The patient will      continue to be on  a sliding scale and blood sugar needs to be      checked a.c. and h.s.  3. History of coronary artery disease.  This is followed by cardiology      and they  have recommended keeping the patient on his Plavix and      aspirin at this time.  The patient did receive fresh frozen plasma      to reverse the effects      of Coumadin.  Will continue to monitor at this time.  4. COPD (chronic obstructive pulmonary disease) and hyperlipidemia.      The patient is on nebulizer treatments as needed and he will      continue to be on Zetia daily.  The patient will continue his other      home medications.      Ryan Latch, DO  Electronically Signed     SM/MEDQ  D:  01/18/2008  T:  01/18/2008  Job:  [id number]

## 2011-02-15 NOTE — Assessment & Plan Note (Signed)
[facility]                            CARDIOLOGY OFFICE NOTE   [name], [name]                        MRN:          [id number]  DATE:07/15/2009                            DOB:          1928-04-17    PRIMARY DOCTOR:  Madelin Rear. Fusco, MD   REASON FOR PRESENTATION:  Evaluate the patient with atrial fibrillation  and coronary disease.   HISTORY OF PRESENT ILLNESS:  The patient presents for followup of the  above.  Since I last saw him, he has done well.  He has had no new  shortness of breath.  His lower extremity swelling is minimal.  He has  had no presyncope or syncope.  He denies any chest pain.  He is able to  sleep in a regular bed and denies any PND or orthopnea.  Unfortunately,  he continues to smoke cigarettes.   PAST MEDICAL HISTORY:  Respiratory failure (secondary to pleural  effusion, severe COPD, ongoing tobacco abuse, and congestive heart  failure), heart block with sinus pauses (status post [employer] pacemaker  by Dr. Graciela Husbands), hospital-induced delirium, chronic renal insufficiency,  chronic atrial fibrillation (no Coumadin secondary to GI bleeding),  diabetes mellitus, coronary artery disease (last catheterization in 2009  demonstrate a 3-vessel CAD).  He underwent stenting with a right  coronary artery with a PROMUS drug-eluting stent.  He also had a  stenting of his circumflex with drug-eluting stent previously),  cardiomyopathy (EF 40%), degenerative joint disease, hypertension,  hyperlipidemia, peptic ulcer disease, and peripheral vascular disease.   ALLERGIES:  SULFA, STATINS, and COUMADIN.   MEDICATIONS:  1. Lasix 80 mg b.i.d.  2. Glipet 50 t.i.d.  3. Omeprazole 20 mg daily.  4. Imdur 30 mg b.i.d.  5. Aspirin 81 mg daily.  6. Zetia 10 mg daily.  7. Glyburide 5 mg b.i.d.  8. Hydralazine 12.5 mg t.i.d.  9. Uroxatral 10 mg daily.  10.Iron 150 mg t.i.d.  11.Lorazepam.  12.Xopenex.  13.Spiriva.   REVIEW OF SYSTEMS:  As  stated in the HPI, and otherwise negative for all  other systems.   PHYSICAL EXAMINATION:  GENERAL:  The patient is in no distress.  VITAL SIGNS:  Blood pressure 110/56, heart rate 66 and regular, weight  188 pounds, body mass index 28.  HEENT:  Eyes are unremarkable.  Pupils are equal, round, and reactive to  light.  Fundi not visualized.  Oral mucosa unremarkable.  Edentulous.  NECK:  No jugular venous distention at 45 degrees.  Carotid upstroke  brisk and symmetrical.  No bruits, no thyromegaly.  LYMPHATICS:  No  adenopathy.  LUNGS:  Decreased breath sounds with few expiratory wheezes, but no  dullness to percussion.  BACK:  No costovertebral angle tenderness.  CHEST:  Well-healed pacemaker scar.  HEART:  PMI not displaced or sustained.  S1 and S2 within normal limits.  No S3, no clicks, no rubs, no murmurs.  ABDOMEN:  Obese.  Positive bowel sounds.  Normal in frequency and pitch.  No bruits, no rebound, no guarding, no midline pulsatile mass, no  hepatomegaly, no splenomegaly.  SKIN:  No rashes, no nodules.  EXTREMITIES:  2+ pulses, upper 1+ pulse, bilateral dorsalis pedis and  post tibialis, trace left greater than right lower extremity edema,  chronic venous stasis changes.  NEURO:  Oriented to birth, place, and time.  Cranial nerves grossly  intact.  Motor grossly intact.   EKG; atrial fibrillation, rate 66, left axis deviation, interventricular  conduction delay, no acute ST-T wave changes.   ASSESSMENT AND PLAN:  1. Atrial fibrillation.  He is tolerating this rhythm with rate      control.  He cannot take Coumadin.  He is on an aspirin only.  2. Coronary artery disease.  He has no ongoing symptoms.  He will      continue with risk reduction.  3. Cardiomyopathy.  The patient does have a mildly reduced ejection      fraction with diastolic dysfunction as well.  He seems to be      euvolemic, and on a good regimen.  He will continue with this.  4. Tobacco.  He has no  desire at this point to quit smoking, though I      talked about this at every visit.  5. Followup.  I will see him again in 6 months or sooner if needed.     Rollene Rotunda, MD, [facility]  Electronically Signed    JH/MedQ  DD: 07/15/2009  DT: 07/16/2009  Job #: [id number]   cc:   Madelin Rear. Sherwood Gambler, MD

## 2011-02-15 NOTE — H&P (Signed)
NAMEREISE, GLADNEY NO.:  [id number]   MEDICAL RECORD NO.:  [id number]          PATIENT TYPE:  INP   LOCATION:  4705                         FACILITY:  [facility]   PHYSICIAN:  Pricilla Riffle, MD, FACCDATE OF BIRTH:  07/23/1928   DATE OF ADMISSION:  03/09/2009  DATE OF DISCHARGE:                              HISTORY & PHYSICAL   PRIMARY CARDIOLOGIST:  Rollene Rotunda, MD, [facility]   PRIMARY CARE PHYSICIAN:  Madelin Rear. Fusco, MD   REASON FOR ADMISSION:  Altered mental status and worsening shortness of  breath.   HISTORY OF PRESENT ILLNESS:  This is a 77 year old male with history of  systolic congestive heart failure secondary to ischemic cardiomyopathy  with ejection fraction of 40%, chronic atrial fibrillation (not a  Coumadin candidate secondary to a history of GI bleed), CAD, S/P 2 drug-  eluting stents to RCA and circumflex artery, COPD with ongoing tobacco  disorder, chronic anemia with recent exacerbation requiring 3 units of  blood at [facility] and hemoglobin down to 6.?, and chronic  renal insufficiency with baseline creatinine of approximately 1.5,  presenting with a brief episode of altered mental status in the setting  of worsening CHF.  The patient was unresponsive for approximately 2-3  minutes today and nearly fell off the toilet.  The patient had been  seated there by his home health nurse in preparation for assisting him  with bathing when the patient began to keel over and was only kept from  falling by his home health nurse.  The patient's eyes were open, but he  was unresponsive for approximately 3 minutes.  This was witnessed by the  home health nurse and his wife.  He did not have any uncontrolled  movements, tongue biting, loss of bowel or bladder during this episode.  Afterwards, he was back to his normal cognitive state.  Also afterwards.  He had no neural deficits that were noted.  Currently, the patient seems  to be approximately  at baseline except he has a productive cough that  has been worsening over the last 2 weeks.  Also, he has worsening lower  extremity edema and he reports worsening of his chronic shortness of  breath.  He also reports starting back smoking, approximately half pack  per day over the last couple of weeks.   PAST MEDICAL HISTORY:  1. CAD, last cath 2009, showing triple-vessel disease, DES placement      to RCA (PROMUS), history of stent in the circumflex artery with      DES.  2. COPD.      a.     Ongoing tobacco abuse disorder.  3. Renal insufficiency (baseline creatinine 1.5).  4. Chronic anemia.  5. History of degenerative disease.  6. Diabetes mellitus, type 2.  7. Hypertension.  8. Hyperlipidemia.  9. Chronic atrial fibrillation (not a Coumadin candidate secondary to      history of GI bleed).  10.Peptic ulcer disease.  11.Peripheral vascular disease.  12.Cholecystitis with his S/P cholecystectomy.   SOCIAL HISTORY:  The patient lives in [place] with his  wife.  He is  retired.  He has a 60-pack year smoking history, currently smoking half  pack per day.  No EtOH or illicit drug use or herbal medication.  He has  a regular diet and is unable to regularly exercise secondary to his  comorbidities.   FAMILY HISTORY:  Noncontributory secondary to the patient's advanced  age.   REVIEW OF SYSTEMS:  The patient reports 1 episode of chills last week  but none since.  He reports occasional fogginess with his vision over  the last couple of months.  He denies any chest pain; however, he has  had worsening of his shortness of breath and dyspnea on exertion, as  well as his orthopnea, and his lower extremity edema.  He denies  palpitations.  He also reports productive cough worsening over the last  2 weeks, questionable melena as his stool was dark secondary to chronic  iron sulfate supplementation.  Otherwise see HPI.  All other systems  reviewed and were negative.    ALLERGIES/INTOLERANCES:  1. SULFA.  2. STATINS.  3. COUMADIN.   MEDICATIONS:  1. Lasix 20 mg p.o. daily.  2. Coreg 6.25 mg p.o. b.i.d.  3. Uroxatral 10 mg p.o. daily.  4. Hydralazine 12.5 mg p.o. t.i.d.  5. Zyprexa 5 mg p.o. nightly.  6. Zetia 10 mg p.o. daily.  7. Imdur 30 mg p.o. daily.  8. Spiriva 18 mcg 1 inhalation 1 times daily.  9. Aspirin 81 mg p.o. daily.  10.Micronase ?dose.  11.Glyset 50 mg p.o. t.i.d.   PHYSICAL EXAMINATION:  VITAL SIGNS:  Temperature 96.7 degrees  Fahrenheit, BP 101/52, pulse 74, respiration 18, O2 saturation 98% on 2  L by nasal cannula.  GENERAL:  The patient is sleepy but is arousable.  He is alert and  oriented x3, in only mild respiratory distress.  He has frequent  coughing which is intermittently productive with think pale green  phlegm.  HEENT:  Head is normocephalic, atraumatic.  Pupils equal, round, and  reactive to light.  Extraocular muscles are intact.  Nares are patent  without discharge.  Dentition is poor.  Oropharynx without erythema or  exudate.  NECK:  Supple without lymphadenopathy, approximately 8 cm of JVD.  No  thyromegaly.  HEART:  Heart rate is irregularly irregular with audible S1 and S2.  No  clicks, rubs, murmurs, or gallops.  Pulses are absent except for in the  left upper extremity, which were 2+.  LUNGS:  Clear to auscultation except for some very mild rhonchi  bilaterally and decreased breath sounds at the bases.  SKIN:  No rashes, lesions, or petechiae.  ABDOMEN:  Soft, nontender.  It is distended; however, the patient is  morbidly obese.  Normal abdominal bowel sounds.  No rebound or guarding.  Difficult to assess hepatosplenomegaly secondary to the patient's body  habitus.  EXTREMITIES:  No clubbing or cyanosis but 2+ bilateral lower extremity  edema.  MUSCULOSKELETAL:  No joint deformity or effusions.  No spine or CVA  tenderness.  NEUROLOGIC:  Cranial nerves II through XII are grossly intact.  Strength   is 5/5 in all extremities and axial groups.  He has normal sensation and  normal cerebellar function.   RADIOLOGY:  1. The patient had chest x-ray that showed congestive heart failure      with edema and effusions.  2. Echocardiogram completed in March 2009 showing LVEF equal to 40%,      akinesis of the posterolateral wall, moderate-to-severe hypokinesis  of inferoseptal wall.  Right atrium and left atrium are both mild      to moderately dilated, right ventricle shows mild hypertrophy.      There is mild mitral regurgitation.  The inferior vena cava is      mildly dilated with respirophasic IVC changes that are blunted.  3. EKG showing a rhythm of atrial fibrillation at a rate of 64 with      left axis deviation, flat T waves in the inferior leads, no      significant Q waves, QRS is 118 and QTC is 512, and no significant      change from prior EKG, completed on Feb 19, 2009, except for QTC      prolongation.   LABORATORY DATA:  WBC is 7.1 with neutrophils at 86%.  Hemoglobin is  8.7, hematocrit is 25.9, PLT count is 218.  Sodium is 138, potassium is  4.3, chloride 104, CO2 24, BUN 28, creatinine 1.52, glucose 306.  First  set of point-of-care markers were negative.  BNP is 328.  A PTT 30, PT  is 15.2, INR is 1.2.   ASSESSMENT/PLAN:  This is a 77 year old Caucasian male with history  coronary artery disease and congestive heart failure with left  ventricular ejection fraction equal to 40% presents for eval of spell.  The patient discharged recently from [facility] (a couple of  weeks ago) secondary to congestive heart failure.  He was just seen by  Dr. Antoine Poche in his clinic last week.  The patient was unresponsive  today for approximately 2-3 minutes with his eyes open, did not fall  over, back to baseline after this spell.  The patient's wife also  notes that his goiter has been getting worse.  Also, he has been more  more sleepy recently.  However, sleepiness is  significantly different  from spell that occurred today.  1. Spell:  The patient was unresponsive but this is not a clear      syncope ?secondary to transient ischemic attack verses hypoxia with      congestive heart failure ?  We will admit to telemetry, rule out an      myocardial infarction with cycle cardiac enzymes.  We will also      check a CT of the head without contrast.  2. Acute on chronic systolic congestive heart failure:  We will      diurese and monitor creatinine closely.  3. Sleep apnea:  The patient reports wearing a monitor at home.  We      will follow up with this, as well as continue to monitor while      inpatient.  4. Productive cough:  We will start empiric treatment for a community-      acquired pneumonia.      Jarrett Ables, [facility]      Pricilla Riffle, MD, [facility]  Electronically Signed    MS/MEDQ  D:  03/09/2009  T:  03/10/2009  Job:  [id number]

## 2011-02-15 NOTE — Op Note (Signed)
NAMEJANTZ, MAIN                 ACCOUNT NO.:  [id number]   MEDICAL RECORD NO.:  [id number]          PATIENT TYPE:  INP   LOCATION:  IC04                          FACILITY:  [facility]   PHYSICIAN:  Kassie Mends, M.D.      DATE OF BIRTH:  07/14/1928   DATE OF PROCEDURE:  01/18/2008  DATE OF DISCHARGE:                               OPERATIVE REPORT   PRIMARY CARE PHYSICIAN:  Skeet Latch, DO   PROCEDURE:  Esophagogastroduodenoscopy.   INDICATIONS FOR PROCEDURE:  Mr. Homan is a 77 year old male who had a  history of atrial fibrillation, non-ST elevation MI, drug-eluting stent  placement in March 2009.  He has been maintained as an outpatient on  aspirin, Plavix, and Coumadin.  He complains of daily black tarry  stools.  He had a hemoglobin of 10.7 in March 2009, and on admission his  hemoglobin was 7.9.  He was placed on Coumadin in March of 2009, and  during that hospitalization, his INR was never therapeutic but his  hemoglobin remained stable.  On admission here with a hemoglobin of  10.7, his INR was 3.2.  He had 2 units of packed red blood cells with  appropriate blood transfusion response.  His last episode of black stool  was yesterday.   FINDINGS:  1. Patchy erythema with occasional erosion in the antrum, without      evidence of ulcer.  Scant amount of old blood seen in the antrum.  2. A 6 to 8-mm ulcer seen at the junction of D1 and D2 with no red      pigmented spots or exposed central vessel.  Scant amount of old      blood seen in the duodenum but no active bleeding.   DIAGNOSIS:  The duodenal ulcer is the likely source for Mr. Delancey'  melena and drop in his hemoglobin.   RECOMMENDATIONS:  1. He may continue the aspirin and the Plavix because the benefits of      the medications outweigh the risks of bleeding.  The continued use      of aspirin may delay the healing of his duodenal ulcer.  2. Will check an H pylori serology and treat if positive.  3. He should be  on a twice daily PPI.  4. Would monitor his stools closely and his hemoglobin and hematocrit      and transfuse as needed.  5. May advance his diet.  6. Consider colonoscopy in 12-18 months considering he needs to be on      aspirin and Plavix for at least a year.  7. Could consider restarting his Coumadin in 3 weeks.   MEDICATIONS:  Demerol 25 mg IV, Versed 1 mg IV.   DESCRIPTION OF PROCEDURE:  Physical exam was performed.  Informed  consent was obtained from the patient after explaining the risks,  benefits, and alternatives to the procedure.  The patient was connected  to the monitor and placed in the left lateral position.  Continuous  oxygen was provided by nasal cannula, and IV fluids were administered  through an indwelling catheter.  After administration of sedation, the  patient's esophagus was intubated and the scope advanced under direct  visualization to the second portion of the duodenum.  The scope was  removed slowly by carefully examining the color, texture, anatomy, and  texture of the anatomy on the way out.  The patient was recovered at the  bedside.   ADDENDUM:  02/07/08: Spoke with wife. No more melena. H. pylori serology  1.0 (equivocal). Will not treat. OPV [id number]. Could repeat EGD with biopsy  in 6/09.      Kassie Mends, M.D.  Electronically Signed     SM/MEDQ  D:  01/18/2008  T:  01/18/2008  Job:  [id number]   cc:   Madelin Rear. Sherwood Gambler, MD  Fax: [phone]   Rollene Rotunda, MD, [facility]  [address]  [address]  [place]  [place] [address]

## 2011-02-15 NOTE — Consult Note (Signed)
NAMELEMON, STERNBERG                 ACCOUNT NO.:  [id number]   MEDICAL RECORD NO.:  [id number]          PATIENT TYPE:  INP   LOCATION:  IC04                          FACILITY:  [facility]   PHYSICIAN:  R. Roetta Sessions, M.D. DATE OF BIRTH:  Jan 29, 1928   DATE OF CONSULTATION:  DATE OF DISCHARGE:                                 CONSULTATION   HISTORY OF PRESENT ILLNESS:  The patient is a 77 year old Caucasian  gentleman who was admitted to the hospital due to hemoglobin of 6.7  yesterday.  He arrived to the emergency department this morning for  transfusion.  He was placed in ICU for evaluation and transfusion.  It  is notable that the patient was just discharged from [facility]  on Feb 13, 2009 where he was treated for large left pleural effusion and  required chest tube placement.  He has a history of chronic anemia and  looking through his records, I see hemoglobin was in the 8 range during  that hospital stay.  The patient states he has required blood  transfusions in the past and he believes this has been  about 3 months  ago.  He is very somnolent on exam but does awaken and answer some  questions.  He was Hemoccult negative on Feb 02, 2009.  Back in April  2009 he was seen by Dr. Kassie Mends and had 6to 8-mm duodenal ulcer and  a patchy erythema and occasional erosions in the antrum.  He previously  was on Coumadin for chronic atrial fibrillation but this has been  stopped in the past due to his GI bleeds.  He had a remote colonoscopy  over 10 years ago.   The patient denies black stools, bright red  blood per rectum,  constipation, diarrhea, abdominal pain, nausea or vomiting, heartburn,  dysphagia, odynophagia.   MEDICATIONS PRIOR TO ADMISSION:  1. Flomax 0.4 mg daily.  2. Isosorbide mononitrate 30 mg daily.  3. Aspirin 81 mg daily.  4. Plavix 75 mg daily.  5. Coreg 6.25 mg b.i.d.  6. Hydralazine 12.5 mg t.i.d.  7. Pepcid 40 mg b.i.d.  8. Pulmicort inhaler b.i.d.  9.  Lasix 40 mg b.i.d.  10.Xopenex 0.63 q.i.d.  11.Spiriva daily.  12.Zyprexa 5 mg q.h.s.  13.Nu-Iron 150 mg t.i.d. with meals.   ALLERGIES:  SULFA AND STATINS.   PAST MEDICAL HISTORY:  1. Dementia.  2. CHF secondary to ischemic cardiomyopathy.  Most recent EF of 40%  3. History of CAD status post stent.  4. Chronic renal insufficiency.  5. Diabetes mellitus.  6. History of colitis per medical records,  7. Hypertension.  8. Hyperlipidemia.  9. History of anemia.  10.COPD.  11.Peripheral vascular disease.  12.Chronic atrial fibrillation.  13.Urinary retention recently due to psychotropic medications.  14.EGD as outlined above.  15.Prior cholecystectomy.  16.Large left pleural effusion with chest tube as outlined above.   FAMILY HISTORY:  Negative for chronic GI illnesses or colorectal cancer.   SOCIAL HISTORY:  He is married.  He is retired.  He is currently resides  at the [facility].  He has a daughter who is a patient Gaffer  at [employer].  History of cigarette use.  No alcohol use.  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   REVIEW OF SYSTEMS:  See HPI.  CARDIOPULMONARY:  Denies chest pain or  shortness of breath.   PHYSICAL EXAMINATION:  Temperature 97.9, pulse 79, respirations 12,  blood pressure 107/48, O2 saturations 92% on 3 liters nasal cannula.  GENERAL:  Pleasant, somnolent elderly Caucasian gentleman in no acute  distress.  SKIN:  Warm and dry.  No jaundice.  HEENT:  Sclerae nonicteric.  Oropharyngeal mucosa moist and pink.  No  erythema.  No lesions or exudate.  No lymphadenopathy.  CHEST:  Lungs  clear to auscultation and decreased breath sounds left base.  CARDIAC:  Exam reveals regular rate and rhythm.  ABDOMEN:  Positive bowel sounds, soft, nontender, nondistended.  No  organomegaly or masses.  No rebound or guarding.  No abdominal bruits or  hernias.  LOWER EXTREMITIES:  No edema.    LABORATORY DATA:  Sodium 138, potassium 3.6, BUN 44, creatinine 2.2,  glucose 196, LFTs normal except albumin 2.6, white blood cell count  10,400, hemoglobin 6.7, MCV 84.7, platelets 298,000, INR 1.2, folate  15.6, B12 695.  On May 4, iron was 19, sat 6%, TIBC 304.   Acute abdominal series showed minimally improve left-sided aeration and  persistent pleural fluid / thickening and adjacent air space disease,  questionable constipation.   IMPRESSION:  The patient is a 77 year old gentleman with profound  anemia, acute on chronic.  No overt GI bleeding seen.  He was heme  negative a couple weeks ago.  He does have a history of duodenal ulcer 1  year ago.  He is on Plavix and aspirin  along with Pepcid as an  outpatient.  He is on chronic iron therapy.  Would be  concerned about  chronic occult GI bleeding in this setting.   PLAN:  1. Recommend IV PPI.  2. Heme stool.  3. Transfuse as needed.  4. Colonoscopy plus or minus EGD in the next day or so after he has      been transfused.      Tana Coast, P.AJonathon Bellows, M.D.  Electronically Signed    LL/MEDQ  D:  02/18/2009  T:  02/18/2009  Job:  [id number]   cc:   [facility] Team   Madelin Rear. Sherwood Gambler, MD  Fax: [phone]

## 2011-02-15 NOTE — Group Therapy Note (Signed)
NAME:  [name], [name]                 ACCOUNT NO.:  [id number]   MEDICAL RECORD NO.:  [id number]          PATIENT TYPE:  INP   LOCATION:  A316                          FACILITY:  [facility]   PHYSICIAN:  Margaretmary Dys, M.D.DATE OF BIRTH:  [birth year]   DATE OF PROCEDURE:  02/22/2009  DATE OF DISCHARGE:                                 PROGRESS NOTE   SUBJECTIVE:  The patient is sitting up in the edge of the bed and  actually eating breakfast, and the patient looks much better.  Unfortunately, the patient's wife was sick and currently in the hospital  too.  Plan was to have the patient have physical therapy and possibly  discharge home if physical therapy gives a good report, but it looks  like the patient will be in the hospital and sent back to the nursing  home in the next 1 to 2 days.  He feels much better with his Foley  catheter out.   OBJECTIVE:  CONSTITUTIONAL:  Conscious, alert, comfortable, not in acute  distress.  Oriented in time, place, and person.  VITAL SIGNS:  Blood pressure 107/55, pulse 73, respirations 20,  temperature 97.7 degrees Fahrenheit.  Oxygen saturation is 98% on 3L  nasal cannula.  HEENT:  Normocephalic, atraumatic.  Oral mucosa moist with no exudate.  NECK:  Supple.  No JVD.  No lymphadenopathy.  LUNGS:  Clear.  HEART:  S1, S2.  No S3, S4 gallops or rubs.  ABDOMEN:  Soft, nontender.  Bowel sounds positive.  No masses palpable.  EXTREMITIES:  Trace edema bilaterally.  CNS:  Exam was grossly intact.   LABORATORY DATA:  White blood cell count is 7.5, hemoglobin 9.4,  hematocrit 2.8, platelet count was 242,000 with no left shift.  Blood  sugars ranged between 154-260.   ASSESSMENT AND PLAN:  1. Anemia secondary to hematuria.  2. Acute renal failure, completely resolved.  3. Coagulase negative staphylococcus urinary tract infection,      currently on vancomycin.  4. History of mild dementia.   PLAN:  1. Will continue with IV antibiotics with vancomycin  for now.  2. The patient's H and H is stable.  No indication for blood      transfusion.  3. His renal failure has completely resolved.  4. We will continue to await physical therapy evaluation, but I do      think the patient will have to return to the nursing home.  His      wife is currently not in a state to provide any form of care, as      she is also in the hospital.      Margaretmary Dys, M.D.  Electronically Signed     AM/MEDQ  D:  02/22/2009  T:  02/22/2009  Job:  [id number]

## 2011-02-15 NOTE — H&P (Signed)
[name], [name]                 ACCOUNT NO.:  [id number]   MEDICAL RECORD NO.:  [id number]          PATIENT TYPE:  INP   LOCATION:  IC04                          FACILITY:  [facility]   PHYSICIAN:  Dorris Singh, DO    DATE OF BIRTH:  [birth year]   DATE OF ADMISSION:  02/18/2009  DATE OF DISCHARGE:  LH                              HISTORY & PHYSICAL   HISTORY OF PRESENT ILLNESS:  This is a 77 year old Caucasian male who  apparently is a resident at the [facility] who was complaining of some  symptoms and found to have a low hemoglobin of 6.7 yesterday.  He  arrived in the ED early this morning and it was determined that the  patient probably would need to be transfused.  He was typed and cross  matched and it was found that he had antibodies to his blood.  This will  extend his time of actually receiving blood.  At this point in time it  was determined that he would be placed in the ICU to be monitored.  The  patient currently was just discharged from [facility] due to a  large pleural effusion and congestive heart failure with ischemic  cardiomyopathy with ejection fraction of 40.  The patient also has a  history of GI bleed as well and was determined not to be a good  candidate for anticoagulation due to his chronic atrial fibrillation.  He has a known history of chronic anemia.  Upon discharge from [facility] May 11, his hemoglobin was 8.4. In the past it has been in the low  8s and has been as low at 7.6 as well.   PRIMARY CARE PHYSICIAN:  Madelin Rear. Sherwood Gambler, M.D.   CARDIOLOGIST:  Rollene Rotunda, MD, [facility].   ALLERGIES:  SULFA and STATIN.   SOCIAL HISTORY:  He is currently at the [facility].  Normally, prior to  these episodes, he was living at home with his wife.  His daughter is a  patient Gaffer at [employer].  He is retired from [employer]  with a history of smoking one pack a day for 50 years.  He had  previously quit prior to these hospital  admissions this year and  restarted back smoking.   MEDICATIONS:  His discharge medications that he went home on include:  1. Flomax 0.4 mg daily.  2. Isosorbide mononitrate 30 mg daily.  3. Aspirin 81 mg daily.  4. Plavix 75 mg daily.  5. Coreg 6.25 b.i.d.  6. Hydralazine 12.5 t.i.d.  7. Pepcid 40 mg b.i.d.  8. Pulmicort inhaler b.i.d.  9. Furosemide 40 mg b.i.d.  10.Xopenex 0.63 q.i.d.  11.Spiriva daily.  12.Zyprexa 5 mg at bedtime.  13.Nu-Iron 150 mg t.i.d. with meals.   REVIEW OF SYSTEMS:  Pertinent positives are above.  However, there is no  chest pain.  CONSTITUTIONAL:  Negative for changes in appetite.  HEENT:  Eyes negative.  Ears, nose, mouth, throat negative.  HEART:  Negative.  ABDOMEN:  Negative.  GU:  Negative.  MUSCULOSKELETAL:  Positive for  weakness.  HEMATOLOGY:  Positive for chronic anemia.   LABORATORY DATA:  He had an acute abdomen which showed improved left-  sided aeration and persistent pleural thickening and fluid adjacent  __________ space disease.  __________ upright film without convincing  evidence of bowel obstruction or free peritoneal air, question  constipation.   White count 10.4, hemoglobin 6.7, hematocrit 19.4, platelet count  298,000.  Sodium 138, potassium 3.6, chloride 100, CO2 32, glucose 196,  BUN 44, creatinine 2.2.  Urine showed trace blood, trace leukocytes and  few bacteria.  This was on May 19 and he was cross matched for 2 units.   ASSESSMENT/PLAN:  1. Anemia due to acute blood loss.  2. Renal failure.  3. Urinary tract infection.   PLAN:  Admit the patient to the ICU.  Stabilize him to the fact that his  blood is showing positive for antibodies.  We may have to stabilize him  with fluids until we can get blood which may be several hours and he  will need that close monitoring.  Will also have pharmacy dose  antibiotics.  Will have GI see him to assess his anemia and also will  have Dr. Kristian Covey see him as well.  Will do DVT  and GI prophylaxis,  definitely Protonix b.i.d.  Will  continue to monitor him and make any  changes as necessary.      Dorris Singh, DO  Electronically Signed     CB/MEDQ  D:  02/18/2009  T:  02/18/2009  Job:  [id number]

## 2011-02-15 NOTE — Assessment & Plan Note (Signed)
[facility]                            CARDIOLOGY OFFICE NOTE   NAME:BAYNESPeighton, Edgin                        MRN:          [id number]  DATE:07/31/2007                            DOB:          1928/06/07    The primary is Madelin Rear. Sherwood Gambler, MD   REASON FOR PRESENTATION:  Follow up patient status post recent  hospitalization for respiratory failure.   HISTORY OF PRESENT ILLNESS:  The patient presents for follow-up after  being hospitalized for 1 day after worsening oxygenation.  This was felt  to be secondary to some volume as well as exacerbation of his chronic  lung problems.  He did see Dr. Delton Coombes, who is to see him in follow-up.  He was started on diuretics.  He says he is breathing much better.  He  is still avoiding cigarettes.  He is not having any acute problems and  denies any PND or orthopnea.  He has had no palpitations, no presyncope  or syncope.  He has been having some cramping in his feet.  He denies  any chest discomfort, neck or arm discomfort.   PAST MEDICAL HISTORY:  1. Coronary artery disease.  See the June 19, 2007, note for      details.  2. Normal left ventricular function.  3. Hypertension.  4. Diabetes mellitus.  5. Emphysema.  6. Degenerative joint disease.  7. Peripheral vascular disease.  Greater than 80% right carotid      stenosis, 60-79% left carotid stenosis, 0.6 bilateral ankle-      brachial indices.  He refused surgery.   ALLERGIES AND INTOLERANCES:  SULFA.  He has been intolerant of statins.   MEDICATIONS:  1. Klor-Con.  2. Zetia 10 mg daily.  3. Metformin 1000 mg b.i.d.  4. Isosorbide 30 mg b.i.d.  5. Glyburide 5 mg t.i.d.  6. Furosemide 40 mg daily.  7. Spiriva.  8. Coumadin as directed.   REVIEW OF SYSTEMS:  As stated in the HPI and otherwise negative for  other systems.   PHYSICAL EXAMINATION:  The patient is in no distress.  Blood pressure 118/62, heart rate 90 and regular, weight 195  pounds.  Body mass index 28.  HEENT:  Eyelids unremarkable.  Pupils equal, round, and reactive to  light.  Fundi not visualized.  NECK:  No jugular venous distention at 45 degrees.  Carotid upstroke  brisk and symmetric with no bruits, no thyromegaly.  LYMPHATIC:  No adenopathy.  LUNGS:  Decreased breath sounds without wheezing or crackles.  BACK:  No costovertebral angle tenderness.  CHEST:  Unremarkable.  HEART:  PMI not displaced or sustained, S1 and S2 within normal limits.  No S3, no murmurs.  ABDOMEN:  Obese, positive bowel sounds, normal in frequency and pitch.  No bruits, no rebound, no guarding, no midline pulsatile mass or  organomegaly.  SKIN:  No rash, no nodule.  EXTREMITIES:  2+ upper pulses, 2+ femorals, 1+ dorsalis pedis and  posterior tibialis bilaterally.  No edema.  NEUROLOGIC:  Grossly intact.   ASSESSMENT AND PLAN:  1. Shortness of breath.  The patient had multifactorial shortness of      breath with probably a significant component related to edema.  He      is much better on the Lasix.  At this point he will continue this      regimen and I will check a BMET today.  2. Atrial fibrillation.  He is having his Coumadin followed by Dr.      Sherwood Gambler.  He seems to have reasonable rate control, as was seen in      the hospital.  He will continue with the regimen as listed.  3. Coronary artery disease.  He will continue with medical management      and risk reduction.  4. Obesity.  He understands the need to lose weight with diet and      exercise.  5. Follow-up.  I will see him back in 2 months or sooner if needed.     Rollene Rotunda, MD, [facility]  Electronically Signed    JH/MedQ  DD: 07/31/2007  DT: 07/31/2007  Job #: [id number]

## 2011-02-15 NOTE — Op Note (Signed)
[name], [name]                 ACCOUNT NO.:  [id number]   MEDICAL RECORD NO.:  [id number]          PATIENT TYPE:  INP   LOCATION:  4705                         FACILITY:  [facility]   PHYSICIAN:  Duke Salvia, MD, FACCDATE OF BIRTH:  [birth year]   DATE OF PROCEDURE:  03/11/2009  DATE OF DISCHARGE:                               OPERATIVE REPORT   PREOPERATIVE DIAGNOSIS:  Intermittent high-grade heart block with pauses  of greater than 8 seconds and loss of consciousness.   POSTOPERATIVE DIAGNOSIS:  Intermittent high-grade heart block with  pauses of greater than 8 seconds and loss of consciousness, permanent  atrial fibrillation.   PROCEDURE:  Single chamber pacemaker implantation.   Following obtaining an informed consent, the patient was brought to the  electrophysiology laboratory and placed on the fluoroscopic table in  supine position.  After routine prep and drape of the left upper chest,  lidocaine was infiltrated in prepectoral subclavicular region.  Incision  was made and carried down to layer of the prepectoral fascia.  Using  electrocautery and sharp dissection, a pocket was formed and similarly  hemostasis was obtained.   Thereafter, attention was turned gaining access to extrathoracic left  subclavian vein, which was accomplished without difficulty without the  aspiration of air or puncture of the artery.  A single venipuncture was  accomplished, a 7-French sheath was placed and through this was passed a  St. Jude 28 ATC active fixation ventricular leads over BET [id number], and  under fluoroscopic guidance it was nicked to the right ventricular apex  where the bipolar R-wave was 9.7, with a pacing impedance of 704 with a  threshold of 0.9 volts at 0.5 milliseconds.  Current threshold was 1.3  mA.  Diaphragmatic pacing was absent to 10 volts and the current of  injury was brisk.  This lead was secured to the prepectoral fascia and  attached to a Zephyr pulse generator  model [id number], serial number [id number].  Occasional ventricular pacing was identified.  The lead and the pulse  generator were placed in the pocket and secured to the prepectoral  fascia.  The pocket having been copiously irrigated with antibiotic  containing saline solution, the device was attached to the prepectoral  fascia.  The wound was closed in 3 layers with normal fashion.  The  wound was washed, dried, and a benzoin Steri-Strip dressing was applied.  Needle counts, sponge counts, and instrument counts were correct at the  end of the procedure according to the staff.  The patient tolerated the  procedure without apparent complication.      Duke Salvia, MD, [facility]  Electronically Signed     SCK/MEDQ  D:  03/11/2009  T:  03/12/2009  Job:  [id number]

## 2011-02-15 NOTE — Group Therapy Note (Signed)
[name], [name]                 ACCOUNT NO.:  [id number]   MEDICAL RECORD NO.:  [id number]          PATIENT TYPE:  INP   LOCATION:  IC10                          FACILITY:  [facility]   PHYSICIAN:  Skeet Latch, DO    DATE OF BIRTH:  November 13, 1927   DATE OF PROCEDURE:  12/21/2007  DATE OF DISCHARGE:                                 PROGRESS NOTE   SUBJECTIVE:  [name] states that he is feeling better at this moment.  The patient's shortness of breath is improving.  I am encouraging the  patient to get up out of bed and up in his chair.  The patient denies  any chest pain and states that he is stable at this time.   OBJECTIVE:  VITAL SIGNS:  Heart rate is 94, respiratory rate 25, blood  pressure is 121/59.  CARDIOVASCULAR:  Distant heart sounds.  S1 and S2  are within normal limits.  No clicks or murmurs are noted.  LUNGS:  Decreased.  No rhonchi or wheezing are noted.  ABDOMEN:  Obese, soft,  nontender and non-distended.  Positive bowel sounds.  EXTREMITIES:  He  has a trace edema to +1 in his lower extremities at this time, no  erythema.  NEUROLOGIC:  He is oriented to time and place.  Cranial  nerves II-XII are grossly intact.   His EKG showed sinus tachycardia with second-degree AV block with  occasional PVCs.   Chest x-ray showed a mild to moderate CHF.   ASSESSMENT AND PLAN:  1. Acute exacerbation of congestive heart failure.  The patient seems      to be improving.  He had a repeat BNP in the morning.  The patient      is on O2 to keep his saturations above 92%.  He had daily weights      and the patient is on intravenous Lasix at this time.  2. For his diabetes, hypertension and hyperlipidemia, the patient will      be placed on his home medications.  The patient will be on a      sliding scale with his blood sugars checked before meals and at      bedtime.  3. For his recent cataract surgery, the patient will be placed on his      home eye drops at this time  also.      Skeet Latch, DO  Electronically Signed     SM/MEDQ  D:  12/21/2007  T:  12/21/2007  Job:  [id number]

## 2011-02-15 NOTE — Assessment & Plan Note (Signed)
[facility]                            CARDIOLOGY OFFICE NOTE   [name], [name]                        MRN:          [id number]  DATE:01/17/2008                            DOB:          06-07-1928    PRIMARY CARE PHYSICIAN:  Madelin Rear. Sherwood Gambler, M.D.   REASON FOR PRESENTATION:  Followup of the patient with recent  hospitalization for non-Q-wave myocardial infarction.   HISTORY OF PRESENT ILLNESS:  [name] was admitted on December 25, 2007  to [facility] on transfer from [facility].  He had been admitted  with shortness of breath and flash pulmonary edema.  He did rule in with  a non-Q-wave myocardial infarction.  His cardiac catheterization  demonstrated the LAD had 30%-40% stenosis, the first diagonal was  moderate-sized, with diffuse nonobstructive disease.  The circumflex had  30%-40% stenosis, there was a large branching ramus intermedius with 95%  stenosis before a bifurcating lesion, the right coronary artery was  dominant.  A long proximal 90% stenosis.  There was mid 95% stenosis.  There was 50% stenosis before the PDA.  The patient subsequently  underwent stenting with a Promus drug-eluting stent to the proximal  right coronary artery.  The circumflex was likewise treated with a drug-  eluting Promus stent as well.  He was sent home on low-dose aspirin,  Plavix, and Coumadin.   Since going home, the patient says he has had significant limitations  with back pain and leg pain.  He has not been able to ambulate without  difficulty.  He feels weak and washed out.  He said he has noticed  some black stools.  He has not had any red blood.  He has had no  presyncope or syncope.  He has not had any of the shortness of breath  that he was having.  He is not describing PND or orthopnea.  He has not  been having chest discomfort.  He has noted that his blood sugar has  been markedly elevated, and he is being treated by Dr. Sherwood Gambler for this.   PAST MEDICAL HISTORY:  1. Coronary artery disease, as described above.  2. Atrial fibrillation.  3. Diabetes mellitus.  4. Hypertension.  5. Hyperlipidemia.  6. Tobacco abuse (the patient quit at the time of his most recent      admission).  7. Obesity.  8. Peptic ulcer disease.  9. Pancreatitis.  10.Degenerative joint disease.  11.Peripheral vascular disease.  12.Allergic rhinitis.  13.Cholecystectomy.   ALLERGIES:  1. SULFA.  2. STATIN.   MEDICATIONS:  1. Klor-Con 20 mEq daily.  2. Isosorbide 30 mg b.i.d.  3. Glyburide 5 mg t.i.d.  4. Furosemide 40 mg daily.  5. Spiriva.  6. Coumadin per Dr. Sherwood Gambler.  7. Azimilide 10 mg daily.  8. Aspirin 81 mg daily.  9. Plavix 75 mg daily.  10.Carvedilol 3.125 mg b.i.d.  11.Micronase 5 mg b.i.d.   REVIEW OF SYSTEMS:  As stated in the HPI, and otherwise negative for  other systems.   PHYSICAL EXAMINATION:  GENERAL:  The patient is in no  distress.  VITAL SIGNS:  Blood pressure 96/54, heart rate 75 and regular, weight  207 pounds, body mass index 31.  HEENT:  Eyelids unremarkable.  Pupils equal, round, and reactive to  light.  Fundi not visualized.  Oral mucosa unremarkable.  NECK:  No jugular venous distention at 45 degrees, carotid upstrokes  brisk and symmetric, no bruits, no thyromegaly.  LYMPHATICS:  No cervical, axillary, or inguinal adenopathy.  LUNGS:  Clear to auscultation bilaterally.  BACK:  No costovertebral angle tenderness.  CHEST:  Unremarkable.  HEART:  PMI not displaced or sustained.  S1 and S2 within normal limits,  no S3, no murmurs.  ABDOMEN:  Obese, positive bowel sounds, normal in frequency and pitch.  No bruits, no rebound, no guarding, no  midline pulsatile mass, no  hepatomegaly, no splenomegaly.  SKIN:  No rashes, no nodules.  EXTREMITIES:  2+ pulses.  No edema, no cyanosis, no clubbing,.  RECTAL:  Stool guaiac positive.  NEUROLOGIC:  Oriented to person, place, and time.  Cranial nerves II-XII  grossly  intact.  Motor grossly intact.   EKG:  Atrial fibrillation, left axis deviation, left anterior fascicular  block, poor anterior R-wave progression.   ASSESSMENT AND PLAN:  1. Guaiac positive stool.  The patient presented with this complaint,      and he was guaiac positive, with dark brown stool, not black in the      office.  I did draw some blood and send him home.  However, he does      have a hemoglobin that now comes back a few hours later at a little      bit greater than 8.  He has been called to present to the [facility], where he will be admitted.  I spoke with Dr.      Lilian Kapur of the [facility], who will admit him.  This is a      difficult situation.  He clearly needs to come off of his Coumadin.      However, with drug-eluting stents, he needs to be on Plavix, and I      would prefer a low-dose aspirin, unless he has a GI evaluation that      demonstrates an absolute contraindication.  2. Coronary disease, as above.  3. Diabetes.  His sugar today on lab work done in the office was      greater than 600, and this will be addressed during his admission.  4. Hyponatremia.  This is probable related to the blood sugar of 698.      This will be followed.  5. Renal insufficiency.  His BUN is 55, and creatinine was 2.  This is      slightly off from his baseline and may present intravascular volume      depletion related to his uncontrolled diabetes, as well as his      medications.  6. Followup.  The patient again will be hospitalized by the [facility]      hospital service.  I did speak with Dr. Sherwood Gambler in the office.  Dr.      Sherwood Gambler directed that he be admitted, and again I spoke with the      hospitalists, and they are happy to see him.  I called [name]      at home, and he will present promptly to the emergency room for      further evaluation and treatment.  Rollene Rotunda, MD, [facility]  Electronically Signed    JH/MedQ  DD: 01/17/2008  DT:  01/17/2008  Job #: [id number]   cc:   Madelin Rear. Sherwood Gambler, MD

## 2011-02-15 NOTE — Group Therapy Note (Signed)
NAMEALONZA, KNISLEY                 ACCOUNT NO.:  [id number]   MEDICAL RECORD NO.:  [id number]          PATIENT TYPE:  INP   LOCATION:  A316                          FACILITY:  [facility]   PHYSICIAN:  Margaretmary Dys, M.D.DATE OF BIRTH:  [birth year]   DATE OF PROCEDURE:  02/21/2009  DATE OF DISCHARGE:                                 PROGRESS NOTE   SUBJECTIVE:  The patient feels better today.  The patient does not want  this catheter.  He wants the catheter removed.  He says he feels very  uncomfortable and is having spasms with the catheter.  His urine  cultures have come back positive with Staphylococcus, coagulase  negative.   PHYSICAL EXAMINATION:  GENERAL:  The patient was conscious and alert.  Was well-oriented in time, place and person. Was very pleasant, although  intermittently somewhat confused.  VITAL SIGNS:  Blood pressure was 112/68 with respiratory rate of 20,  temperature was 98.5 degrees Fahrenheit, oxygen saturation is 94% on  room air.  HEENT:  Normocephalic, atraumatic.  Oral mucosa was dry.  No exudates  were noted.  NECK:  Supple.  No JVD or lymphadenopathy.  LUNGS:  Reduced air entry bilaterally with no crackles, wheezing or  rhonchi.  HEART:  S1-S2, no excessive gallops or rubs.  ABDOMEN:  soft, nontender.  Bowel sounds positive.  EXTREMITIES:  No edema.  No calf induration or tenderness was noted.  NEUROLOGIC:  Awake, alert following commands with no focal neurological  deficits.   LABORATORY DATA:  White blood count 8.2, hemoglobin of 9.7, hematocrit  of 28, platelet count was 250,000 with no left shift.  Sodium is 141,  potassium is 3.7, chloride of 103, CO2 was 34, glucose is 90, BUN of 24,  creatinine was 1.6, calcium was 80.6.   ASSESSMENT/PLAN:  1. Anemia, likely of chronic disease and also from progressive      hematuria. The patient's urine has cleared up on Foley catheter. No      other source of bleeding has been observed since being admitted.  2. Acute on chronic renal failure.  This has resolved.  Patient is      currently on IV fluid. Will taper his IV fluid off and will      encourage his oral intake.  3. Staphylococcus species urinary tract infection with coagulase      negative staph urinary tract infection.  The patient will be      started on vancomycin IV.   DISPOSITION:  The patient's family really want him to come back home if  it is possible. We will obtain a PT/OT evaluation to see if the patient  can actually be able to be discharged home.  If the patient is able to  go home then we can discharge him. If not he will probably to home on  Monday before he goes to the nursing home.   Resume all his other medications.   Will transfer him to the medical floor, 3-A.      Margaretmary Dys, M.D.  Electronically Signed     AM/MEDQ  D:  02/21/2009  T:  02/21/2009  Job:  [id number]

## 2011-02-15 NOTE — Assessment & Plan Note (Signed)
HEALTHCARE                            CARDIOLOGY OFFICE NOTE   NIGUEL, MOURE                        MRN:          [id number]  DATE:09/18/2007                            DOB:          1928/06/19    PRIMARY:  Dr. Artis Delay.   REASON FOR PRESENTATION:  Evaluate the patient with coronary artery  disease and atrial fibrillation.   HISTORY OF PRESENT ILLNESS:  The patient is 77 years old.  He presents  for followup and has been doing relatively well since I last saw him.  He says he has been sleeping better.  He has been breathing better.  He  has seen Dr. Delton Coombes.  He is not having any acute shortness of breath.  Denies any PND or orthopnea.  Has not been having any chest discomfort,  neck or arm discomfort.  He has not noticed any palpitations, pre-  syncope, or syncope.   PAST MEDICAL HISTORY:  Coronary artery disease (see the September 2008  note for details).  Normal left ventricular function.  Hypertension.  Diabetes mellitus.  Emphysema.  Degenerative joint disease and  peripheral vascular disease (greater than 80% right carotid stenosis, 60-  79% left carotid stenosis, 0.6 bilateral ankle brachial indexes, surgery  felt to be high risk).   ALLERGIES/DRUG INTOLERANCES:  SULFA.  He has been intolerant to all  STATINS.   MEDICATIONS:  1. Klor-Con 20 mEq daily.  2. Zetia 10 mg daily.  3. Metformin 1000 mg b.i.d.  4. Isosorbide 30 mg b.i.d.  5. Glyburide 5 mg t.i.d.  6. Furosemide 40 mg daily.  7. Spiriva.  8. Coumadin.   REVIEW OF SYSTEMS:  As stated in the HPI and otherwise negative for  other systems.   PHYSICAL EXAMINATION:  The patient is in no distress.  Blood pressure 160/68, heart rate 84 and regular, weight 209 pounds.  Body mass index 30.  HEENT:  Eyelids unremarkable.  Pupils are equal, round, and reactive to  light and accommodation.  Fundi are not visualized.  Oral mucosa  unremarkable.  NECK:  No jugular venous  distension at 45 degrees, carotid upstroke  brisk and symmetric, no thyromegaly.  LYMPHATICS:  No adenopathy.  LUNGS:  Clear to auscultation bilaterally.  BACK:  No costovertebral angle tenderness.  CHEST:  Unremarkable.  HEART:  PMI not displaced or sustained, S1 and S2 within normal limits,  no S3, no S4, no clicks, rubs, murmurs.  ABDOMEN:  Obese, positive bowel sounds, normal in frequency and pitch,  no bruits, rebound, guarding.  No midline pulsatile masse, hepatomegaly,  splenomegaly.  SKIN:  No rashes, no nodules.  EXTREMITIES:  With 2+ upper pulses, 2+ femorals, 1+ dorsalis pedis and  posterior tibialis bilaterally.  Mild bilateral lower extremity edema.  NEURO:  Oriented to person, place, and time, cranial nerves 2-12 grossly  intact, motor grossly intact.   EKG shows atrial fibrillation, left axis deviation.  Left anterior  fascicular block.  Poor anterior R wave progression.   ASSESSMENT AND PLAN:  1. Atrial fibrillation.  The patient seems to tolerate this rhythm.  Does not really notice it.  He seems to have reasonable rate      control.  He is having some trouble with the Coumadin, but he will      remain on it though he does not like it.  2. Coronary artery disease.  He is being managed with risk reduction.      He is not on aspirin because he is on the Coumadin.  3. Peripheral vascular disease.  The patient reminds me that we      thought he was high risk for carotid surgery, though part of it was      he did not want it.  Will continue to try risk reduction.  4. Tobacco.  He says he is still abstinent from smoking.  5. Dyslipidemia.  He will not take Statins, but he does agree to      [employer].  6. Followup.  I will see him back in about 4 months or sooner if      needed.     Rollene Rotunda, MD, [facility]  Electronically Signed    JH/MedQ  DD: 09/18/2007  DT: 09/18/2007  Job #: [id number]   cc:   Madelin Rear. Sherwood Gambler, MD

## 2011-02-15 NOTE — Assessment & Plan Note (Signed)
[facility]                            CARDIOLOGY OFFICE NOTE   FREDERICK, MARRO                        MRN:          [id number]  DATE:03/04/2009                            DOB:          Feb 20, 1928    PRIMARY CARE PHYSICIAN:  Madelin Rear. Sherwood Gambler, MD   REASON FOR PRESENTATION:  Evaluate the patient with dyspnea.   HISTORY OF PRESENT ILLNESS:  The patient presents after a long  hospitalization at [facility].  He presented with dyspnea and was found  to have a large pleural effusion.  This look like a transudative  effusion requiring chest tube drainage.  He had compressive atelectasis  and ever did fully expand his left lung.  He actually required  intubation for a while.  He was anemic during that hospitalization.  This has been a chronic problem.  He was quite confused and delirious,  which was a significant difficulty in getting him rehabbed and  discharged.  He subsequently was sent to rehab facility.  He said he was  there for a few days and his wife reports that his hemoglobin dropped to  6.  He was rehospitalized at [facility] requiring 3 units of blood  transfusion.  I do not have any of those records, although, I did look  at the labs and reviewed the x-rays.  He was sent home from [facility]  to his home where he has home health nursing.  He is getting around in  his house.  His breathing is better than it was prior to his  hospitalization, but not at his baseline from 6 to 12 months ago.  He  does have increased lower extremity swelling, but he sits with his feet  down quite a bit of the day.  He is not having any new chest discomfort,  neck or arm discomfort.  He is not noticing any palpitations,  presyncope, or syncope.  His wife states his confusion is cleared, but  not completely.  He is watching his salt.  He has had his labs followed  closely by Dr. Sherwood Gambler.  He has chronic renal insufficiency and he has  anemia related to chronic GI  bleeding.   PAST MEDICAL HISTORY:  Coronary artery disease (Last catheterization in  2009 demonstrated 3-vessel CAD.  He underwent stenting of her right  coronary artery with a PROMUS drug-eluting stent.  He also has stenting  of his circumflex with drug-eluting stents previously), mild-to-  moderately reduced ejection fraction (40%), COPD, renal insufficiency,  anemia from chronic GI bleed, type 2 diabetes mellitus, hypertension,  hyperlipidemia, atrial fibrillation (intolerant to Coumadin), peptic  ulcer disease, peripheral vascular disease, cholecystitis with  cholecystectomy.   ALLERGIES/INTOLERANCES:  SULFA.  He has also been intolerant of STATINS.   MEDICATIONS:  1. Lasix 20 mg daily.  2. Coreg 6.25 mg b.i.d.  3. Uroxatral 10 mg daily.  4. Hydralazine 12.5 mg b.i.d.  5. Iron 150 mg daily.  6. Zyprexa 5 mg at bedtime.  7. Zetia 10 mg daily.  8. Isosorbide 30 mg b.i.d.  9. Spiriva.  10.Aspirin  81 mg daily.  11.Micronase.  12.Glyset 50 mg t.i.d.   REVIEW OF SYSTEMS:  Positive for insomnia, otherwise as stated in the  HPI, negative for other systems.   PHYSICAL EXAMINATION:  GENERAL:  The patient is unable to weigh.  VITAL SIGNS:  Blood pressure 126/60, heart rate 84 and regular, oxygen  saturation 99% on room air.  HEENT:  Eyelids unremarkable; pupils are equal, round, and reactive to  light; fundi not visualized; oral mucosa unremarkable.  NECK:  No jugular venous distention at 90 degrees, carotid upstroke  brisk and symmetric, no bruits, no thyromegaly.  LYMPHATICS:  No adenopathy.  LUNGS:  Decreased breath sounds without wheezing or crackles.  BACK:  No costovertebral angle tenderness.  HEART:  PMI not displaced or sustained; S1 and S2 within normal limits,  no S3, distant heart sounds, no obvious murmurs.  ABDOMEN:  Obese; positive bowel sounds, normal in frequency and pitch;  no bruits, no rebound, no guarding, no midline pulsatile mass; no  organomegaly.  SKIN:   No rashes, no nodules.  EXTREMITIES:  Upper pulses 2+, absent dorsalis pedis and posterior  tibialis, moderate-to-severe bilateral lower extremity edema to above  the knees.  NEURO:  Oriented to person, place, and time; cranial nerves grossly  intact; motor grossly intact.   EKG:  Atrial fibrillation, left axis deviation, poor anterior R-wave  progression, no acute ST-T wave changes.   ASSESSMENT AND PLAN:  1. Dyspnea.  The patient has multifactorial dyspnea.  It seems to be      at baseline.  His chest x-ray that I reviewed does not appear to      demonstrate severe increase in his effusion.  We will continue to      manage this with diuretics.  He does have apparent followup with      pulmonologist in the weeks ahead.  2. Edema.  The patient has progressive lower extremity edema.  He is      keeping his feet down.  Compounding this is his renal      insufficiency.  We need to move cautiously with his diuretics, but      I will increase him to 40 mg of Lasix daily.  He has a home health      nurse and we will give her written instructions to get a BMET in 1      week.  The next step will most likely be to switch him to      torsemide.  He understands salt and fluid restriction.  He      understands the need to keep his feet elevated.  3. Coronary artery disease.  He is having no ongoing angina.  We are      going to manage this conservatively.  4. Mildly reduced ejection fraction, probably he has a combination of      systolic and diastolic heart failure.  He is on a reasonable      regimen and I will continue this.  5. Hypertension.  Blood pressure is reasonably controlled.  He will      continue with medical regimen as listed.  6. Insomnia.  The Zyprexa does not seem to be helping this issue.  He      was put on this in the hospital for his delirium.  I think it is      okay to discontinue this medication and restart amitriptyline 50 mg      at bedtime, which helped in the  past.  7. Followup.  I will see him back in 4 weeks or sooner if needed.     Rollene Rotunda, MD, [facility]  Electronically Signed    JH/MedQ  DD: 03/04/2009  DT: 03/05/2009  Job #: [id number]   cc:   Madelin Rear. Sherwood Gambler, MD

## 2011-02-15 NOTE — Assessment & Plan Note (Signed)
[name], [name]                  CHART#:  [id number]   DATE:  02/27/2008                       DOB:  Aug 31, 1928   REFERRING PHYSICIAN:  Madelin Rear. Sherwood Gambler, M.D.   PRIMARY CARDIOLOGIST:  Rollene Rotunda, MD, F.A.C.C.   PROBLEM LIST:  1. Melena while taking aspirin, Plavix and Coumadin in April of 2009.  2. Atrial fibrillation.  3. Non-ST-elevation MI with drug eluting stent placement in March of      2009.  4. Chronic diarrhea.  5. Laparoscopic cholecystectomy in December of 2003.  6. Diabetes for 23 years.  7. Hypertension.  8. Hyperlipidemia.  9. ALLERGY TO SULFA.   SUBJECTIVE:  [name] is a 77 year old male, who presents as a return  patient visit.  He was initially seen as an inpatient consultation in  April of 2009.  He had a GI bleed, secondary to duodenal ulcer, while in  aspirin, Plavix and Coumadin.  He also needs a colonoscopy after he may  be off at least aspirin.  He continues on aspirin and Plavix and his  Coumadin has not been restarted.  He bumped his arm and has a spot that  is bleeding on his right wrist.  He complains of brown, loose, watery  stool off and on.  He has had these symptoms for quite a few years.  When he has loose stool, he has a difficult time controlling his bowel  movements.  He does consume a lot of milk.  As a matter of fact, he had  a big glass of chocolate milk and cookies.  He has tried to add some  fiber to his diet, using oatmeal bread.  He denies any black, tarry  stool.   MEDICATIONS:  1. Lasix  30 mg daily.  2. Iron 325 mg daily.  3. Coreg 3.125 mg b.i.d.  4. Glyset 50 mg t.i.d.  5. Multivitamin.  6. Plavix 75 mg daily.  7. Omeprazole 20 mg twice daily.  8. Micronase 5 mg twice daily.  9. Imdur 30 mg b.i.d.  10.Aspirin 81 mg daily.  11.Actos 30 mg daily.  12.Zetia 10 mg q.h.s.  13.Potassium chloride 20 mEq q.h.s.  14.Temazepam 30 mg as needed for sleep.  15.He just finished a prednisone taper.   OBJECTIVE:   PHYSICAL EXAM:  Weight 204 pounds (unchanged since 2003), height 5 feet  9, BMI 30.1 (slightly obese), temperature 98.3, blood pressure 110/64,  pulse 72.  GENERAL:  He is in no apparent distress, alert and oriented times  four.LUNGS:  Clear to auscultation bilaterally.CARDIOVASCULAR EXAM:  Irregular rhythm, unable to appreciate a murmur.ABDOMEN:  Bowel sounds  are present, soft, mildly obese, nontender, nondistended.  NEUROLOGIC:  He walks with a shuffling gait.   ASSESSMENT:  [name] is a 77 year old male, who had a GI bleed  secondary to a duodenal ulcer, which is likely NSAID-related.  He has no  recurrence of his symptoms.  He complains of chronic diarrhea.  The  differential diagnosis for his chronic diarrhea includes lactose  intolerance, diabetic neuropathy, bile salt induced diarrhea.   Thank you for allowing me to see [name] in consultation.  My  recommendations follow.   RECOMMENDATIONS:  1. [name] is given his discharge instructions on the management of      his diarrhea in writing.  He is asked to use [employer]      for Lactose Intolerance daily or as needed when consuming dairy      products.  He may also use Lactaid in addition to that.  He has      already tried soy milk and does not like that.  2. He is asked to take Benefiber daily to bulk up his stools.  He is      also asked to add fiber to his diet.  He was given a hand-out on a      high-fiber diet.  I warned him that fiber can cause bloating and      gas, so if he finds certain items do cause bloating and gas, then      he should avoid them.  3. He may use Imodium as needed to slow down the diarrhea/loose      stools.  4. He has a follow up appointment to see me in four months.  I asked      his wife to call me if Dr. Antoine Poche decides to restart his      Coumadin.  5. He may reduce his omeprazole to once daily.  He should continue      this indefinitely while on aspirin.       Kassie Mends, M.D.  Electronically Signed     SM/MEDQ  D:  02/27/2008  T:  02/27/2008  Job:  [id number]   cc:   Madelin Rear. Sherwood Gambler, MD  Rollene Rotunda, MD, [facility]

## 2011-02-18 NOTE — Assessment & Plan Note (Signed)
[facility]                              CARDIOLOGY OFFICE NOTE   ECTOR, LAUREL                        MRN:          [id number]  DATE:06/19/2006                            DOB:          Apr 29, 1928    PRIMARY:  Dr. Artis Delay.   REASON FOR PRESENTATION:  Evaluate patient with coronary artery disease.   HISTORY OF PRESENT ILLNESS:  The patient is a 77 year old gentleman who  returns for followup of the above.  He has had no new symptoms since I last  saw him.  He is at baseline with dyspnea with moderate exertion.  He has had  no resting shortness of breath, PND or orthopnea.  He has had no chest  discomfort.   PAST MEDICAL HISTORY:  1. Coronary artery disease (see the September 02, 2004 note for details,      describing normal left ventricular function.)  2. Hypertension.  3. Diabetes.  4. Emphysema.  5. Degenerative joint disease.  6. Peripheral vascular disease (80% right carotid stenosis and 60% to 79%      left carotid stenosis, 0.6 bilateral ankle-brachial indices).   ALLERGIES:  The patient has been intolerant to STATINS.   MEDICATIONS:  1. Glucophage 1000 mg b.i.d.  2. Aspirin 81 mg daily.  3. Imdur 30 mg daily.  4. Zetia 10 mg daily.  5. K-Dur 20 mEq daily.  6. Lasix 40 mg daily.  7. Altace 5 mg daily.  8. Actos 15 mg daily.  9. Glyburide 5 mg t.i.d.   REVIEW OF SYSTEMS:  As stated in the HPI and otherwise negative for other  systems.   PHYSICAL EXAMINATION:  GENERAL:  The patient is in no distress.  VITAL SIGNS:  Blood pressure 132/58, heart rate 94 and regular.  NECK:  No jugular venous distention at 45 degrees.  Carotid upstroke brisk  and symmetric, no bruits.  No thyromegaly.  LYMPHATICS:  No adenopathy.  LUNGS:  Clear to auscultation bilaterally.  BACK:  No costovertebral angle tenderness.  HEART:  S1 and S2 within normal limits, no S3, no S4, no murmurs.  ABDOMEN:  Flat.  Positive bowel sounds, normal in  frequency and pitch.  No  bruits, no rebound, no guarding, no midline pulsatile mass, positive midline  ventral hernia.   EXTREMITIES:  Upper pulses 2+, absent dorsalis pedis and posterior tibialis  bilaterally.   EKG:  Sinus rhythm, rate 94, left anterior fascicular block, RSR prime in V1  and V2, with an incomplete right bundle branch block, no acute ST-T wave  changes, mild Q-T prolongation.   ASSESSMENT AND PLAN:  1. Coronary artery disease:  The patient has coronary disease, as      previously described.  He is not having any symptoms.  He wants      conservative management.  He is not particularly participating in risk      reduction, but he is not having any acute symptoms at this point.      Given his resistance to lifestyle changes, we will manage acute      problems  when they arise and continue to treat him with medications as      listed.  2. Followup:  I will see him back yearly or as needed.  He will have his      lipids followed by Dr. Sherwood Gambler, though he has not been tolerant of any      statins.  He, by the way, continues to smoke and has no desire to quit.                               Rollene Rotunda, MD, [facility]    JH/MedQ  DD:  06/19/2006  DT:  06/20/2006  Job #:  [id number]   cc:   Madelin Rear. Sherwood Gambler, MD

## 2011-02-18 NOTE — Consult Note (Signed)
NAME:  [name], [name]                           ACCOUNT NO.:  [id number]   MEDICAL RECORD NO.:  [id number]                   PATIENT TYPE:  INP   LOCATION:  IC04                                 FACILITY:  [facility]   PHYSICIAN:  Vida Roller, M.D.                DATE OF BIRTH:  08/29/1928   DATE OF CONSULTATION:  06/10/2004  DATE OF DISCHARGE:                                   CONSULTATION   PRIMARY CARE PHYSICIAN:  Dr. Madelin Rear. Fusco.   HISTORY OF PRESENT ILLNESS:  [name] is a 77 year old man who has  diabetes, hypertension and chronic obstructive pulmonary disease who was  brought to [facility] with progressive dyspnea.  He was found to be  in acute respiratory distress and was subsequently intubated.  His blood  pressure was elevated on arrival at 270/145.  He was treated with IV Lasix  and Solu-Medrol, IV Lopressor and IV Lasix.  He appears to have had  significant diuresis.  He is intubated currently in the ICU, but is alert  and denies any chest discomfort.  He denies any discomfort in his chest  prior to this evaluation.   PAST MEDICAL HISTORY:  Significant for diabetes mellitus, hypertension,  history of peptic ulcer disease, history of pancreatitis and chronic  obstructive pulmonary disease and DJD.   MEDICATIONS PRIOR TO ADMISSION:  1.  Actos 15 mg a day.  2.  Glyburide 5 mg three times a day.  3.  Verapamil 240 mg a day.  4.  Glucophage 500 mg a day.  5.  Altace 5 mg a day.   SOCIAL HISTORY:  He lives in [place] with his wife.  He is retired. A  service member.  He has been married.  He has a 50+ pack a year smoking  history.  He does not drink or use any illicit drugs.   FAMILY HISTORY:  Unobtainable due to his intubated state as was his review  of systems.  Here in the hospital, he is on albuterol nebulizations, Lovenox  90 mg q.12, Lasix 40 mg IV q.12, sliding-scale insulin, Levaquin 750 mg  daily, and normal saline.   PHYSICAL EXAMINATION:   GENERAL:  He is a well-developed, well-nourished  white male, in no apparent distress.  He is alert and oriented x4.  VITAL SIGNS:  Blood pressure 107/83.  Temperature 98.2, pulse 98,  respiratory rate is ventilated at 100%.  HEENT:  Examination of the head, ears, eyes, nose and throat is  unremarkable.  He is normocephalic, atraumatic.  NECK:  Supple.  He has no jugular venous distension.  He does have a regular  rate with a 2/6 holosystolic murmur.  He has no diastolic murmur.  LUNGS:  Coarse breath sounds from the ventilator, but otherwise no rales.  ABDOMEN:  Soft, nontender.  EXTREMITIES:  Lower extremities, he has trace edema bilaterally.   LABORATORY DATA/STUDIES:  Chest CT shows no pulmonary edema.  He does have  bilateral pleural effusions with some interstitial edema.   His echocardiogram is a poor study, but shows normal left ventricular  systolic function.  Electrocardiogram shows sinus tachycardia at a rate of  116 with left axis, but no ischemic ST-T wave changes.  He does have a  complete right bundle branch block, and a left anterior fascicular block.  His QRS duration is 111 msec.   White blood cell count 19.  H&H of 15 and 45 with a platelet count of  280,000.  Sodium 138, potassium 3.8. chloride 105, bicarbonate 25, BUN 10,  creatinine 0.9.  His blood sugar is 247.  Liver function studies are all  within normal limits.  His initial point of care enzymes are negative.  His  initial brain type natriuretic peptide is 163.   This is a gentleman with ventilator-dependent respiratory failure, looks  like in the setting of hypertensive crisis with congestive heart failure  likely from just severe afterload.  He does have normal left ventricular  function on echocardiogram and does not have a history of coronary disease,  although it is not completely out of the picture for this to potentially be  flash pulmonary edema from potential coronary artery disease, although he   denies any chest discomfort.   RECOMMENDATIONS:  My recommendations are we cycle his enzymes, recheck his B-  type natriuretic peptide tomorrow and once he is extubated, we will get a  better idea of the history around this.  It might be reasonable to consider  an ischemic workup either with a catheterization or a Cardiolite depending  on what the history is.  At this point, he appears to be much improved.  I  think that some attention needs to be paid to his hypertensive medical  regimen and he may benefit from consideration for the addition of aspirin as  well.      ___________________________________________                                            Vida Roller, M.D.   JH/MEDQ  D:  06/10/2004  T:  06/10/2004  Job:  [id number]

## 2011-02-18 NOTE — Op Note (Signed)
NAME:  [name], [name]                           ACCOUNT NO.:  [id number]   MEDICAL RECORD NO.:  [id number]                   PATIENT TYPE:  AMB   LOCATION:  DAY                                  FACILITY:  [facility]   PHYSICIAN:  Jerolyn Shin C. Katrinka Blazing, M.D.                DATE OF BIRTH:  January 06, 1928   DATE OF PROCEDURE:  09/03/2002  DATE OF DISCHARGE:                                 OPERATIVE REPORT   PREOPERATIVE DIAGNOSIS:  1. Cholelithiasis.  2. Cholecystitis.   POSTOPERATIVE DIAGNOSES:  1. Cholelithiasis.  2. Cholecystitis.   PROCEDURE:  Laparoscopic cholecystectomy with cholangiogram.   SURGEON:  Jerolyn Shin C. Katrinka Blazing, M.D.   DESCRIPTION OF PROCEDURE:  Under general oroendotracheal intubation, the  patient's abdomen was prepped and draped in a sterile field.  A  supraumbilical incision was made and a Veress needle was inserted without  any difficulty.  The abdomen was insufflated with 3 L of CO2.  Using a  Visiport guide, a 10 mm port was placed uneventfully.  The laparoscope was  placed, and the slightly edematous gallbladder was visualized.  Under direct  visualization two 5 mm ports and one 10 mm port were placed in a standard  position through the right upper quadrant.  The gallbladder was grasped and  positioned.  The cystic duct was dissected.  It was clipped at its junction  with the gallbladder.  The small endoscopic shears were placed, and the  incision was made in the duct.  A Reddick cholangiocath was placed  transabdominally.  It was positioned in the duct and the balloon dilated.  The balloon was inflated.  Two cholangiograms were taken without difficulty.  Cholangiogram showed no flow into the liver but excellent flow into the  duodenum with no evidence of obstruction of the papilla.  There were no  filling defects.  The catheter was removed.  The cystic duct was clipped  with four clips distally and divided.  The cystic artery was clipped with  four clips and divided.  The  gallbladder was then separated from the  infrahepatic space without difficulty.  It was placed in an EndoCatch device  and retrieved.  No stone was seen, but the stone was only 5 mm.  Copious  irrigation was carried out until the fluid returned clear.  There was no  bleeding from the bed, and there was no bile leak from the bed.  The patient  tolerated the procedure well.  CO2 was then allowed to escape from the  abdomen, and the ports were removed.  The incisions were closed with 0 Dexon  in the fascia of the larger incisions and staples on the skin.  OpSite  dressings were placed.  He was awakened from anesthesia uneventfully,  transferred to a bed, and taken to the postanesthetic care unit.  Dirk Dress. Katrinka Blazing, M.D.    LCS/MEDQ  D:  09/03/2002  T:  09/03/2002  Job:  [id number]   cc:   Madelin Rear. Sherwood Gambler, M.D.  [address]  [place]  [place] [address]  Fax: [phone]   Lionel December, M.D.  [address]  [place]  [place] [address]  Fax: [phone]

## 2011-02-18 NOTE — Group Therapy Note (Signed)
NAME:  [name], [name]                           ACCOUNT NO.:  [id number]   MEDICAL RECORD NO.:  [id number]                   PATIENT TYPE:  INP   LOCATION:  A207                                 FACILITY:  [facility]   PHYSICIAN:  Edward L. Juanetta Gosling, M.D.             DATE OF BIRTH:  04-28-1928   DATE OF PROCEDURE:  06/12/2004  DATE OF DISCHARGE:                                   PROGRESS NOTE   PROBLEM:  Possible FLASH pulmonary edema, respiratory failure resolved,  chronic obstructive pulmonary disease, and hypertension.   SUBJECTIVE:  Mr. Marchi says that he is feeling pretty well and wants to go  home. He has no new complaints.   OBJECTIVE:  VITAL SIGNS:  Temperature 97.4, pulse 87, respiratory rate 20,  blood sugar 249 and has been as high as 266. Blood pressure 146/69. O2  saturation is 96% on 2 liters.  CHEST:  Shows some fairly mild bilateral rhonchi.  HEART:  Regular.   ASSESSMENT:  His CBC shows that his white count is 12,400. Hemoglobin 12.9.  His B-met today with the exception of his glucose of 221 is normal.   ASSESSMENT:  He seems overall much better. I believe that he has significant  chronic obstructive pulmonary disease, which will need to be treated but it  appears that his major problem was likely an episode of FLASH pulmonary  edema related to his very high blood pressure.   PLAN:  I am going to plan to follow him more peripherally now.   Thank you very much for allowing me to see him with you.      ___________________________________________                                            Oneal Deputy. Juanetta Gosling, M.D.   ELH/MEDQ  D:  06/12/2004  T:  06/12/2004  Job:  [id number]

## 2011-02-18 NOTE — Cardiovascular Report (Signed)
NAME:  [name], [name]                           ACCOUNT NO.:  [id number]   MEDICAL RECORD NO.:  [id number]                   PATIENT TYPE:  INP   LOCATION:  4715                                 FACILITY:  [facility]   PHYSICIAN:  Rollene Rotunda, M.D.                DATE OF BIRTH:  1928/09/03   DATE OF PROCEDURE:  06/17/2004  DATE OF DISCHARGE:                              CARDIAC CATHETERIZATION   PRIMARY CARE PHYSICIAN:  Madelin Rear. Sherwood Gambler, M.D.   PRIMARY CARDIOLOGIST:  Vida Roller, M.D.   PROCEDURES PERFORMED:  1.  Left heart catheterization.  2.  Coronary arteriography.   CARDIOLOGIST:  Rollene Rotunda, M.D.   INDICATIONS:  Evaluate patient with acute respiratory distress, pulmonary  edema and abnormal Cardiolite.   PROCEDURAL NOTE:  Left heart catheterization via the left femoral artery;  however, attempted via the right coronary artery.  I was able to cannulate  with an introducer needle.  However, I could not advance a Wholey wire.  I  probably disrupted a shelf-like plaque lesion.  There was good antegrade  flow, however, and no loss of pulses.  After being unable to pass a Wholey  wire a short distance up the femoral I abandoned this approach and proceeded  to the left femoral artery.  This vessel was cannulated easily using an  anterior wall puncture.  A #6 French arterial sheath was inserted via the  modified Seldinger technique.  Preformed Judkins and pigtail catheters were  utilized.   The patient tolerated the procedure well and left the lab in stable  condition.   RESULTS:   HEMODYNAMIC DATA:  LV 157/16.  Aortic output 57/68.   ARTERIOGRAPHIC DATA:  Coronaries:  Left Main:  The left main was normal.   LAD:  The LAD had proximal calcification.  There was proximal 40% stenosis.  There was mid long 25% stenosis.  There were small diagonals.   Circumflex:  The circumflex and the AV groove was narrow.  There was mid 30%  stenosis.  An OM-1 was very large and  branching.  There was a long proximal  30-40% stenosis.  The OM-2 was small.  The OM-3 was small and branching.   Right Coronary Artery:  The right coronary artery was dominant.  There was a  long proximal 30% stenosis.  There was mid long 80% stenosis before the  acute marginal.  There was 75% stenosis after the acute marginal.  There was  moderate diffuse distal disease before the posterolateral.   Left Ventricle:  The left ventricle was not injected.   AORTOGRAPHIC DATA:  Aortogram:  Distal aortogram was obtained demonstrating  bilateral patent renal arteries.  There was no high-grade plaque.  There  seemed to be some haziness in the right femoral where the wire could not  advance; however, there was good antegrade flow down the iliacs and femoral  vessels bilaterally.   CONCLUSION:  Diffuse coronary disease as described.  The most stenotic  lesions are in the right coronary; however, these were not ischemic on  recent Cardiolite.   I will review this with Dr. Dorethea Clan, but would probably suggest aggressive  secondary risk reduction rather than percutaneous revascularization.                                               Rollene Rotunda, M.D.    JH/MEDQ  D:  06/17/2004  T:  06/17/2004  Job:  [id number]   cc:   Vida Roller, M.D.  [facility]  [place] [place] [address]. Sherwood Gambler, M.D.  [address]  [place]  [place] [address]  Fax: [phone]

## 2011-02-18 NOTE — Group Therapy Note (Signed)
NAME:  [name], [name]                           ACCOUNT NO.:  [id number]   MEDICAL RECORD NO.:  [id number]                   PATIENT TYPE:  INP   LOCATION:  IC04                                 FACILITY:  [facility]   PHYSICIAN:  Edward L. Juanetta Gosling, M.D.             DATE OF BIRTH:  1928-02-06   DATE OF PROCEDURE:  06/11/2004  DATE OF DISCHARGE:                                   PROGRESS NOTE   SUBJECTIVE:  Mr. Magallanes says he feels well.  He has no complaints.  He  apparently had no more nausea during the night.  He has remained off the  ventilator and has done well with that.   PHYSICAL EXAMINATION:  VITAL SIGNS:  Exam this morning shows his heart rate  is 107 and he is in sinus rhythm, blood pressure 150/72, O2 saturation 97%.  CHEST:  Clear.   LABORATORY DATA:  Blood gas this morning shows a PO2 of 75, PCO2 45, pH 7.42  this on two liters nasal cannula.  BNP this morning 113.  CBC shows his  white count is 18,000, hemoglobin 13.2 and his BMET shows potassium is 3.4,  glucose 179, BUN 26, creatinine 1.4.  His echocardiogram although of poor  quality study showed that the left ventricular appears to be normal size  with normal systolic function.   ASSESSMENT:  This is a puzzling situation.  Clinically this appears to have  been congestive heart failure or at least pulmonary edema and he very well  may have had flash pulmonary edema.  His BNP and echocardiogram pretty much  inconsistent with that, however.  He did have an acute event but it is not  actually clear exactly what happened.   PLAN:  To continue with treatments he is on for now.  He is not having any  nausea so we are going to let him eat.      ___________________________________________                                            Oneal Deputy. Juanetta Gosling, M.D.   ELH/MEDQ  D:  06/11/2004  T:  06/11/2004  Job:  [id number]   cc:   Madelin Rear. Sherwood Gambler, M.D.  [address]  [place]  [place] [address]  Fax: [phone]

## 2011-02-18 NOTE — Discharge Summary (Signed)
NAME:  [name], [name]                           ACCOUNT NO.:  [id number]   MEDICAL RECORD NO.:  [id number]                   PATIENT TYPE:  INP   LOCATION:  A207                                 FACILITY:  [facility]   PHYSICIAN:  Madelin Rear. Sherwood Gambler, M.D.             DATE OF BIRTH:  [birth year]   DATE OF ADMISSION:  06/10/2004  DATE OF DISCHARGE:  06/14/2004                                 DISCHARGE SUMMARY   DISCHARGE MEDICATIONS:  Not applicable.  Transferred to [facility]  by [facility].   DISCHARGE DIAGNOSES:  1.  Respiratory failure.  2.  Congestive heart failure.  3.  Probable ischemic left ventricular diastolic dysfunction with resultant      congestive heart failure.   SUMMARY:  The patient was admitted after respiratory failure ensued in the  absence of other symptoms.  He was intubated and supported on the  ventilator.  He was seen by pulmonary medicine as well as cardiology.  He  improved rapidly and was subsequently transferred to [facility] for  catheterization to rule out critical coronary artery disease, and renal  angiogram to rule out renovascular hypertension.      LJF/MEDQ  D:  06/21/2004  T:  06/21/2004  Job:  [id number]

## 2011-02-18 NOTE — H&P (Signed)
NAME:  [name], [name]                           ACCOUNT NO.:  [id number]   MEDICAL RECORD NO.:  [id number]                   PATIENT TYPE:  INP   LOCATION:  IC04                                 FACILITY:  [facility]   PHYSICIAN:  Madelin Rear. Sherwood Gambler, M.D.             DATE OF BIRTH:  [birth year]   DATE OF ADMISSION:  06/10/2004  DATE OF DISCHARGE:                                HISTORY & PHYSICAL   CHIEF COMPLAINT:  Shortness of breath.   HISTORY OF PRESENT ILLNESS:  The patient had progressively increasing  shortness of breath to the point where he could hardly speak and presented  to the emergency department in respiratory distress.  There were no episodes  of chest pain, palpitations or syncope.  He had no cough or sputum  production known, although the history had to be gleaned from the ER  physician and review of the family interview as the patient is intubated and  unable to speak at present, but he specifically, with nodding and shaking of  head, answered the questions about chest pain.  He had progressively  increasing lower extremity edema as well noted.   PAST MEDICAL HISTORY:  1.  Noninsulin dependent diabetes mellitus.  2.  Hypertension.  3.  Status post pancreatitis and cholecystectomy in December of 2003.   SOCIAL HISTORY:  Positive smoking, negative alcohol or other use.   FAMILY HISTORY:  Noncontributory except for positive diabetes mellitus,  arteriosclerotic cardiovascular disease and hypertension.   REVIEW OF SYSTEMS:  As under HPI.  Limited secondary to intubated state, but  negative.   PHYSICAL EXAMINATION:  GENERAL:  He is easily arousable with Diprivan  running at present, intubated.  HEAD AND NECK:  Shows no JVD or adenopathy.  Neck was supple.  CHEST:  Diffuse, sonorous rhonchi without wheezing or rales noted.  CARDIAC:  Regular rhythm without gallop or rub.  ABDOMEN:  Soft, no organomegaly or masses.  EXTREMITIES:  There is 2+ bipedal edema.   LABORATORY  STUDIES:  Initial blood gas in the emergency department revealed  hypercapnia and acute respiratory acidosis and hypoxemia.  He was  subsequently intubated with correction of his arterial blood gasses.   His white count was markedly-elevated at 18.9.  H&H was okay with a normal  platelet count.  Electrolytes unrevealing.  Modest hyperglycemia at 247.  Liver function tests unrevealing.  Initial cardiac enzymes were negative.  Electrocardiogram reveals sinus tachycardia, frequent PVCs, but no acute  ischemia or infarction.  Left anterior hemiblock was incidentally noted.   Chest x-ray was reviewed and shows some congestive heart failure with left  lower lobe atelectasis, no acute infiltrates noted.   IMPRESSION:  1.  Respiratory failure secondary to new onset of congestive heart failure;      however, the patient does have a minimally-elevated BNP suggesting      possibly an obstructive component as well.  2.  CT  chest will be obtained today to rule out pulmonary embolus.  3.  Bronchodilators will be continued.  4.  Echocardiogram and cardiac consultation and serial cardiac enzymes will      be obtained as well.  5.  We will consult pulmonary regarding ventilator management.     ___________________________________________                                         Madelin Rear. Sherwood Gambler, M.D.   LJF/MEDQ  D:  06/10/2004  T:  06/10/2004  Job:  [id number]

## 2011-02-18 NOTE — H&P (Signed)
   NAME:  [name], [name]                           ACCOUNT NO.:  [id number]   MEDICAL RECORD NO.:  [id number]                   PATIENT TYPE:  AMB   LOCATION:  DAY                                  FACILITY:  [facility]   PHYSICIAN:  Jerolyn Shin C. Katrinka Blazing, M.D.                DATE OF BIRTH:  29-Mar-1928   DATE OF ADMISSION:  DATE OF DISCHARGE:                                HISTORY & PHYSICAL   HISTORY OF PRESENT ILLNESS:  [age]-year-old male with history of  severe abdominal pain starting back in October.  He had the pain five weeks  before he was evaluated.  He was seen in the emergency room and released at  that time.  He developed pancreatitis and had an endoscopic ultrasound.  Endoscopic ultrasound showed a 5 mm stone int he gallbladder, but otherwise  was unremarkable.  He was referred by Dr. Gerilyn Nestle for a cholecystectomy.  He  will have intraoperative cholangiogram during the time of cholecystectomy.   PAST HISTORY:  The patient's has hypertension and diabetes mellitus.   MEDICATIONS:  1. Verapamil SR 240 mg q.d.  2. Altace 5 mg q.d.  3. Glucophage XR 500 mg 2 q.a.m. and 2 q.p.m.  4. Glyburide 5 mg 1 t.i.d.   PAST SURGICAL HISTORY:  The patient has had no previous surgery.   FAMILY HISTORY:  Family history is positive for diabetes mellitus,  atherosclerotic heart disease, hypertension,  gastroesophageal reflux  disease, and bronchitis.   SOCIAL HISTORY:  The patient is married.  Retired.  Smokes one pack of  cigarettes per day.  Does not drink or use drugs.   PHYSICAL EXAMINATION:  VITAL SIGNS:  On examination blood pressure 140/70,  pulse 80, respirations 16, weight 200 pounds and height 5 feet 9 inches.  HEENT:  Unremarkable.  NECK:  Supple.  No JVD or bruits.  CHEST:  Clear to auscultation.  No rales, rubs, rhonchi or wheezes.  HEART:  Regular rate and rhythm with no murmur, gallop or rub.  ABDOMEN:  Soft, nontender and no masses.  EXTREMITIES:  No clubbing, cyanosis or  edema.  NEUROLOGIC EXAMINATION:  No focal motor, sensory or cerebellar deficit.   IMPRESSION:  1. Biliary pancreatitis with cholelithiasis.  2.     Hypertension.  3. Diabetes mellitus.   PLAN:  Laparoscopic cholecystectomy with intraoperative cholangiogram.                                                 Jerolyn Shin C. Katrinka Blazing, M.D.    LCS/MEDQ  D:  09/03/2002  T:  09/03/2002  Job:  [id number]

## 2011-02-18 NOTE — Discharge Summary (Signed)
   NAME:  [name], [name]                           ACCOUNT NO.:  [id number]   MEDICAL RECORD NO.:  [id number]                   PATIENT TYPE:  INP   LOCATION:  A203                                 FACILITY:  [facility]   PHYSICIAN:  Madelin Rear. Sherwood Gambler, M.D.             DATE OF BIRTH:  [birth year]   DATE OF ADMISSION:  08/03/2002  DATE OF DISCHARGE:  08/06/2002                                 DISCHARGE SUMMARY   DISCHARGE DIAGNOSES:  1. Gastritis and abdominal pain.  2. Chest pain, probable gastrointestinal etiology.  3. Non-insulin-dependent diabetes mellitus.  4. Hypertension.   DISCHARGE MEDICATIONS:  1. Clonidine 0.2 mg TTS patch every week.  2. Verapamil 240 mg SR q.d.  3. Carafate 1 g p.o. q.i.d.  4. Altace 5 mg p.o. q.d.  5. Glucophage 500 mg p.o. q.d.  6. Glyburide 5 mg p.o. q.d.  7. Clarithromycin 500 mg p.o. b.i.d.  8. Amoxicillin 1000 mg p.o. b.i.d.  9. Protonix 40 mg p.o. b.i.d.  10.      Lopressor 50 mg p.o. b.i.d.   SUMMARY:  The patient was admitted with recurrent epigastric and chest  discomfort.  Due to his cardiac risk factors he was admitted for rule out MI  protocol.  This was successfully done with serial enzymes and EKGs.  He was  noted to be Helicobacter pylori positive and was initiated on antibiotic  therapy for same.  Initially he was thought to have a little bit of  pancreatitis based on computerized tomography exam.  He was eating a full  liquid diet on the day of discharge, being advanced, and was asymptomatic.  He will be followed up for a possible endoscopic ultrasound per  gastroenterology as an outpatient.                                               Madelin Rear. Sherwood Gambler, M.D.    LJF/MEDQ  D:  08/06/2002  T:  08/06/2002  Job:  [id number]

## 2011-02-18 NOTE — Procedures (Signed)
   NAME:  [name], [name]                           ACCOUNT NO.:  [id number]   MEDICAL RECORD NO.:  [id number]                   PATIENT TYPE:  EMS   LOCATION:  ED                                   FACILITY:  [facility]   PHYSICIAN:  Edward L. Juanetta Gosling, M.D.             DATE OF BIRTH:  [birth year]   DATE OF PROCEDURE:  07/25/2002  DATE OF DISCHARGE:  07/25/2002                                EKG INTERPRETATION   The rhythm is a sinus rhythm with a rate in the 80s.  There is left axis  deviation.  There is poor R-wave inferiorly and this could indicate a  previous inferior infarction and clinical correlation is suggested.  Abnormal electrocardiogram.                                               Oneal Deputy. Juanetta Gosling, M.D.    ELH/MEDQ  D:  07/26/2002  T:  07/26/2002  Job:  [id number]

## 2011-02-18 NOTE — Discharge Summary (Signed)
[name], [name]                 ACCOUNT NO.:  [id number]   MEDICAL RECORD NO.:  [id number]          PATIENT TYPE:  INP   LOCATION:  4715                         FACILITY:  [facility]   PHYSICIAN:  Gene Serpe, P.A. [facility]   DATE OF BIRTH:  Jan 27, 1928   DATE OF ADMISSION:  06/14/2004  DATE OF DISCHARGE:  06/18/2004                           DISCHARGE SUMMARY - REFERRING   PROCEDURE:  Cardiac catheterization September 15.   REASON FOR ADMISSION:  [name] is a 77 year old male who comes in with no  prior history of heart disease but multiple cardiac risk factors who  initially presented to [facility] in acute respiratory distress  requiring intubation.  Following stabilization, the patient underwent  cardiac workup per Dr. Dorethea Clan consisting of a dobutamine Cardiolite.  This  was worrisome for balanced ischemia suggestive of multivessel coronary  artery disease and arrangements were made for transfer to [facility] for  diagnostic coronary angiography.  Please refer to Dr. Marchelle Folks consultation  note for full details.   LABORATORY DATA:  Potassium 4.2, glucose 223, BUN 40, creatinine 1.6, on  admission, BUN 37, creatinine 1.7, BUN 16, creatinine 1.2 on discharge.   HOSPITAL COURSE:  Following transfer from [facility], the patient  was maintained on aggressive diuretic regimen _____________. The patient had  an echocardiogram revealing _______________ three vessel coronary artery  disease.  ________________ extensively reviewed by Drs. Hochrein, Stuckey  ____________. It was felt that the coronary anatomy _________________.   At discharge, the following medication adjustments were made:  Altace was  resumed, Imdur was added, and Lasix was changed to once daily oral dosing.   Of note, the patient has not had a fasting lipid profile.  This will be done  when the patient presents in one week for follow up metabolic profile.   DISCHARGE MEDICATIONS:  Glucophage as  previously directed, glyburide 5 mg  t.i.d., Actos 15 mg daily, coated aspirin 325 mg daily, Altace 5 mg daily,  Norvasc 5 mg daily, Lasix 40 mg daily, K-Dur 20 mEq daily, Zocor 40 mg  q.h.s., Imdur 30 mg daily, nitroglycerin 0.4 mg p.r.n.   DISCHARGE INSTRUCTIONS:  1.  Continue holding verapamil until further instructions.  2.  Follow-up metabolic profile/fasting lipid profile on Wednesday      (September 21) at the [facility] in [place].  3.  Follow up with Dr. Vida Roller on Friday, October 7, at 10:45 a.m. in      [facility].  4.  Arrange follow up with Dr. Sherwood Gambler in one week for diabetes management.   DISCHARGE DIAGNOSIS:  1.  Three vessel coronary artery disease.      1.  Diffuse coronary artery disease with 80% distal right coronary          artery, consider medical management versus bypass surgery versus          percutaneous coronary intervention of right coronary artery.      2.  Status post left cardiac catheterization September 15.      3.  Balanced ischemia by dobutamine Cardiolite September 12.  4.  Normal left ventricular function by 2D echocardiogram.  2.  Status post ventilator dependent respiratory failure.      1.  Secondary to flash pulmonary edema/hypertensive crisis.  3.  Hypertension.  4.  Status post renal insufficiency.  5.  Type 2 diabetes mellitus.  6.  Chronic obstructive pulmonary disease/continue tobacco.  7.  Peripheral vascular disease.      1.  A 30% ostial left common iliac stenosis.       GS/MEDQ  D:  06/18/2004  T:  06/18/2004  Job:  [id number]   cc:   Madelin Rear. Sherwood Gambler, M.D.  [address]  [place]  [place] [address]  Fax: [phone]

## 2011-02-18 NOTE — Procedures (Signed)
NAME:  ESTELL, DILLINGER                           ACCOUNT NO.:  [id number]   MEDICAL RECORD NO.:  [id number]                   PATIENT TYPE:  INP   LOCATION:  IC04                                 FACILITY:  [facility]   PHYSICIAN:  Vida Roller, M.D.                DATE OF BIRTH:  Jul 10, 1928   DATE OF PROCEDURE:  06/10/2004  DATE OF DISCHARGE:                                  ECHOCARDIOGRAM   PRIMARY CARE PHYSICIAN:  Dr. Madelin Rear. Fusco.   TAPE NUMBER:  [id number], tape count [id number].   INDICATIONS:  A 77 year old man in the intensive care unit on a ventilator.  This is for LV function.  No previous echocardiograms.   TECHNICAL QUALITY:  The technical quality of this study is extremely poor  secondary to the patient's body habitus, and inability to manipulate the  patient due to the ventilator.   FINDINGS:  Overall, it appears that the left ventricle is normal size with  normal systolic function.  Estimated ejection fraction was 60 to 65%.  Assessment of wall motion is beyond the limits of this study as is valvular  architecture or function.  By Doppler criteria, there is no significant  mitral regurgitation or aortic stenosis seen.      ___________________________________________                                            Vida Roller, M.D.   JH/MEDQ  D:  06/10/2004  T:  06/10/2004  Job:  [id number]

## 2011-02-18 NOTE — Procedures (Signed)
NAME:  [name], [name]                           ACCOUNT NO.:  [id number]   MEDICAL RECORD NO.:  [id number]                   PATIENT TYPE:  INP   LOCATION:  A207                                 FACILITY:  [facility]   PHYSICIAN:  Vida Roller, M.D.                DATE OF BIRTH:  [birth year]   DATE OF PROCEDURE:  06/14/2004  DATE OF DISCHARGE:                                    STRESS TEST   HISTORY:  [name] is a 77 year old gentleman with no known coronary  disease who was admitted with ventilator dependent respiratory failure,  possibly secondary to flash pulmonary edema.  Cardiac risk factors include  diabetes, hypertension, tobacco abuse, and hyperlipidemia.   BASELINE DATA:  EKG reveals a sinus rhythm at 85 beats per minute with non-  specific ST abnormalities.  Blood pressure is 122/62.   Dobutamine was infused up to 30 mcg with the addition of leg lift to reach  target heart rate.  Maximum heart rate was 149, which is 102% of predicted  maximum.  Maximum blood pressure was 182/70, which resolved down to 132/68  in recovery.  EKG revealed no ischemic changes and a few PVC's.  The patient  denied any symptoms.   Final images and results are pending M.D. review.     ________________________________________  ___________________________________________  Jae Dire, P.A. [facility]                      Vida Roller, M.D.   AB/MEDQ  D:  06/14/2004  T:  06/14/2004  Job:  [id number]

## 2011-02-18 NOTE — Op Note (Signed)
NAMEVARNELL, [name]                             ACCOUNT NO.:  [id number]   MEDICAL RECORD NO.:  [id number]                  PATIENT TYPE:   LOCATION:                                       FACILITY:   PHYSICIAN:  Lionel December, M.D.                 DATE OF BIRTH:   DATE OF PROCEDURE:  08/04/2002  DATE OF DISCHARGE:                                 OPERATIVE REPORT   PROCEDURE:  Esophagogastroduodenoscopy.   ENDOSCOPIST:  Lionel December, M.D.   INDICATIONS:  This patient is a 77 year old Caucasian male who presents with  a 2-week history of upper abdominal pain exacerbated with meals who has  changes of focal peritonitis involving the head of the pancreas as well as 2  tiny air bubbles outside the duodenum.  It is suspected that he may have a  penetrating peptic ulcer disease.  He is undergoing diagnostic EGD.  The  procedure and risks were reviewed with the patient and an informed consent  was obtained.   PREOPERATIVE MEDICATIONS:  Cetacaine spray for oropharyngeal topical  anesthesia, Demerol 15 mg IV and Versed 3 mg IV in divided dose.   INSTRUMENT:  Olympus video system.   FINDINGS:  Procedure performed in endoscopy suite.  The patient's vital  signs and O2 saturation were monitored during the procedure and remained  stable.  The patient was placed in the left lateral recumbent position and  endoscope was passed via the oropharynx without any difficulty into the  esophagus.   ESOPHAGUS:  Mucosa of the esophagus was normal throughout.  He had a small  sliding hiatal hernia.   STOMACH:  It was empty and distended very well with insufflation.  The folds  of the proximal stomach were normal.  Examination of the mucosa revealed  antral erythema and granularity, but there was no ulceration.  The pyloric  channel was patent.  Angularis and fundus were examined by retroflexing the  scope and were normal.   DUODENUM:  Examination of the bulb revealed normal mucosa.  The scope  was  passed into the second and third part of the duodenum where mucosa and folds  were normal.   The endoscope was withdrawn.  The patient tolerated the procedure well.   FINAL DIAGNOSES:  1. Small sliding hiatal hernia.  2. Nonerosive antral gastritis.  3. No evidence of peptic ulcer disease that might explain his acute illness.  4. I wonder if we are dealing with intestinal ischemia or other causes of     pancreatitis such as neoplasm.   RECOMMENDATIONS:  Will continue her IV PPI and antibiotic for now.  We will  start him on clear liquids.  We will consider EUS on an outpatient basis.  However, if pain relapses with oral feeding we will consider an MRA of  abdomen.  Lionel December, M.D.    NR/MEDQ  D:  08/04/2002  T:  08/05/2002  Job:  [id number]   cc:   Madelin Rear. Sherwood Gambler, M.D.  [address]  [place]  [place] [address]  Fax: [phone]

## 2011-02-18 NOTE — H&P (Signed)
NAME:  [name], [name]                           ACCOUNT NO.:  [id number]   MEDICAL RECORD NO.:  [id number]                   PATIENT TYPE:  INP   LOCATION:  A203                                 FACILITY:  [facility]   PHYSICIAN:  Gracelyn Nurse, M.D.              DATE OF BIRTH:  02-Aug-1928   DATE OF ADMISSION:  08/03/2002  DATE OF DISCHARGE:                                HISTORY & PHYSICAL   CHIEF COMPLAINT:  Abdominal pain.   HISTORY OF PRESENT ILLNESS:  This is a 77 year old white male with a two-  week history of abdominal pain.  He describes the pain as midepigastric,  worse after eating.  He also feels like he has a knot that comes up in his  low sternal area after he eats.  He is currently being worked up as an  outpatient by his primary M.D. including ultrasound and laboratories.  He  has not experienced any nausea or vomiting and has not experienced any black  tarry stools or bright red blood.   PAST MEDICAL HISTORY:  1. Non-insulin-dependent diabetes.  2. Hyperlipidemia.  3. Hypertension.   ALLERGIES:  SULFA.   CURRENT MEDICATIONS:  1. Glyburide 5 mg t.i.d.  2. Glucophage XR 500 mg b.i.d.  3. Carafate q.i.d.  4. Verapamil 240 mg SR q.d.  5. Altace 5 mg q.d.   SOCIAL HISTORY:  He smokes two packs of cigarettes a day.  Does not drink  any alcohol.  He is married, with four children.   FAMILY HISTORY:  Mother died at age 35 of old age.  Father died in his 51s  of lung cancer.   REVIEW OF SYSTEMS:  As per HPI.  All other systems reviewed and are normal.   PHYSICAL EXAMINATION:  VITAL SIGNS:  Temperature is 98.4, pulse 98,  respirations 22, blood pressure 194/82.  GENERAL:  Well-nourished white male in no acute distress.  HEENT:  Pupils are equal, round, and reactive to light.  Extraocular  movements intact.  Oral mucosa is dry.  Oropharynx is clear.  CARDIOVASCULAR:  Regular rate and rhythm.  No murmurs.  LUNGS:  Clear to auscultation.  ABDOMEN:  Obese,  nondistended.  Bowel sounds positive.  Tender in the  epigastric area, with some mild rebound.  EXTREMITIES:  No edema.  NEUROLOGIC:  Cranial nerves II-XII grossly intact.  No focal deficits.  Strength is 5/5 bilaterally.  SKIN:  Moist.  No rash.   ADMISSION LABORATORY DATA:  Lipase 103, amylase 98.  White blood count 11.1,  hemoglobin 13.6, platelets 272.  Sodium 133, potassium 3.5, chloride 105,  CO2 26, BUN 12, creatinine 1.0, glucose 241.  Total bilirubin 0.4, alkaline  phosphatase 82, SGOT 16, SGPT 17.   CT scan of the abdomen shows inflamed pancreas with one to two small air  bubbles near the head of the pancreas.   ASSESSMENT AND PLAN:  1. Pancreatitis:  Will make him n.p.o.  Give him pain medications and IV     fluids.  Will also start him on some antibiotics.  The etiology is     unclear at this time; however, we do suspect the possibility of     perforated ulcer on history and CT scan.  Will consult GI for possible     EGD to evaluate this.  2. Questionable perforated ulcer:  As above, will consult GI.  If he does     have a perforated ulcer, a decision will have to be made about whether he     requires surgery or not.  3. Non-insulin-dependent diabetes:  Will hold his current medications and     put him on sliding scale insulin.  4. Hypertension:  I would like to keep him n.p.o. right now.  Will go ahead     and put a clonidine patch on him until he is able to take his p.o.     medicines.                                               Gracelyn Nurse, M.D.    JDJ/MEDQ  D:  08/03/2002  T:  08/04/2002  Job:  [id number]

## 2011-02-18 NOTE — Consult Note (Signed)
NAME:  [name], [name]                           ACCOUNT NO.:  [id number]   MEDICAL RECORD NO.:  [id number]                   PATIENT TYPE:  INP   LOCATION:  IC04                                 FACILITY:  [facility]   PHYSICIAN:  Edward L. Juanetta Gosling, M.D.             DATE OF BIRTH:  1928-03-04   DATE OF CONSULTATION:  DATE OF DISCHARGE:                                   CONSULTATION   REASON FOR CONSULTATION:  Respiratory failure.   HISTORY:  [name] is a 77 year old with a history of diabetes,  hypertension and chronic obstructive pulmonary disease who came to the  hospital with shortness of breath.  At the time, he was felt to have acute  respiratory distress.  He was intubated and placed on mechanical  ventilation.  When he was treated in the emergency room, his blood pressure  was quite high at 270/145.  He is now alert, awake, motions that he would  like to have his endotracheal tube removed.  In addition to that, his family  gives a history that he had been coughing at home.   PAST MEDICAL HISTORY:  Positive for diabetes, hypertension, peptic ulcer  disease, pancreatitis, chronic obstructive pulmonary disease and  degenerative joint disease.   MEDICATIONS AT HOME:  1.  Actos 15 mg daily.  2.  Glyburide 5 mg, three times a day.  3.  Verapamil 240 mg daily.  4.  Glucophage 500 mg daily.  5.  Altace 5 mg a day.   SOCIAL HISTORY:  He is married and lives at home with his wife.  He is  retired.  He does have a 50-pack a year smoking history.  He does not use  any alcohol.   FAMILY HISTORY:  Positive for diabetes and cardiac disease.  I do not know  all of the details of that at this point.   REVIEW OF SYSTEMS:  As mentioned, really unobtainable.   PHYSICAL EXAMINATION:  GENERAL:  He is intubated but awake and alert,  wanting to have the tube removed.  CHEST:  Fairly clear with some wheezes.  HEART:  Regular without gallops.  ABDOMEN:  Soft.  He has trace edema of the legs.   ASSESSMENT:  He has had some sort of an acute event.  It is not clear  exactly what that event was.   PLAN:  My plan would be to go ahead with a CT of the chest to make sure that  we are not missing a pulmonary embolus.  Assuming that is negative, try to  get him off the ventilator later today.      ___________________________________________                                            Oneal Deputy. Juanetta Gosling, M.D.  ELH/MEDQ  D:  06/11/2004  T:  06/11/2004  Job:  [id number]

## 2011-02-18 NOTE — Consult Note (Signed)
[name], PEAK                           ACCOUNT NO.:  [id number]   MEDICAL RECORD NO.:  [id number]                   PATIENT TYPE:  INP   LOCATION:  A203                                 FACILITY:  [facility]   PHYSICIAN:  Lionel December, M.D.                 DATE OF BIRTH:  1928/03/26   DATE OF CONSULTATION:  08/04/2002  DATE OF DISCHARGE:                                   CONSULTATION   REASON FOR CONSULTATION:  Upper abdominal pain.  CT suggests focal  pancreatitis and two tiny air bubbles which are extraluminal, pancreatic and  peptic ulcer disease suspected.   HISTORY OF PRESENT ILLNESS:  The patient is a 77 year old Caucasian male who  was admitted to Dr. Madelin Rear. Fusco last night via emergency room, where  he presented with unrelenting epigastric pain.  His present illness began  exactly two weeks ago when he started to have postprandial epigastric pain  radiating to his chest associated with some nausea.  There has been  intermittent pain.  He was seen in the emergency room last week and had some  studies done.  He had ultrasound on August 01, 2002 which revealed no  abnormality to the liver, gallbladder, bile duct and the pancreas, but  pancreas was not well-seen.  His H. pylori serology was positive and he was  begun on therapy on August 01, 2002, but he noted his pain was getting  worse.  Last evening, pain was unrelenting.  He states that yesterday  morning when he woke up, he was pain-free and he drove up to [place] and  had a breakfast.  About an hour later, he noted this pain and it kept  getting worse.  Evaluation in the emergency room revealed WBC of 11.1,  hemoglobin and hematocrit of 13.6 and 39.2.  Platelet count was 272,000.  He  had 74 segs and 0 bands.  His sodium was 133, potassium 3.5, chloride 105,  CO2 was 26, glucose 241, BUN 12, creatinine 1, bilirubin 0.4, alkaline  phosphatase 82, AST 16, ALT 17, total protein 6.2 with albumin of 3.1, serum  calcium was 8.6, his amylase was 98 and lipase was mildly elevated at 103.  He had abdominopelvic CT which showed indistinct or hazy head of the  pancreas with surrounding stranding and one to two tiny air bubbles in the  head of the pancreas which were felt to be extraluminal, therefore, a  posterior penetrating ulcer was suspected.  The patient was begun on  Protonix IV as well as Unasyn 3 g IV q.6h.  He feels a lot better.  He now  has soreness in his epigastrium.  He is actually hungry.  He denies  vomiting, dysphagia, heartburn, melena or rectal bleeding.  He did have an  episode where his stool was black but it was checked and was guaiac-  negative.  He has lost 6  to 7 pounds in the last two weeks.  He does not  take any OTCs, NSAIDs or herbs.   MEDICATIONS:  His usual medications at home include:  1. Verapamil SR -- ? dose q.d.  2. Glucophage XR 500 mg b.i.d.  3. Glyburide 5 mg t.i.d.  4. Altace 5 mg every day.  5. Carafate 1 g q.i.d.  6. He was just started on Prevpac four days ago.   PAST MEDICAL HISTORY:  He has been diabetic for 17 or 18 years; he has also  been hypertensive and both of these conditions have been well-controlled  with therapy.  He has never had any surgeries or hospitalization.   ALLERGIES:  Allergy to SULFA causing a rash.   FAMILY HISTORY:  Family history significant for diabetes mellitus in three  siblings who are otherwise doing well.  His father was also diabetic; he is  deceased.  Mother lived to be 77.   SOCIAL HISTORY:  He is retired.  He worked in the [employer] 21 years and  subsequently did all kinds of jobs.  He does not drink alcohol but he has  been smoking about a pack a day for the last 50 years.  He has four  children.   PHYSICAL EXAMINATION:  GENERAL:  Pleasant, well-developed, well-nourished  Caucasian male who is in no acute distress.  He weighs 197.1 pounds.  He is  5-feet 9-inches tall.  VITAL SIGNS:  Pulse 88 per minute, blood  pressure 197/101, respiratory rate  is 20 and temperature is 98.5.  HEENT:  Conjunctivae are pink.  Sclerae are nonicteric.  Oropharyngeal  mucosa is normal.  He has upper and lower dentures in place.  NECK:  Neck without masses or thyromegaly.  CARDIAC:  Exam with a regular rhythm, normal S1 and S2.  No murmur or gallop  noted.  LUNGS:  Auscultation of the lungs reveal vesicular breath sounds  bilaterally.  No rales or rhonchi noted.  ABDOMEN:  His abdomen is full.  Bowel sounds are normal.  Palpation reveals  a soft abdomen, mild tenderness at epigastrium to the right of midline and  some below the right costal margin.  No hepatosplenomegaly or organomegaly.  RECTAL:  Exam is deferred as he had one in the ER revealing guaiac-negative  stool.  EXTREMITIES:  He does not have peripheral edema or clubbing.   LABORATORY AND ACCESSORY CLINICAL DATA:  Labs as above plus his WBC today is  10.3, hemoglobin and hematocrit are 13.6 and 39.6, platelet count is 258,000  and he has 73 segs.  His BUN was 7, creatinine 0.8.   I have reviewed his CT with Dr. Audie Pinto.  He also had plain abdominal  film which showed contrast in the colon and mild dilatation to small bowel  but no air-fluid levels noted.   ASSESSMENT:  The patient is a 77 year old Caucasian male who presents with a  two-week history of intermittent abdominal pain which became intractable  last evening.  He was recently diagnosed with Helicobacter pylori infection  and therapy was begun but he has only taken it for a couple of days.  The  recent ultrasound was negative for cholelithiasis or dilated bile duct.  CT  suggests changes of focal pancreatitis involving the head, body and tail  appear to be quite normal.  He does have two tiny air bubbles which appear  medial to the duodenal loop and possibly are extraluminal.  However, he does not have free air or  pneumoperitoneum.  Given this presentation and the fact  that he had  Helicobacter pylori infection, it is likely that we are dealing  with a posterior penetrating ulcer and esophagogastroduodenoscopy in this  setting would be appropriate.  Other differential diagnosis that should be  mentioned would be pancreatic carcinoma or intestinal ischemia, given 50-  year history of cigarette smoking and diabetes mellitus.   RECOMMENDATIONS:  Diagnostic esophagogastroduodenoscopy to be performed this  a.m.  In the meantime, we will continue his Unasyn and Protonix.  Further  recommendations will be made, depending on endoscopic findings.   I would like to thank Dr. Gracelyn Nurse and Dr. Madelin Rear. Fusco for  giving [place] the opportunity to participate in the care of this gentleman.                                               Lionel December, M.D.    NR/MEDQ  D:  08/04/2002  T:  08/04/2002  Job:  [id number]

## 2011-03-08 ENCOUNTER — Encounter: Payer: Self-pay | Admitting: Cardiology

## 2011-03-14 ENCOUNTER — Ambulatory Visit (INDEPENDENT_AMBULATORY_CARE_PROVIDER_SITE_OTHER): Payer: Medicare Other | Admitting: *Deleted

## 2011-03-14 DIAGNOSIS — I4891 Unspecified atrial fibrillation: Secondary | ICD-10-CM

## 2011-03-14 DIAGNOSIS — I495 Sick sinus syndrome: Secondary | ICD-10-CM

## 2011-03-14 NOTE — Progress Notes (Signed)
Pacer check

## 2011-03-30 ENCOUNTER — Encounter: Payer: Self-pay | Admitting: Cardiology

## 2011-03-30 ENCOUNTER — Ambulatory Visit (INDEPENDENT_AMBULATORY_CARE_PROVIDER_SITE_OTHER): Payer: Medicare Other | Admitting: Cardiology

## 2011-03-30 DIAGNOSIS — F17200 Nicotine dependence, unspecified, uncomplicated: Secondary | ICD-10-CM

## 2011-03-30 DIAGNOSIS — E785 Hyperlipidemia, unspecified: Secondary | ICD-10-CM

## 2011-03-30 DIAGNOSIS — E119 Type 2 diabetes mellitus without complications: Secondary | ICD-10-CM

## 2011-03-30 DIAGNOSIS — I2510 Atherosclerotic heart disease of native coronary artery without angina pectoris: Secondary | ICD-10-CM

## 2011-03-30 DIAGNOSIS — I4891 Unspecified atrial fibrillation: Secondary | ICD-10-CM

## 2011-03-30 DIAGNOSIS — I10 Essential (primary) hypertension: Secondary | ICD-10-CM

## 2011-03-30 NOTE — Progress Notes (Signed)
HPI The patient presents followup of his CAD.  Since I last saw him he has had no cardiovascular problems. He said no new dyspnea, PND or orthopnea. He continues to smoke cigarettes. He said he noted chest pressure, neck or arm discomfort. He is having no weight gain or edema. There have been no recent pulmonary infections.  Allergies  Allergen Reactions  . Albuterol     REACTION: agitation  . Statins   . Sulfamethoxazole     REACTION: unspecified  . Sulfonamide Derivatives     Current Outpatient Prescriptions  Medication Sig Dispense Refill  . alfuzosin (UROXATRAL) 10 MG 24 hr tablet Take 10 mg by mouth daily.        Marland Kitchen aspirin 81 MG tablet Take 81 mg by mouth daily.        Marland Kitchen ezetimibe (ZETIA) 10 MG tablet Take 10 mg by mouth daily.        . furosemide (LASIX) 80 MG tablet Take 80 mg by mouth daily as needed.        . glyBURIDE (DIABETA) 5 MG tablet Take 5 mg by mouth 3 (three) times daily.       . hydrALAZINE (APRESOLINE) 25 MG tablet 25 mg. Take 1/2 tab three times daily       . iron polysaccharides (NU-IRON) 150 MG capsule Take 150 mg by mouth daily.        . isosorbide mononitrate (IMDUR) 30 MG 24 hr tablet Take 30 mg by mouth 2 (two) times daily.        Marland Kitchen levalbuterol (XOPENEX) 0.63 MG/3ML nebulizer solution Take 1 ampule by nebulization as needed.        . miglitol (GLYSET) 50 MG tablet Take 50 mg by mouth 3 (three) times daily.        . nitroGLYCERIN (NITROSTAT) 0.4 MG SL tablet Place 0.4 mg under the tongue as needed.        Marland Kitchen omeprazole (PRILOSEC) 20 MG capsule Take 20 mg by mouth daily.        Marland Kitchen tiotropium (SPIRIVA) 18 MCG inhalation capsule Place 18 mcg into inhaler and inhale daily.          Past Medical History  Diagnosis Date  . Respiratory failure     secondary to pleural effusion, severe COPD, ongoing tobacco abuse, and congestive heart failure)  . Heart block     with sinus pauses (status post St. Jude pacemaker by Dr. Graciela Husbands)  . Chronic renal insufficiency   .  Chronic atrial fibrillation     (no Coumadin secondary to GI bleeding)  . Diabetes mellitus   . Coronary artery disease 2009    last catheterization in 2009 demonstrate a 3-vessel CAD . He underwent stenting with a right  coronary artery with a  PROMUS drug-eluting stent. He  also had a stenting of his circumflex with drug-eluting  stent previously))  . Cardiomyopathy     (EF 40%)  . Degenerative joint disease   . Hypertension   . Hyperlipidemia   . Peptic ulcer disease   . Peripheral vascular disease     Past Surgical History  Procedure Date  . Cholecystectomy   . Insert / replace / remove pacemaker     St. Jude    ROS:  As stated in the HPI and negative for all other systems.  PHYSICAL EXAM BP 104/58  Pulse 70  Resp 20  Ht 5\' 9"  (1.753 m)  Wt 185 lb (83.915 kg)  BMI 27.32 kg/m2  GENERAL:  Chronically ill appearing HEENT:  Pupils equal round and reactive, fundi not visualized, oral mucosa unremarkable, edentulous NECK:  No jugular venous distention, waveform within normal limits, carotid upstroke brisk and symmetric, no bruits, no thyromegaly LYMPHATICS:  No cervical, inguinal adenopathy LUNGS:  Clear to auscultation bilaterally BACK:  No CVA tenderness CHEST:  Unremarkable HEART:  PMI not displaced or sustained,S1 and S2 within normal limits, no S3, no clicks, no rubs, no murmurs, distant heart sounds. ABD:  Flat, positive bowel sounds normal in frequency in pitch, no bruits, no rebound, no guarding, no midline pulsatile mass, no hepatomegaly, no splenomegaly EXT:  2 plus pulses upper diminished DP/PT, no edema, no cyanosis no clubbing SKIN:  No rashes no nodules NEURO:  Cranial nerves II through XII grossly intact, motor grossly intact throughout PSYCH:  Cognitively intact, oriented to person place and time   EKG: Atrial fibrillation, ventricular pacing  ASSESSMENT AND PLAN

## 2011-03-30 NOTE — Assessment & Plan Note (Signed)
The blood pressure is at target. No change in medications is indicated. We will continue with therapeutic lifestyle changes (TLC).  

## 2011-03-30 NOTE — Assessment & Plan Note (Signed)
The patient has no new sypmtoms.  No further cardiovascular testing is indicated.  We will continue with aggressive risk reduction and meds as listed.  

## 2011-03-30 NOTE — Assessment & Plan Note (Signed)
He tolerates this rhythm.  He is not a candidate for anticoagulation with previous GI bleeding and he would not take this.

## 2011-03-30 NOTE — Patient Instructions (Addendum)
Follow up with Dr Antoine Poche in 6 months Continue current medications as listed

## 2011-03-30 NOTE — Assessment & Plan Note (Signed)
He has no desire to stop smoking although we discuss this at each appt.

## 2011-06-27 LAB — DIFFERENTIAL
Basophils Absolute: 0
Basophils Absolute: 0.1
Basophils Relative: 0
Basophils Relative: 0
Basophils Relative: 1
Basophils Relative: 1
Eosinophils Absolute: 0
Eosinophils Relative: 0
Lymphocytes Relative: 11 — ABNORMAL LOW
Lymphocytes Relative: 11 — ABNORMAL LOW
Lymphocytes Relative: 26
Lymphs Abs: 0.8
Monocytes Relative: 2 — ABNORMAL LOW
Monocytes Relative: 7
Neutro Abs: 5.9
Neutro Abs: 9.1 — ABNORMAL HIGH
Neutro Abs: 9.5 — ABNORMAL HIGH
Neutrophils Relative %: 64
Neutrophils Relative %: 86 — ABNORMAL HIGH
Neutrophils Relative %: 90 — ABNORMAL HIGH

## 2011-06-27 LAB — BASIC METABOLIC PANEL
BUN: 49 — ABNORMAL HIGH
CO2: 25
CO2: 25
CO2: 25
Calcium: 9.1
Calcium: 9.1
Calcium: 9.1
Calcium: 9.1
Calcium: 9.2
Calcium: 9.5
Chloride: 102
Chloride: 100
Chloride: 102
Creatinine, Ser: 1.45
Creatinine, Ser: 1.63 — ABNORMAL HIGH
Creatinine, Ser: 1.65 — ABNORMAL HIGH
Creatinine, Ser: 1.66 — ABNORMAL HIGH
Creatinine, Ser: 1.78 — ABNORMAL HIGH
Creatinine, Ser: 1.79 — ABNORMAL HIGH
GFR calc Af Amer: 59 — ABNORMAL LOW
GFR calc Af Amer: 45 — ABNORMAL LOW
GFR calc Af Amer: 45 — ABNORMAL LOW
GFR calc Af Amer: 49 — ABNORMAL LOW
GFR calc Af Amer: 49 — ABNORMAL LOW
GFR calc Af Amer: 50 — ABNORMAL LOW
GFR calc Af Amer: 57 — ABNORMAL LOW
GFR calc non Af Amer: 36 — ABNORMAL LOW
GFR calc non Af Amer: 37 — ABNORMAL LOW
GFR calc non Af Amer: 37 — ABNORMAL LOW
GFR calc non Af Amer: 41 — ABNORMAL LOW
GFR calc non Af Amer: 47 — ABNORMAL LOW
Glucose, Bld: 127 — ABNORMAL HIGH
Glucose, Bld: 243 — ABNORMAL HIGH
Glucose, Bld: 267 — ABNORMAL HIGH
Glucose, Bld: 288 — ABNORMAL HIGH
Potassium: 5
Sodium: 136
Sodium: 134 — ABNORMAL LOW
Sodium: 138

## 2011-06-27 LAB — URINE CULTURE
Colony Count: NO GROWTH
Culture: NO GROWTH
Special Requests: NEGATIVE

## 2011-06-27 LAB — URINALYSIS, ROUTINE W REFLEX MICROSCOPIC
Bilirubin Urine: NEGATIVE
Glucose, UA: 100 — AB
Glucose, UA: NEGATIVE
Hgb urine dipstick: NEGATIVE
Ketones, ur: NEGATIVE
Ketones, ur: NEGATIVE
Nitrite: NEGATIVE
Specific Gravity, Urine: 1.015
pH: 5
pH: 5.5

## 2011-06-27 LAB — CBC
HCT: 34.3 — ABNORMAL LOW
Hemoglobin: 11.6 — ABNORMAL LOW
MCHC: 34.3
MCHC: 34.7
MCHC: 35
MCHC: 35.2
MCV: 91.4
MCV: 91.8
Platelets: 236
RBC: 3.4 — ABNORMAL LOW
RBC: 3.49 — ABNORMAL LOW
RBC: 3.71 — ABNORMAL LOW
RBC: 4.2 — ABNORMAL LOW
RDW: 14.4
RDW: 14.5
RDW: 14.6
WBC: 10.2
WBC: 10.9 — ABNORMAL HIGH
WBC: 11.6 — ABNORMAL HIGH
WBC: 14.9 — ABNORMAL HIGH

## 2011-06-27 LAB — HEMOGLOBIN AND HEMATOCRIT, BLOOD
HCT: 36.7 — ABNORMAL LOW
Hemoglobin: 12.8 — ABNORMAL LOW

## 2011-06-27 LAB — CARDIAC PANEL(CRET KIN+CKTOT+MB+TROPI)
CK, MB: 28.7 — ABNORMAL HIGH
Relative Index: 9.4 — ABNORMAL HIGH
Total CK: 267 — ABNORMAL HIGH
Troponin I: 6.19

## 2011-06-27 LAB — PROTIME-INR
INR: 1.1
INR: 1.2
INR: 1.5
Prothrombin Time: 17.5 — ABNORMAL HIGH
Prothrombin Time: 18.7 — ABNORMAL HIGH
Prothrombin Time: 19.1 — ABNORMAL HIGH

## 2011-06-27 LAB — APTT: aPTT: 30

## 2011-06-27 LAB — EXPECTORATED SPUTUM ASSESSMENT W GRAM STAIN, RFLX TO RESP C

## 2011-06-27 LAB — CULTURE, BLOOD (ROUTINE X 2)
Culture: NO GROWTH
Report Status: 3272009

## 2011-06-27 LAB — B-NATRIURETIC PEPTIDE (CONVERTED LAB)
Pro B Natriuretic peptide (BNP): 204 — ABNORMAL HIGH
Pro B Natriuretic peptide (BNP): 401 — ABNORMAL HIGH

## 2011-06-27 LAB — CK TOTAL AND CKMB (NOT AT ARMC)
Relative Index: INVALID
Total CK: 41

## 2011-06-27 LAB — HEMOGLOBIN A1C: Mean Plasma Glucose: 250

## 2011-06-27 LAB — POCT CARDIAC MARKERS: Myoglobin, poc: 64.7

## 2011-06-28 LAB — URINALYSIS, ROUTINE W REFLEX MICROSCOPIC
Bilirubin Urine: NEGATIVE
Hgb urine dipstick: NEGATIVE
Ketones, ur: NEGATIVE
Nitrite: NEGATIVE
pH: 5.5

## 2011-06-28 LAB — HEMOGLOBIN AND HEMATOCRIT, BLOOD
HCT: 26.5 — ABNORMAL LOW
HCT: 27.2 — ABNORMAL LOW
HCT: 29.8 — ABNORMAL LOW
Hemoglobin: 10.1 — ABNORMAL LOW
Hemoglobin: 9.2 — ABNORMAL LOW
Hemoglobin: 9.4 — ABNORMAL LOW

## 2011-06-28 LAB — DIFFERENTIAL
Lymphs Abs: 1.4
Monocytes Relative: 9
Neutro Abs: 3.8
Neutrophils Relative %: 61

## 2011-06-28 LAB — BLOOD GAS, ARTERIAL
Bicarbonate: 21.6
FIO2: 0.21
O2 Saturation: 97.8
Patient temperature: 98.6

## 2011-06-28 LAB — TYPE AND SCREEN: ABO/RH(D): O POS

## 2011-06-28 LAB — PROTIME-INR
INR: 1.9 — ABNORMAL HIGH
INR: 3.2 — ABNORMAL HIGH
Prothrombin Time: 22.9 — ABNORMAL HIGH
Prothrombin Time: 31.4 — ABNORMAL HIGH

## 2011-06-28 LAB — CARDIAC PANEL(CRET KIN+CKTOT+MB+TROPI)
CK, MB: 2.5
CK, MB: 2.6
CK, MB: 2.8
Relative Index: INVALID
Total CK: 60
Total CK: 63
Troponin I: 0.04

## 2011-06-28 LAB — ABO/RH: ABO/RH(D): O POS

## 2011-06-28 LAB — MAGNESIUM: Magnesium: 2.3

## 2011-06-28 LAB — LIPID PANEL
LDL Cholesterol: 121 — ABNORMAL HIGH
Total CHOL/HDL Ratio: 3.8
Triglycerides: 104
VLDL: 21

## 2011-06-28 LAB — BASIC METABOLIC PANEL
BUN: 47 — ABNORMAL HIGH
CO2: 26
CO2: 29
Calcium: 8.9
Calcium: 9
Calcium: 9
Chloride: 102
Chloride: 93 — ABNORMAL LOW
Creatinine, Ser: 1.48
Creatinine, Ser: 1.68 — ABNORMAL HIGH
Creatinine, Ser: 1.87 — ABNORMAL HIGH
GFR calc Af Amer: 43 — ABNORMAL LOW
GFR calc Af Amer: 48 — ABNORMAL LOW
GFR calc Af Amer: 56 — ABNORMAL LOW
GFR calc non Af Amer: 46 — ABNORMAL LOW
Glucose, Bld: 109 — ABNORMAL HIGH
Sodium: 133 — ABNORMAL LOW

## 2011-06-28 LAB — CBC
RBC: 2.45 — ABNORMAL LOW
WBC: 6.3

## 2011-06-28 LAB — HEMOGLOBIN A1C: Mean Plasma Glucose: 236

## 2011-06-28 LAB — CULTURE, BLOOD (ROUTINE X 2)
Culture: NO GROWTH
Report Status: 4222009

## 2011-06-28 LAB — PREPARE FRESH FROZEN PLASMA

## 2011-06-28 LAB — HEPATIC FUNCTION PANEL
ALT: 14
AST: 17
Albumin: 3.2 — ABNORMAL LOW
Bilirubin, Direct: 0.1
Total Bilirubin: 0.6

## 2011-06-28 LAB — PHOSPHORUS: Phosphorus: 4.4

## 2011-06-28 LAB — H. PYLORI ANTIBODY, IGG: H Pylori IgG: 1 — ABNORMAL HIGH

## 2011-06-28 LAB — TSH: TSH: 1.847

## 2011-06-29 LAB — BLOOD GAS, ARTERIAL
FIO2: 100
O2 Content: 2
O2 Saturation: 99.7
Patient temperature: 37
Patient temperature: 37
pCO2 arterial: 40.7
pH, Arterial: 7.333 — ABNORMAL LOW
pH, Arterial: 7.42

## 2011-06-29 LAB — CBC
HCT: 26.7 — ABNORMAL LOW
HCT: 29.8 — ABNORMAL LOW
Hemoglobin: 10.5 — ABNORMAL LOW
Hemoglobin: 9.3 — ABNORMAL LOW
MCHC: 34.5
MCHC: 34.5
MCV: 92.3
MCV: 92.6
MCV: 92.8
MCV: 93.1
Platelets: 162
Platelets: 173
Platelets: 190
RBC: 2.88 — ABNORMAL LOW
RBC: 3.09 — ABNORMAL LOW
RDW: 16.5 — ABNORMAL HIGH
WBC: 10.4
WBC: 6.7
WBC: 7.9
WBC: 9.2

## 2011-06-29 LAB — BASIC METABOLIC PANEL
BUN: 32 — ABNORMAL HIGH
BUN: 44 — ABNORMAL HIGH
CO2: 28
Calcium: 9.6
Calcium: 9.8
Chloride: 103
Chloride: 92 — ABNORMAL LOW
Chloride: 93 — ABNORMAL LOW
Chloride: 95 — ABNORMAL LOW
Creatinine, Ser: 1.48
Creatinine, Ser: 1.76 — ABNORMAL HIGH
GFR calc Af Amer: 39 — ABNORMAL LOW
GFR calc Af Amer: 46 — ABNORMAL LOW
GFR calc Af Amer: 56 — ABNORMAL LOW
GFR calc non Af Amer: 33 — ABNORMAL LOW
GFR calc non Af Amer: 33 — ABNORMAL LOW
Glucose, Bld: 98
Potassium: 4.3
Potassium: 4.4
Sodium: 132 — ABNORMAL LOW
Sodium: 134 — ABNORMAL LOW

## 2011-06-29 LAB — DIFFERENTIAL
Basophils Relative: 0
Basophils Relative: 1
Eosinophils Absolute: 0
Eosinophils Absolute: 0
Eosinophils Absolute: 0
Eosinophils Absolute: 0.2
Eosinophils Relative: 0
Eosinophils Relative: 0
Lymphocytes Relative: 13
Lymphocytes Relative: 6 — ABNORMAL LOW
Lymphs Abs: 0.4 — ABNORMAL LOW
Lymphs Abs: 1.2
Lymphs Abs: 1.7
Monocytes Absolute: 0.4
Monocytes Relative: 11
Monocytes Relative: 3
Neutro Abs: 7
Neutro Abs: 7.5
Neutrophils Relative %: 72
Neutrophils Relative %: 89 — ABNORMAL HIGH

## 2011-06-29 LAB — CULTURE, BLOOD (ROUTINE X 2): Culture: NO GROWTH

## 2011-06-29 LAB — POCT CARDIAC MARKERS
CKMB, poc: <1 — ABNORMAL LOW
Myoglobin, poc: 32.9
Troponin i, poc: <0.05

## 2011-06-29 LAB — B-NATRIURETIC PEPTIDE (CONVERTED LAB)
Pro B Natriuretic peptide (BNP): 157 — ABNORMAL HIGH
Pro B Natriuretic peptide (BNP): 413 — ABNORMAL HIGH

## 2011-06-29 LAB — CARDIAC PANEL(CRET KIN+CKTOT+MB+TROPI)
CK, MB: 2.3
Total CK: 36

## 2011-06-29 LAB — HEMOGLOBIN A1C
Hgb A1c MFr Bld: 9.7 — ABNORMAL HIGH
Mean Plasma Glucose: 268

## 2011-06-29 LAB — MAGNESIUM: Magnesium: 2.4

## 2011-06-29 LAB — GLUCOSE, RANDOM
Glucose, Bld: 414 — ABNORMAL HIGH
Glucose, Bld: 422 — ABNORMAL HIGH
Glucose, Bld: 494 — ABNORMAL HIGH

## 2011-07-12 ENCOUNTER — Ambulatory Visit (INDEPENDENT_AMBULATORY_CARE_PROVIDER_SITE_OTHER): Payer: Medicare Other | Admitting: Ophthalmology

## 2011-07-12 DIAGNOSIS — E11319 Type 2 diabetes mellitus with unspecified diabetic retinopathy without macular edema: Secondary | ICD-10-CM

## 2011-07-12 DIAGNOSIS — E11359 Type 2 diabetes mellitus with proliferative diabetic retinopathy without macular edema: Secondary | ICD-10-CM

## 2011-07-12 DIAGNOSIS — H43819 Vitreous degeneration, unspecified eye: Secondary | ICD-10-CM

## 2011-07-13 LAB — HEMOGLOBIN A1C: Mean Plasma Glucose: 211

## 2011-07-13 LAB — I-STAT 8, (EC8 V) (CONVERTED LAB)
Acid-Base Excess: 2
Operator id: 272551
Potassium: 3.9
Sodium: 138
TCO2: 27
pH, Ven: 7.475 — ABNORMAL HIGH

## 2011-07-13 LAB — PROTIME-INR
INR: 1.1
Prothrombin Time: 14.7

## 2011-07-13 LAB — CBC
RBC: 3.85 — ABNORMAL LOW
WBC: 9.7

## 2011-07-13 LAB — LIPID PANEL
Cholesterol: 184
Total CHOL/HDL Ratio: 5
VLDL: 25

## 2011-07-13 LAB — B-NATRIURETIC PEPTIDE (CONVERTED LAB): Pro B Natriuretic peptide (BNP): 288 — ABNORMAL HIGH

## 2011-07-13 LAB — POCT CARDIAC MARKERS: Myoglobin, poc: 133

## 2011-07-13 LAB — CK TOTAL AND CKMB (NOT AT ARMC)
Relative Index: INVALID
Total CK: 50

## 2011-07-13 LAB — CARDIAC PANEL(CRET KIN+CKTOT+MB+TROPI): Relative Index: INVALID

## 2011-07-13 LAB — DIFFERENTIAL
Lymphocytes Relative: 18
Lymphs Abs: 1.7
Monocytes Relative: 10
Neutro Abs: 6.6
Neutrophils Relative %: 68

## 2011-07-15 LAB — DIFFERENTIAL
Eosinophils Relative: 2
Lymphocytes Relative: 9 — ABNORMAL LOW
Lymphs Abs: 1
Monocytes Absolute: 0.9 — ABNORMAL HIGH
Neutro Abs: 8.8 — ABNORMAL HIGH

## 2011-07-15 LAB — BLOOD GAS, ARTERIAL
Bicarbonate: 23.2
pCO2 arterial: 31.3 — ABNORMAL LOW
pH, Arterial: 7.482 — ABNORMAL HIGH
pO2, Arterial: 128 — ABNORMAL HIGH

## 2011-07-15 LAB — CBC
HCT: 36 — ABNORMAL LOW
Hemoglobin: 12.1 — ABNORMAL LOW
WBC: 10.9 — ABNORMAL HIGH

## 2011-07-15 LAB — B-NATRIURETIC PEPTIDE (CONVERTED LAB): Pro B Natriuretic peptide (BNP): 175 — ABNORMAL HIGH

## 2011-07-15 LAB — POCT CARDIAC MARKERS
CKMB, poc: 1
Myoglobin, poc: 101
Troponin i, poc: <0.05

## 2011-07-15 LAB — BASIC METABOLIC PANEL
GFR calc Af Amer: >60
GFR calc non Af Amer: 56 — ABNORMAL LOW
Potassium: 4.4
Sodium: 140

## 2011-07-25 ENCOUNTER — Encounter (INDEPENDENT_AMBULATORY_CARE_PROVIDER_SITE_OTHER): Payer: Medicare Other | Admitting: Ophthalmology

## 2011-07-25 DIAGNOSIS — H3581 Retinal edema: Secondary | ICD-10-CM

## 2011-09-07 ENCOUNTER — Encounter: Payer: Self-pay | Admitting: Cardiology

## 2011-09-14 ENCOUNTER — Encounter: Payer: Self-pay | Admitting: Cardiology

## 2011-09-14 ENCOUNTER — Ambulatory Visit (INDEPENDENT_AMBULATORY_CARE_PROVIDER_SITE_OTHER): Payer: Medicare Other | Admitting: Cardiology

## 2011-09-14 DIAGNOSIS — I2510 Atherosclerotic heart disease of native coronary artery without angina pectoris: Secondary | ICD-10-CM

## 2011-09-14 DIAGNOSIS — F17200 Nicotine dependence, unspecified, uncomplicated: Secondary | ICD-10-CM

## 2011-09-14 DIAGNOSIS — I4891 Unspecified atrial fibrillation: Secondary | ICD-10-CM

## 2011-09-14 NOTE — Assessment & Plan Note (Signed)
He has no intention of ever stopping smoking.

## 2011-09-14 NOTE — Progress Notes (Signed)
HPI The patient presents followup of his CAD.  Since I last saw him he has had no cardiovascular problems. He said no new dyspnea, PND or orthopnea. He continues to smoke cigarettes.  He has a chronic cough which is unchanged. He's had chronic lower extremity swelling unchanged. He denies any PND or orthopnea.  Allergies  Allergen Reactions  . Albuterol     REACTION: agitation  . Statins   . Sulfamethoxazole     REACTION: unspecified  . Sulfonamide Derivatives     Current Outpatient Prescriptions  Medication Sig Dispense Refill  . alfuzosin (UROXATRAL) 10 MG 24 hr tablet Take 10 mg by mouth daily.        Marland Kitchen aspirin 81 MG tablet Take 81 mg by mouth daily.        Marland Kitchen ezetimibe (ZETIA) 10 MG tablet Take 10 mg by mouth daily.        . furosemide (LASIX) 80 MG tablet Take 80 mg by mouth daily as needed.        . glyBURIDE (DIABETA) 5 MG tablet Take 5 mg by mouth 2 (two) times daily with a meal.       . hydrALAZINE (APRESOLINE) 25 MG tablet 25 mg. Take 1/2 tab three times daily      . isosorbide mononitrate (IMDUR) 30 MG 24 hr tablet Take 30 mg by mouth 2 (two) times daily.        . miglitol (GLYSET) 50 MG tablet Take 50 mg by mouth 3 (three) times daily.        . nitroGLYCERIN (NITROSTAT) 0.4 MG SL tablet Place 0.4 mg under the tongue as needed.          Past Medical History  Diagnosis Date  . Respiratory failure     secondary to pleural effusion, severe COPD, ongoing tobacco abuse, and congestive heart failure)  . Heart block     with sinus pauses (status post St. Jude pacemaker by Dr. Graciela Husbands)  . Chronic renal insufficiency   . Chronic atrial fibrillation     (no Coumadin secondary to GI bleeding)  . Diabetes mellitus   . Coronary artery disease 2009    last catheterization in 2009 demonstrate a 3-vessel CAD . He underwent stenting with a right  coronary artery with a  PROMUS drug-eluting stent. He  also had a stenting of his circumflex with drug-eluting  stent previously))  .  Cardiomyopathy     (EF 40%)  . Degenerative joint disease   . Hypertension   . Hyperlipidemia   . Peptic ulcer disease   . Peripheral vascular disease     Past Surgical History  Procedure Date  . Cholecystectomy   . Insert / replace / remove pacemaker     St. Jude    ROS:  As stated in the HPI and negative for all other systems.  PHYSICAL EXAM BP 124/66  Pulse 63  Resp 18  Ht 5\' 9"  (1.753 m)  Wt 192 lb (87.091 kg)  BMI 28.35 kg/m2 GENERAL:  Chronically ill appearing HEENT:  Pupils equal round and reactive, fundi not visualized, oral mucosa unremarkable, edentulous NECK:  No jugular venous distention, waveform within normal limits, carotid upstroke brisk and symmetric, no bruits, no thyromegaly LYMPHATICS:  No cervical, inguinal adenopathy LUNGS:  Clear to auscultation bilaterally BACK:  No CVA tenderness CHEST:  Unremarkable HEART:  PMI not displaced or sustained,S1 and S2 within normal limits, no S3, no clicks, no rubs, no murmurs, distant heart sounds. ABD:  Flat, positive bowel sounds normal in frequency in pitch, no bruits, no rebound, no guarding, no midline pulsatile mass, no hepatomegaly, no splenomegaly EXT:  2 plus pulses upper absent DP/PT, mild ankle edema, no cyanosis no clubbing SKIN:  No rashes no nodules NEURO:  Cranial nerves II through XII grossly intact, motor grossly intact throughout PSYCH:  Cognitively intact, oriented to person place and time  EKG: Atrial fibrillation, ventricular pacing rate 61.  09/14/2011  ASSESSMENT AND PLAN

## 2011-09-14 NOTE — Patient Instructions (Signed)
The current medical regimen is effective;  continue present plan and medications.  Follow up in 6 months with Dr Hochrein.  You will receive a letter in the mail 2 months before you are due.  Please call us when you receive this letter to schedule your follow up appointment.  

## 2011-09-14 NOTE — Assessment & Plan Note (Signed)
He tolerates this rhythm with his demand pacemaker. He's not an anticoagulation candidate. No change in therapy is indicated. He will continue to have followup of his pacemaker.

## 2011-09-14 NOTE — Assessment & Plan Note (Signed)
He has no ongoing symptoms and I will continue to attempt risk reduction.

## 2011-10-14 ENCOUNTER — Ambulatory Visit (INDEPENDENT_AMBULATORY_CARE_PROVIDER_SITE_OTHER): Payer: Medicare Other | Admitting: Internal Medicine

## 2011-10-14 ENCOUNTER — Encounter: Payer: Self-pay | Admitting: Internal Medicine

## 2011-10-14 VITALS — BP 156/60 | HR 65 | Ht 69.0 in | Wt 187.0 lb

## 2011-10-14 DIAGNOSIS — F17200 Nicotine dependence, unspecified, uncomplicated: Secondary | ICD-10-CM

## 2011-10-14 DIAGNOSIS — Z950 Presence of cardiac pacemaker: Secondary | ICD-10-CM

## 2011-10-14 DIAGNOSIS — I4891 Unspecified atrial fibrillation: Secondary | ICD-10-CM

## 2011-10-14 DIAGNOSIS — I5023 Acute on chronic systolic (congestive) heart failure: Secondary | ICD-10-CM

## 2011-10-14 DIAGNOSIS — I495 Sick sinus syndrome: Secondary | ICD-10-CM

## 2011-10-14 LAB — PACEMAKER DEVICE OBSERVATION
BMOD-0002RV: 10
BRDY-0002RV: 60 {beats}/min
BRDY-0004RV: 110 {beats}/min
BRDY-0005RV: 50 {beats}/min
DEVICE MODEL PM: 2164677
RV LEAD THRESHOLD: 0.375 V

## 2011-10-14 MED ORDER — LEVALBUTEROL TARTRATE 45 MCG/ACT IN AERO: 2.0000 | INHALATION_SPRAY | Freq: Two times a day (BID) | RESPIRATORY_TRACT | Status: DC

## 2011-10-14 NOTE — Assessment & Plan Note (Signed)
His permanent pacemaker is working normally. We will plan to recheck in several months.

## 2011-10-14 NOTE — Assessment & Plan Note (Addendum)
This is a new problem for the patient. I have recommended a chest x-ray today and he refuses. I have asked the patient to increase his Lasix from one tablet daily to one tablet in the morning and one tablet after lunch for 3 days. He has followup scheduled with his primary physician in 4 days. If he is not improved, he will likely require hospitalization. I have discussed this with the patient. He denies chest pain. He may ultimately require initiation of carvedilol but I am not inclined to start this today as he has increased dyspnea. The patient has a history of COPD and is not on an inhaler. Today he will start Xopenex.

## 2011-10-14 NOTE — Assessment & Plan Note (Signed)
Despite my request, he continues to smoke cigarettes. He is smoking one pack of cigarettes daily. He refuses to stop smoking.

## 2011-10-14 NOTE — Assessment & Plan Note (Signed)
His ventricular rate appears to be controlled. He will continue his current medical therapy. He has a history of gastrointestinal bleeding and is not on Coumadin.

## 2011-10-14 NOTE — Progress Notes (Signed)
HPI [name] returns today for followup. He is a very pleasant 78 year old man with symptomatic bradycardia, chronic atrial fibrillation, status post permanent pacemaker insertion. He has coronary disease and moderate left ventricular dysfunction. He is not a candidate for Coumadin secondary to gastrointestinal bleeding. The patient notes that he has had increasing dyspnea over the past few weeks. He has also had a cough but no fever. His legs are swollen. Cough is non-productive. Allergies  Allergen Reactions  . Albuterol     REACTION: agitation  . Statins   . Sulfamethoxazole     REACTION: unspecified  . Sulfonamide Derivatives      Current Outpatient Prescriptions  Medication Sig Dispense Refill  . alfuzosin (UROXATRAL) 10 MG 24 hr tablet Take 10 mg by mouth daily.        Marland Kitchen aspirin 81 MG tablet Take 81 mg by mouth daily.        Marland Kitchen ezetimibe (ZETIA) 10 MG tablet Take 10 mg by mouth daily.        . furosemide (LASIX) 80 MG tablet Take 80 mg by mouth daily as needed.        . glyBURIDE (DIABETA) 5 MG tablet Take 5 mg by mouth 2 (two) times daily with a meal.       . hydrALAZINE (APRESOLINE) 25 MG tablet 25 mg. Take 1/2 tab three times daily      . isosorbide mononitrate (IMDUR) 30 MG 24 hr tablet Take 30 mg by mouth 2 (two) times daily.        . miglitol (GLYSET) 50 MG tablet Take 50 mg by mouth 3 (three) times daily.        . nitroGLYCERIN (NITROSTAT) 0.4 MG SL tablet Place 0.4 mg under the tongue as needed.           Past Medical History  Diagnosis Date  . Respiratory failure     secondary to pleural effusion, severe COPD, ongoing tobacco abuse, and congestive heart failure)  . Heart block     with sinus pauses (status post St. Jude pacemaker by Dr. Graciela Husbands)  . Chronic renal insufficiency   . Chronic atrial fibrillation     (no Coumadin secondary to GI bleeding)  . Diabetes mellitus   . Coronary artery disease 2009    last catheterization in 2009 demonstrate a 3-vessel CAD . He  underwent stenting with a right  coronary artery with a  PROMUS drug-eluting stent. He  also had a stenting of his circumflex with drug-eluting  stent previously))  . Cardiomyopathy     (EF 40%)  . Degenerative joint disease   . Hypertension   . Hyperlipidemia   . Peptic ulcer disease   . Peripheral vascular disease     ROS:   All systems reviewed and negative except as noted in the HPI.   Past Surgical History  Procedure Date  . Cholecystectomy   . Insert / replace / remove pacemaker     St. Jude     Family History  Problem Relation Age of Onset  . Coronary artery disease Neg Hx      History   Social History  . Marital Status: Married    Spouse Name: N/A    Number of Children: N/A  . Years of Education: N/A   Occupational History  . retired     Hotel manager   Social History Main Topics  . Smoking status: Current Everyday Smoker -- 1.0 packs/day for 50 years    Types: Cigarettes  Last Attempt to Quit: 06/15/2007  . Smokeless tobacco: Not on file  . Alcohol Use: Not on file  . Drug Use: Not on file  . Sexually Active: Not on file   Other Topics Concern  . Not on file   Social History Narrative  . No narrative on file     BP 156/60  Pulse 65  Ht 5\' 9"  (1.753 m)  Wt 84.823 kg (187 lb)  BMI 27.62 kg/m2  Physical Exam:  ill appearing elderly man, NAD HEENT: Unremarkable Neck:  7 cm JVD, no thyromegally Lungs:  Scattered rales and wheezes throughout. No increased work of breathing at rest. HEART:  IRegular rate rhythm, no murmurs, no rubs, no clicks Abd:  soft, positive bowel sounds, no organomegally, no rebound, no guarding Ext:  2 plus pulses,  2+edema, no cyanosis, no clubbing Skin:  No rashes no nodules Neuro:  CN II through XII intact, motor grossly intact  DEVICE  Normal device function.  See PaceArt for details.   Assess/Plan:

## 2011-10-14 NOTE — Progress Notes (Signed)
Addended by: Reather Laurence A on: 10/14/2011 08:58 AM   Modules accepted: Orders

## 2011-10-14 NOTE — Patient Instructions (Signed)
Your physician recommends that you schedule a follow-up appointment in:  6 months  Your physician has recommended you make the following change in your medication:  1 - INCRESE Lasix (furosemide) to 80 mg in the am and after lunch for 3 daily then resume 1 time daily thereafter 2 - Xopenex inhaler 2 puffs every 12 hours  Low Salt diet (Information included)

## 2011-10-18 ENCOUNTER — Encounter: Payer: Self-pay | Admitting: Internal Medicine

## 2011-10-20 ENCOUNTER — Other Ambulatory Visit: Payer: Self-pay | Admitting: Cardiology

## 2011-11-30 ENCOUNTER — Ambulatory Visit (INDEPENDENT_AMBULATORY_CARE_PROVIDER_SITE_OTHER): Payer: Medicare Other | Admitting: Ophthalmology

## 2011-11-30 DIAGNOSIS — H43819 Vitreous degeneration, unspecified eye: Secondary | ICD-10-CM

## 2011-11-30 DIAGNOSIS — E1139 Type 2 diabetes mellitus with other diabetic ophthalmic complication: Secondary | ICD-10-CM

## 2011-11-30 DIAGNOSIS — E11319 Type 2 diabetes mellitus with unspecified diabetic retinopathy without macular edema: Secondary | ICD-10-CM

## 2012-04-20 ENCOUNTER — Ambulatory Visit (INDEPENDENT_AMBULATORY_CARE_PROVIDER_SITE_OTHER): Payer: Medicare Other | Admitting: *Deleted

## 2012-04-20 ENCOUNTER — Encounter: Payer: Self-pay | Admitting: Internal Medicine

## 2012-04-20 DIAGNOSIS — I495 Sick sinus syndrome: Secondary | ICD-10-CM

## 2012-04-20 LAB — PACEMAKER DEVICE OBSERVATION
BRDY-0002RV: 60 {beats}/min
BRDY-0004RV: 110 {beats}/min
BRDY-0005RV: 50 {beats}/min
RV LEAD THRESHOLD: 0.375 V

## 2012-04-20 NOTE — Progress Notes (Signed)
PPM check 

## 2012-06-08 ENCOUNTER — Ambulatory Visit (INDEPENDENT_AMBULATORY_CARE_PROVIDER_SITE_OTHER): Payer: Medicare Other | Admitting: Ophthalmology

## 2012-06-08 DIAGNOSIS — E1139 Type 2 diabetes mellitus with other diabetic ophthalmic complication: Secondary | ICD-10-CM

## 2012-06-08 DIAGNOSIS — E11319 Type 2 diabetes mellitus with unspecified diabetic retinopathy without macular edema: Secondary | ICD-10-CM

## 2012-06-08 DIAGNOSIS — H43819 Vitreous degeneration, unspecified eye: Secondary | ICD-10-CM

## 2012-10-15 ENCOUNTER — Encounter: Payer: Self-pay | Admitting: Internal Medicine

## 2012-10-15 ENCOUNTER — Ambulatory Visit (INDEPENDENT_AMBULATORY_CARE_PROVIDER_SITE_OTHER): Payer: Medicare Other | Admitting: Internal Medicine

## 2012-10-15 VITALS — BP 114/60 | HR 78 | Ht 69.0 in | Wt 191.1 lb

## 2012-10-15 DIAGNOSIS — I4891 Unspecified atrial fibrillation: Secondary | ICD-10-CM

## 2012-10-15 DIAGNOSIS — Z950 Presence of cardiac pacemaker: Secondary | ICD-10-CM

## 2012-10-15 DIAGNOSIS — I495 Sick sinus syndrome: Secondary | ICD-10-CM

## 2012-10-15 DIAGNOSIS — I5023 Acute on chronic systolic (congestive) heart failure: Secondary | ICD-10-CM

## 2012-10-15 LAB — PACEMAKER DEVICE OBSERVATION
BATTERY VOLTAGE: 2.79 V
BMOD-0002RV: 10
BRDY-0004RV: 110 {beats}/min
DEVICE MODEL PM: 2164677
RV LEAD AMPLITUDE: 12 mv
VENTRICULAR PACING PM: 35

## 2012-10-15 NOTE — Progress Notes (Signed)
HPI [name] returns today for followup. He is a very pleasant 79 year old man with a history of atrial fibrillation, COPD, ongoing tobacco abuse, symptomatic bradycardia, status post permanent pacemaker insertion. He is not thought to be an anticoagulation candidate secondary to gastrointestinal bleeding. When I saw him last a year ago, he was experiencing increasing shortness of breath.  Since then he has done well with no chest pain or shortness of breath. He denies peripheral edema. He does admit to a sedentary lifestyle. Allergies  Allergen Reactions  . Albuterol     REACTION: agitation  . Statins   . Sulfamethoxazole     REACTION: unspecified  . Sulfonamide Derivatives      Current Outpatient Prescriptions  Medication Sig Dispense Refill  . alfuzosin (UROXATRAL) 10 MG 24 hr tablet Take 10 mg by mouth daily.        Marland Kitchen aspirin 81 MG tablet Take 81 mg by mouth daily.        Marland Kitchen ezetimibe (ZETIA) 10 MG tablet Take 10 mg by mouth daily.        . furosemide (LASIX) 80 MG tablet Take 1 tablet (80 mg total) by mouth as directed.  60 each  6  . glyBURIDE (DIABETA) 5 MG tablet Take 5 mg by mouth 2 (two) times daily with a meal.       . hydrALAZINE (APRESOLINE) 25 MG tablet 25 mg. Take 1/2 tab three times daily      . isosorbide mononitrate (IMDUR) 30 MG 24 hr tablet Take 30 mg by mouth 2 (two) times daily.        Marland Kitchen levalbuterol (XOPENEX HFA) 45 MCG/ACT inhaler Inhale 2 puffs into the lungs every 12 (twelve) hours.      . miglitol (GLYSET) 50 MG tablet Take 50 mg by mouth 3 (three) times daily.        . nitroGLYCERIN (NITROSTAT) 0.4 MG SL tablet Place 0.4 mg under the tongue as needed.           Past Medical History  Diagnosis Date  . Respiratory failure     secondary to pleural effusion, severe COPD, ongoing tobacco abuse, and congestive heart failure)  . Heart block     with sinus pauses (status post St. Jude pacemaker by Dr. Graciela Husbands)  . Chronic renal insufficiency   . Chronic atrial  fibrillation     (no Coumadin secondary to GI bleeding)  . Diabetes mellitus   . Coronary artery disease 2009    last catheterization in 2009 demonstrate a 3-vessel CAD . He underwent stenting with a right  coronary artery with a  PROMUS drug-eluting stent. He  also had a stenting of his circumflex with drug-eluting  stent previously))  . Cardiomyopathy     (EF 40%)  . Degenerative joint disease   . Hypertension   . Hyperlipidemia   . Peptic ulcer disease   . Peripheral vascular disease   . Sinoatrial node dysfunction     ROS:   All systems reviewed and negative except as noted in the HPI.   Past Surgical History  Procedure Date  . Cholecystectomy   . Insert / replace / remove pacemaker     St. Jude     Family History  Problem Relation Age of Onset  . Coronary artery disease Neg Hx      History   Social History  . Marital Status: Married    Spouse Name: N/A    Number of Children: N/A  . Years  of Education: N/A   Occupational History  . retired     Hotel manager   Social History Main Topics  . Smoking status: Current Every Day Smoker -- 1.0 packs/day for 50 years    Types: Cigarettes    Last Attempt to Quit: 06/15/2007  . Smokeless tobacco: Not on file  . Alcohol Use: Not on file  . Drug Use: Not on file  . Sexually Active: Not on file   Other Topics Concern  . Not on file   Social History Narrative  . No narrative on file     BP 114/60  Pulse 78  Ht 5\' 9"  (1.753 m)  Wt 191 lb 1.9 oz (86.691 kg)  BMI 28.22 kg/m2  SpO2 98%  Physical Exam:  Well appearing 79 year old man, NAD HEENT: Unremarkable Neck:  No JVD, no thyromegally, carotids without bruit Lungs:  Clear except for scattered rales in the bases. HEART:  IRegular rate rhythm, no murmurs, no rubs, no clicks Abd:  soft, positive bowel sounds, no organomegally, no rebound, no guarding Ext:  2 plus pulses, no edema, no cyanosis, no clubbing Skin:  No rashes no nodules Neuro:  CN II through  XII intact, motor grossly intact  DEVICE  Normal device function.  See PaceArt for details.   Assess/Plan:

## 2012-10-15 NOTE — Assessment & Plan Note (Signed)
His pacemaker is working normally. We'll plan to recheck in several months. 

## 2012-10-15 NOTE — Assessment & Plan Note (Signed)
His heart failure symptoms are well compensated. I've encouraged the patient to reduce his salt intake but he is not inclined to do so.

## 2012-10-15 NOTE — Assessment & Plan Note (Signed)
He continues to refuse anticoagulation. His ventricular rate is well controlled.

## 2012-10-15 NOTE — Patient Instructions (Addendum)
Your physician recommends that you schedule a follow-up appointment in:  1 - 1 year with Dr Ladona Ridgel  2 - 6 months with Gunnar Fusi

## 2012-11-10 ENCOUNTER — Other Ambulatory Visit: Payer: Self-pay | Admitting: Internal Medicine

## 2012-12-07 ENCOUNTER — Ambulatory Visit (INDEPENDENT_AMBULATORY_CARE_PROVIDER_SITE_OTHER): Payer: Medicare Other | Admitting: Ophthalmology

## 2012-12-19 ENCOUNTER — Ambulatory Visit: Payer: Medicare Other | Admitting: Cardiology

## 2013-01-09 ENCOUNTER — Ambulatory Visit (INDEPENDENT_AMBULATORY_CARE_PROVIDER_SITE_OTHER): Payer: Medicare Other | Admitting: Ophthalmology

## 2013-01-09 DIAGNOSIS — E1165 Type 2 diabetes mellitus with hyperglycemia: Secondary | ICD-10-CM

## 2013-01-09 DIAGNOSIS — E11319 Type 2 diabetes mellitus with unspecified diabetic retinopathy without macular edema: Secondary | ICD-10-CM

## 2013-01-09 DIAGNOSIS — H43819 Vitreous degeneration, unspecified eye: Secondary | ICD-10-CM

## 2013-01-09 DIAGNOSIS — E1139 Type 2 diabetes mellitus with other diabetic ophthalmic complication: Secondary | ICD-10-CM

## 2013-01-30 ENCOUNTER — Encounter: Payer: Self-pay | Admitting: Cardiology

## 2013-01-30 ENCOUNTER — Ambulatory Visit (INDEPENDENT_AMBULATORY_CARE_PROVIDER_SITE_OTHER): Payer: Medicare Other | Admitting: Cardiology

## 2013-01-30 VITALS — BP 110/50 | HR 62 | Ht 70.0 in | Wt 180.0 lb

## 2013-01-30 DIAGNOSIS — I10 Essential (primary) hypertension: Secondary | ICD-10-CM

## 2013-01-30 DIAGNOSIS — I4891 Unspecified atrial fibrillation: Secondary | ICD-10-CM

## 2013-01-30 DIAGNOSIS — I2510 Atherosclerotic heart disease of native coronary artery without angina pectoris: Secondary | ICD-10-CM

## 2013-01-30 DIAGNOSIS — I495 Sick sinus syndrome: Secondary | ICD-10-CM

## 2013-01-30 DIAGNOSIS — I5023 Acute on chronic systolic (congestive) heart failure: Secondary | ICD-10-CM

## 2013-01-30 NOTE — Progress Notes (Signed)
HPI The patient presents followup of his CAD.  Since I last saw him he has had no cardiovascular problems. He said no new dyspnea, PND or orthopnea. He continues to smoke cigarettes.  His chronic mild lower extremity swelling is mild. He is very limited by joint pains. He gets around a little bit in his house.  He denies any chest pressure, neck or arm discomfort. He doesn't notice his fibrillation and has had no presyncope or syncope.  Allergies  Allergen Reactions  . Albuterol     REACTION: agitation  . Statins   . Sulfamethoxazole     REACTION: unspecified  . Sulfonamide Derivatives     Current Outpatient Prescriptions  Medication Sig Dispense Refill  . alfuzosin (UROXATRAL) 10 MG 24 hr tablet Take 10 mg by mouth daily.        Marland Kitchen aspirin 81 MG tablet Take 81 mg by mouth daily.        Marland Kitchen ezetimibe (ZETIA) 10 MG tablet Take 10 mg by mouth daily.        . furosemide (LASIX) 80 MG tablet Take 1 tablet (80 mg total) by mouth as directed.  60 each  6  . glyBURIDE (DIABETA) 5 MG tablet Take 5 mg by mouth 2 (two) times daily with a meal.       . hydrALAZINE (APRESOLINE) 25 MG tablet 25 mg. Take 1/2 tab three times daily      . isosorbide mononitrate (IMDUR) 30 MG 24 hr tablet Take 30 mg by mouth 2 (two) times daily.        Marland Kitchen levalbuterol (XOPENEX HFA) 45 MCG/ACT inhaler Inhale 2 puffs into the lungs every 12 (twelve) hours.      . miglitol (GLYSET) 50 MG tablet Take 50 mg by mouth 3 (three) times daily.        . nitroGLYCERIN (NITROSTAT) 0.4 MG SL tablet Place 0.4 mg under the tongue as needed.         No current facility-administered medications for this visit.    Past Medical History  Diagnosis Date  . Respiratory failure     secondary to pleural effusion, severe COPD, ongoing tobacco abuse, and congestive heart failure)  . Heart block     with sinus pauses (status post St. Jude pacemaker by Dr. Graciela Husbands)  . Chronic renal insufficiency   . Chronic atrial fibrillation     (no Coumadin  secondary to GI bleeding)  . Diabetes mellitus   . Coronary artery disease 2009    last catheterization in 2009 demonstrate a 3-vessel CAD . He underwent stenting with a right  coronary artery with a  PROMUS drug-eluting stent. He  also had a stenting of his circumflex with drug-eluting  stent previously))  . Cardiomyopathy     (EF 40%)  . Degenerative joint disease   . Hypertension   . Hyperlipidemia   . Peptic ulcer disease   . Peripheral vascular disease   . Sinoatrial node dysfunction     Past Surgical History  Procedure Laterality Date  . Cholecystectomy    . Insert / replace / remove pacemaker      St. Jude    ROS:  As stated in the HPI and negative for all other systems.  PHYSICAL EXAM There were no vitals taken for this visit. GENERAL:  Chronically ill appearing, no acute distress HEENT:  Pupils equal round and reactive, fundi not visualized, oral mucosa unremarkable, edentulous NECK:  No jugular venous distention, waveform within normal limits,  carotid upstroke brisk and symmetric, no bruits, no thyromegaly LYMPHATICS:  No cervical, inguinal adenopathy LUNGS:  Upper expiratory wheezing BACK:  No CVA tenderness CHEST:  Unremarkable, pacemaker in place HEART:  PMI not displaced or sustained,S1 and S2 within normal limits, no S3, no clicks, no rubs, no murmurs, distant heart sounds. ABD:  Flat, positive bowel sounds normal in frequency in pitch, no bruits, no rebound, no guarding, no midline pulsatile mass, no hepatomegaly, no splenomegaly EXT:  2 plus pulses upper absent DP/PT, mild ankle edema, no cyanosis no clubbing SKIN:  No rashes no nodules NEURO:  Cranial nerves II through XII grossly intact, motor grossly intact throughout, diffuse muscle wasting PSYCH:  Cognitively intact, oriented to person place and time  EKG: Atrial fibrillation, ventricular pacing rate 62, no acute ST-T wave changes, left axis deviation, left anterior fascicular block, nonspecific diffuse T  wave changes..  01/30/2013  ASSESSMENT AND PLAN  ATRIAL FIBRILLATION:  He tolerates this rhythm.  With the past bleeding history and also his desire not to take anticoagulation we are not considering this. No change in therapy is indicated.  CAD:  The patient has no new sypmtoms.  No further cardiovascular testing is indicated.  We will continue with aggressive risk reduction and meds as listed.  TOBACCO ABUSE:  He has no intention of quitting smoking.

## 2013-01-30 NOTE — Patient Instructions (Addendum)
The current medical regimen is effective;  continue present plan and medications.  Follow up in 6 months with Dr Hochrein.  You will receive a letter in the mail 2 months before you are due.  Please call us when you receive this letter to schedule your follow up appointment.  

## 2013-04-19 ENCOUNTER — Encounter: Payer: Self-pay | Admitting: Internal Medicine

## 2013-04-19 ENCOUNTER — Ambulatory Visit (INDEPENDENT_AMBULATORY_CARE_PROVIDER_SITE_OTHER): Payer: Medicare Other | Admitting: *Deleted

## 2013-04-19 DIAGNOSIS — I495 Sick sinus syndrome: Secondary | ICD-10-CM

## 2013-04-19 LAB — PACEMAKER DEVICE OBSERVATION
BRDY-0002RV: 60 {beats}/min
BRDY-0004RV: 110 {beats}/min
RV LEAD AMPLITUDE: 12 mv

## 2013-04-19 NOTE — Progress Notes (Signed)
PPM check in office. 

## 2013-10-14 ENCOUNTER — Ambulatory Visit (INDEPENDENT_AMBULATORY_CARE_PROVIDER_SITE_OTHER): Payer: Medicare Other | Admitting: Ophthalmology

## 2013-10-16 ENCOUNTER — Ambulatory Visit (INDEPENDENT_AMBULATORY_CARE_PROVIDER_SITE_OTHER): Payer: Medicare Other | Admitting: Internal Medicine

## 2013-10-16 ENCOUNTER — Encounter: Payer: Self-pay | Admitting: Internal Medicine

## 2013-10-16 VITALS — BP 132/54 | HR 59 | Ht 70.0 in | Wt 190.0 lb

## 2013-10-16 DIAGNOSIS — I495 Sick sinus syndrome: Secondary | ICD-10-CM

## 2013-10-16 DIAGNOSIS — I4891 Unspecified atrial fibrillation: Secondary | ICD-10-CM

## 2013-10-16 DIAGNOSIS — F17200 Nicotine dependence, unspecified, uncomplicated: Secondary | ICD-10-CM

## 2013-10-16 DIAGNOSIS — Z950 Presence of cardiac pacemaker: Secondary | ICD-10-CM

## 2013-10-16 LAB — MDC_IDC_ENUM_SESS_TYPE_INCLINIC
Battery Impedance: 1000 Ohm — CL
Battery Remaining Longevity: 120 mo
Brady Statistic RV Percent Paced: 52 %
Date Time Interrogation Session: 20150114090650
Implantable Pulse Generator Model: 5626
Implantable Pulse Generator Serial Number: 2164677
MDC IDC MSMT BATTERY VOLTAGE: 2.79 V
MDC IDC MSMT LEADCHNL RV IMPEDANCE VALUE: 422 Ohm
MDC IDC MSMT LEADCHNL RV PACING THRESHOLD AMPLITUDE: 0.375 V
MDC IDC MSMT LEADCHNL RV PACING THRESHOLD PULSEWIDTH: 0.4 ms
MDC IDC MSMT LEADCHNL RV SENSING INTR AMPL: 12 mV
MDC IDC SET LEADCHNL RV PACING PULSEWIDTH: 0.4 ms
MDC IDC SET LEADCHNL RV SENSING SENSITIVITY: 2 mV

## 2013-10-16 NOTE — Progress Notes (Signed)
HPI [name] returns today for followup. He is a very pleasant 80 year old man with a history of atrial fibrillation, COPD, ongoing tobacco abuse, symptomatic bradycardia, status post permanent pacemaker insertion. He is not thought to be an anticoagulation candidate secondary to gastrointestinal bleeding. Since I saw him last a year ago, he has done well with no chest pain or shortness of breath. He denies peripheral edema. He does admit to a sedentary lifestyle. He has residual swelling in his right leg, secondary to a remote DVT. The patient refuses to wear a support stocking. Allergies  Allergen Reactions  . Albuterol     REACTION: agitation  . Statins   . Sulfamethoxazole     REACTION: unspecified  . Sulfonamide Derivatives      Current Outpatient Prescriptions  Medication Sig Dispense Refill  . alfuzosin (UROXATRAL) 10 MG 24 hr tablet Take 10 mg by mouth daily.        Marland Kitchen aspirin 81 MG tablet Take 81 mg by mouth daily.        Marland Kitchen ezetimibe (ZETIA) 10 MG tablet Take 10 mg by mouth daily.        . furosemide (LASIX) 80 MG tablet Take 1 tablet (80 mg total) by mouth as directed.  60 each  6  . glyBURIDE (DIABETA) 5 MG tablet Take 10 mg by mouth 3 (three) times daily with meals.       . hydrALAZINE (APRESOLINE) 25 MG tablet 25 mg. Take 1/2 tab three times daily      . isosorbide mononitrate (IMDUR) 30 MG 24 hr tablet Take 30 mg by mouth 2 (two) times daily.        Marland Kitchen levalbuterol (XOPENEX HFA) 45 MCG/ACT inhaler Inhale 2 puffs into the lungs every 12 (twelve) hours.      . miglitol (GLYSET) 50 MG tablet Take 50 mg by mouth 3 (three) times daily.        . nitroGLYCERIN (NITROSTAT) 0.4 MG SL tablet Place 0.4 mg under the tongue as needed.         No current facility-administered medications for this visit.     Past Medical History  Diagnosis Date  . Respiratory failure     secondary to pleural effusion, severe COPD, ongoing tobacco abuse, and congestive heart failure)  . Heart block      with sinus pauses (status post St. Jude pacemaker by Dr. Graciela Husbands)  . Chronic renal insufficiency   . Chronic atrial fibrillation     (no Coumadin secondary to GI bleeding)  . Diabetes mellitus   . Coronary artery disease 2009    last catheterization in 2009 demonstrate a 3-vessel CAD . He underwent stenting with a right  coronary artery with a  PROMUS drug-eluting stent. He  also had a stenting of his circumflex with drug-eluting  stent previously))  . Cardiomyopathy     (EF 40%)  . Degenerative joint disease   . Hypertension   . Hyperlipidemia   . Peptic ulcer disease   . Peripheral vascular disease   . Sinoatrial node dysfunction     ROS:   All systems reviewed and negative except as noted in the HPI.   Past Surgical History  Procedure Laterality Date  . Cholecystectomy    . Insert / replace / remove pacemaker      St. Jude     Family History  Problem Relation Age of Onset  . Coronary artery disease Neg Hx      History   Social  History  . Marital Status: Married    Spouse Name: N/A    Number of Children: N/A  . Years of Education: N/A   Occupational History  . retired     Hotel managermilitary   Social History Main Topics  . Smoking status: Current Every Day Smoker -- 1.00 packs/day for 50 years    Types: Cigarettes    Last Attempt to Quit: 06/15/2007  . Smokeless tobacco: Not on file  . Alcohol Use: Not on file  . Drug Use: Not on file  . Sexual Activity: Not on file   Other Topics Concern  . Not on file   Social History Narrative  . No narrative on file     BP 132/54  Pulse 59  Ht 5\' 10"  (1.778 m)  Wt 190 lb (86.183 kg)  BMI 27.26 kg/m2  SpO2 100%  Physical Exam:  Well appearing 80 year old man, NAD HEENT: Unremarkable Neck:  6 cm JVD, no thyromegally, carotids without bruit Lungs:  Clear except for scattered rales in the bases. HEART:  IRegular rate rhythm, no murmurs, no rubs, no clicks Abd:  soft, positive bowel sounds, no organomegally, no  rebound, no guarding Ext:  2 plus pulses, no edema, no cyanosis, no clubbing Skin:  No rashes no nodules Neuro:  CN II through XII intact, motor grossly intact  DEVICE  Normal device function.  See PaceArt for details.   Assess/Plan:

## 2013-10-16 NOTE — Patient Instructions (Signed)
Your physician recommends that you schedule a follow-up appointment in: 6 months with Paula and 12 months with Dr Taylor 

## 2013-10-16 NOTE — Assessment & Plan Note (Signed)
I've encouraged the patient to stop smoking, but he refuses. "It is my only device"

## 2013-10-16 NOTE — Assessment & Plan Note (Signed)
His St. Jude single chamber pacemaker is working normally. He has greater than 10 years of battery longevity.

## 2013-10-16 NOTE — Assessment & Plan Note (Signed)
His ventricular rate remains well controlled. The patient will continue his current medical therapy. He is not a candidate for anticoagulation because of a history of GI bleeding.

## 2013-10-17 ENCOUNTER — Encounter: Payer: Self-pay | Admitting: Internal Medicine

## 2014-04-11 ENCOUNTER — Ambulatory Visit (INDEPENDENT_AMBULATORY_CARE_PROVIDER_SITE_OTHER): Payer: Medicare Other | Admitting: *Deleted

## 2014-04-11 ENCOUNTER — Encounter: Payer: Self-pay | Admitting: Internal Medicine

## 2014-04-11 DIAGNOSIS — I495 Sick sinus syndrome: Secondary | ICD-10-CM

## 2014-04-11 LAB — MDC_IDC_ENUM_SESS_TYPE_INCLINIC
Battery Impedance: 1000 Ohm — CL
Battery Remaining Longevity: 120 mo
Brady Statistic RV Percent Paced: 69 %
Date Time Interrogation Session: 20150710093020
Implantable Pulse Generator Model: 5626
Implantable Pulse Generator Serial Number: 2164677
Lead Channel Pacing Threshold Pulse Width: 0.4 ms
Lead Channel Setting Sensing Sensitivity: 2 mV
MDC IDC MSMT BATTERY VOLTAGE: 2.79 V
MDC IDC MSMT LEADCHNL RV IMPEDANCE VALUE: 418 Ohm
MDC IDC MSMT LEADCHNL RV PACING THRESHOLD AMPLITUDE: 0.5 V
MDC IDC MSMT LEADCHNL RV SENSING INTR AMPL: 12 mV
MDC IDC SET LEADCHNL RV PACING AMPLITUDE: 2.5 V
MDC IDC SET LEADCHNL RV PACING PULSEWIDTH: 0.4 ms

## 2014-04-11 NOTE — Progress Notes (Signed)
Pacemaker check in clinic. Normal device function. Battery longevity > 10 years. Turned off RV autocapture due to out of range readings. ROV in 6 mths w/GT in RDS.

## 2014-04-18 ENCOUNTER — Inpatient Hospital Stay (HOSPITAL_COMMUNITY)
Admission: EM | Admit: 2014-04-18 | Discharge: 2014-04-20 | DRG: 190 | Disposition: A | Discharge status: Home / Self Care | Payer: Medicare Other | Attending: Internal Medicine | Admitting: Internal Medicine

## 2014-04-18 ENCOUNTER — Emergency Department (HOSPITAL_COMMUNITY): Payer: Medicare Other

## 2014-04-18 ENCOUNTER — Encounter (HOSPITAL_COMMUNITY): Payer: Self-pay | Admitting: Emergency Medicine

## 2014-04-18 DIAGNOSIS — J441 Chronic obstructive pulmonary disease with (acute) exacerbation: Secondary | ICD-10-CM | POA: Diagnosis present

## 2014-04-18 DIAGNOSIS — E785 Hyperlipidemia, unspecified: Secondary | ICD-10-CM | POA: Diagnosis present

## 2014-04-18 DIAGNOSIS — I509 Heart failure, unspecified: Secondary | ICD-10-CM | POA: Insufficient documentation

## 2014-04-18 DIAGNOSIS — I739 Peripheral vascular disease, unspecified: Secondary | ICD-10-CM | POA: Diagnosis present

## 2014-04-18 DIAGNOSIS — Z9119 Patient's noncompliance with other medical treatment and regimen: Secondary | ICD-10-CM

## 2014-04-18 DIAGNOSIS — Z950 Presence of cardiac pacemaker: Secondary | ICD-10-CM | POA: Diagnosis present

## 2014-04-18 DIAGNOSIS — Z91199 Patient's noncompliance with other medical treatment and regimen due to unspecified reason: Secondary | ICD-10-CM | POA: Diagnosis not present

## 2014-04-18 DIAGNOSIS — D649 Anemia, unspecified: Secondary | ICD-10-CM | POA: Diagnosis present

## 2014-04-18 DIAGNOSIS — I10 Essential (primary) hypertension: Secondary | ICD-10-CM | POA: Diagnosis present

## 2014-04-18 DIAGNOSIS — E119 Type 2 diabetes mellitus without complications: Secondary | ICD-10-CM | POA: Diagnosis present

## 2014-04-18 DIAGNOSIS — R0602 Shortness of breath: Secondary | ICD-10-CM | POA: Diagnosis present

## 2014-04-18 DIAGNOSIS — I4891 Unspecified atrial fibrillation: Secondary | ICD-10-CM | POA: Diagnosis present

## 2014-04-18 DIAGNOSIS — N189 Chronic kidney disease, unspecified: Secondary | ICD-10-CM | POA: Diagnosis present

## 2014-04-18 DIAGNOSIS — I129 Hypertensive chronic kidney disease with stage 1 through stage 4 chronic kidney disease, or unspecified chronic kidney disease: Secondary | ICD-10-CM | POA: Diagnosis present

## 2014-04-18 DIAGNOSIS — I5043 Acute on chronic combined systolic (congestive) and diastolic (congestive) heart failure: Secondary | ICD-10-CM | POA: Diagnosis present

## 2014-04-18 DIAGNOSIS — I495 Sick sinus syndrome: Secondary | ICD-10-CM | POA: Diagnosis present

## 2014-04-18 DIAGNOSIS — I5023 Acute on chronic systolic (congestive) heart failure: Secondary | ICD-10-CM | POA: Diagnosis present

## 2014-04-18 DIAGNOSIS — Z7982 Long term (current) use of aspirin: Secondary | ICD-10-CM | POA: Diagnosis not present

## 2014-04-18 DIAGNOSIS — I2510 Atherosclerotic heart disease of native coronary artery without angina pectoris: Secondary | ICD-10-CM | POA: Diagnosis present

## 2014-04-18 DIAGNOSIS — I428 Other cardiomyopathies: Secondary | ICD-10-CM | POA: Diagnosis present

## 2014-04-18 DIAGNOSIS — M1990 Unspecified osteoarthritis, unspecified site: Secondary | ICD-10-CM | POA: Diagnosis present

## 2014-04-18 DIAGNOSIS — N179 Acute kidney failure, unspecified: Secondary | ICD-10-CM | POA: Diagnosis present

## 2014-04-18 DIAGNOSIS — Z9861 Coronary angioplasty status: Secondary | ICD-10-CM

## 2014-04-18 DIAGNOSIS — F17200 Nicotine dependence, unspecified, uncomplicated: Secondary | ICD-10-CM | POA: Diagnosis present

## 2014-04-18 DIAGNOSIS — Z66 Do not resuscitate: Secondary | ICD-10-CM | POA: Diagnosis present

## 2014-04-18 LAB — TROPONIN I: Troponin I: <0.3 ng/mL (ref <0.30)

## 2014-04-18 LAB — GLUCOSE, CAPILLARY
Glucose-Capillary: 287 mg/dL — ABNORMAL HIGH (ref 70–99)
Glucose-Capillary: 407 mg/dL — ABNORMAL HIGH (ref 70–99)
Glucose-Capillary: 378 mg/dL — ABNORMAL HIGH (ref 70–99)

## 2014-04-18 LAB — BASIC METABOLIC PANEL
Anion gap: 10 (ref 5–15)
BUN: 27 mg/dL — AB (ref 6–23)
CALCIUM: 9 mg/dL (ref 8.4–10.5)
CHLORIDE: 104 meq/L (ref 96–112)
CO2: 26 mEq/L (ref 19–32)
Creatinine, Ser: 1.76 mg/dL — ABNORMAL HIGH (ref 0.50–1.35)
GFR, EST AFRICAN AMERICAN: 39 mL/min — AB (ref >90)
GFR, EST NON AFRICAN AMERICAN: 34 mL/min — AB (ref >90)
Glucose, Bld: 199 mg/dL — ABNORMAL HIGH (ref 70–99)
Potassium: 4.8 mEq/L (ref 3.7–5.3)
Sodium: 140 mEq/L (ref 137–147)

## 2014-04-18 LAB — CBC WITH DIFFERENTIAL/PLATELET
BASOS ABS: 0 10*3/uL (ref 0.0–0.1)
Basophils Relative: 0 % (ref 0–1)
Eosinophils Absolute: 0.3 10*3/uL (ref 0.0–0.7)
Eosinophils Relative: 4 % (ref 0–5)
HEMATOCRIT: 38.2 % — AB (ref 39.0–52.0)
HEMOGLOBIN: 12.7 g/dL — AB (ref 13.0–17.0)
LYMPHS ABS: 2.2 10*3/uL (ref 0.7–4.0)
LYMPHS PCT: 25 % (ref 12–46)
MCH: 31.6 pg (ref 26.0–34.0)
MCHC: 33.2 g/dL (ref 30.0–36.0)
MCV: 95 fL (ref 78.0–100.0)
MONO ABS: 0.5 10*3/uL (ref 0.1–1.0)
Monocytes Relative: 5 % (ref 3–12)
NEUTROS ABS: 5.5 10*3/uL (ref 1.7–7.7)
Neutrophils Relative %: 66 % (ref 43–77)
Platelets: 156 10*3/uL (ref 150–400)
RBC: 4.02 MIL/uL — AB (ref 4.22–5.81)
RDW: 14.2 % (ref 11.5–15.5)
WBC: 8.5 10*3/uL (ref 4.0–10.5)

## 2014-04-18 LAB — GLUCOSE, RANDOM: Glucose, Bld: 470 mg/dL — ABNORMAL HIGH (ref 70–99)

## 2014-04-18 LAB — PRO B NATRIURETIC PEPTIDE: PRO B NATRI PEPTIDE: 3238 pg/mL — AB (ref 0–450)

## 2014-04-18 MED ORDER — FUROSEMIDE 10 MG/ML IJ SOLN
80.0000 mg | Freq: Once | INTRAMUSCULAR | Status: AC
  Administered 2014-04-18: 80 mg via INTRAVENOUS
  Filled 2014-04-18: qty 8

## 2014-04-18 MED ORDER — SODIUM CHLORIDE 0.9 % IJ SOLN
3.0000 mL | Freq: Two times a day (BID) | INTRAMUSCULAR | Status: DC
  Administered 2014-04-18 – 2014-04-19 (×4): 3 mL via INTRAVENOUS

## 2014-04-18 MED ORDER — ASPIRIN EC 81 MG PO TBEC
81.0000 mg | DELAYED_RELEASE_TABLET | Freq: Every day | ORAL | Status: DC
  Administered 2014-04-19 – 2014-04-20 (×2): 81 mg via ORAL
  Filled 2014-04-18 (×3): qty 1

## 2014-04-18 MED ORDER — FUROSEMIDE 10 MG/ML IJ SOLN
40.0000 mg | Freq: Two times a day (BID) | INTRAMUSCULAR | Status: DC
  Administered 2014-04-19: 40 mg via INTRAVENOUS
  Filled 2014-04-18: qty 4

## 2014-04-18 MED ORDER — INSULIN ASPART 100 UNIT/ML ~~LOC~~ SOLN
0.0000 [IU] | Freq: Three times a day (TID) | SUBCUTANEOUS | Status: DC
Start: 2014-04-18 — End: 2014-04-20
  Administered 2014-04-18: 9 [IU] via SUBCUTANEOUS
  Administered 2014-04-19 (×3): 3 [IU] via SUBCUTANEOUS

## 2014-04-18 MED ORDER — HEPARIN SODIUM (PORCINE) 5000 UNIT/ML IJ SOLN
5000.0000 [IU] | Freq: Three times a day (TID) | INTRAMUSCULAR | Status: DC
  Administered 2014-04-18 – 2014-04-20 (×6): 5000 [IU] via SUBCUTANEOUS
  Filled 2014-04-18 (×6): qty 1

## 2014-04-18 MED ORDER — INSULIN ASPART 100 UNIT/ML ~~LOC~~ SOLN
4.0000 [IU] | Freq: Once | SUBCUTANEOUS | Status: AC
  Administered 2014-04-18: 4 [IU] via SUBCUTANEOUS

## 2014-04-18 MED ORDER — LEVALBUTEROL HCL 0.63 MG/3ML IN NEBU
0.6300 mg | INHALATION_SOLUTION | Freq: Four times a day (QID) | RESPIRATORY_TRACT | Status: DC
  Administered 2014-04-18 – 2014-04-20 (×7): 0.63 mg via RESPIRATORY_TRACT
  Filled 2014-04-18 (×7): qty 3

## 2014-04-18 MED ORDER — NICOTINE 21 MG/24HR TD PT24: 21.0000 mg | MEDICATED_PATCH | Freq: Every day | TRANSDERMAL | Status: DC

## 2014-04-18 MED ORDER — EZETIMIBE 10 MG PO TABS
10.0000 mg | ORAL_TABLET | Freq: Every day | ORAL | Status: DC
  Administered 2014-04-19 – 2014-04-20 (×2): 10 mg via ORAL
  Filled 2014-04-18 (×3): qty 1

## 2014-04-18 MED ORDER — ISOSORBIDE MONONITRATE ER 60 MG PO TB24
30.0000 mg | ORAL_TABLET | Freq: Two times a day (BID) | ORAL | Status: DC
  Administered 2014-04-18 – 2014-04-20 (×4): 30 mg via ORAL
  Filled 2014-04-18 (×4): qty 1

## 2014-04-18 MED ORDER — SODIUM CHLORIDE 0.9 % IV SOLN
INTRAVENOUS | Status: DC
  Administered 2014-04-18: 14:00:00 via INTRAVENOUS

## 2014-04-18 MED ORDER — PREDNISONE 20 MG PO TABS
40.0000 mg | ORAL_TABLET | Freq: Every day | ORAL | Status: DC
  Administered 2014-04-18: 40 mg via ORAL
  Filled 2014-04-18: qty 2

## 2014-04-18 NOTE — ED Provider Notes (Signed)
CSN: [id number]     Arrival date & time 04/18/14  1214 History  This chart was scribed for Vanetta Mulders, MD by Elon Spanner, ED Scribe. This patient was seen in room APA12/APA12 and the patient's care was started at 1:21 PM.    Chief Complaint  Patient presents with  . Shortness of Breath    The history is provided by the patient and a relative. No language interpreter was used.    HPI Comments: [name] is a 80 y.o. male brought in by ambulance with a history of COPD, CAD, chronic renal insufficiency, chronic Afib, HTN, HLD, peripheral vascular disease who presents to the Emergency Department complaining of 3 weeks of constant, gradually worsening SOB. The patient has associated chest pain, leg swelling, and a cough that is non-productive of sputum.  Patient's wife states that he has both inhalers and a nebulizer at home but has not used the nebulizer in greater than 8 months. The patient does, however, continue to use his inhaler BID. Patient was recently started on at home oxygen which he refuses to use. Patient's wife states that the patient is compliant with his Lasix; last dose was 2 days ago. Daughter states patient's SOB is worse with ambulation or any activity. Patient denies CP, fever, chills, rhinorrhea, visual changes, abdominal pain, nausea, vomiting, diarrhea, dysuria, rash, back pain, and HA.  Patient is a current 1.5 ppd smoker.   Patient has a DNR.   Past Medical History  Diagnosis Date  . Respiratory failure     secondary to pleural effusion, severe COPD, ongoing tobacco abuse, and congestive heart failure)  . Heart block     with sinus pauses (status post St. Jude pacemaker by Dr. Graciela Husbands)  . Chronic renal insufficiency   . Chronic atrial fibrillation     (no Coumadin secondary to GI bleeding)  . Diabetes mellitus   . Coronary artery disease 2009    last catheterization in 2009 demonstrate a 3-vessel CAD . He underwent stenting with a right  coronary artery with a   PROMUS drug-eluting stent. He  also had a stenting of his circumflex with drug-eluting  stent previously))  . Cardiomyopathy     (EF 40%)  . Degenerative joint disease   . Hypertension   . Hyperlipidemia   . Peptic ulcer disease   . Peripheral vascular disease   . Sinoatrial node dysfunction    Past Surgical History  Procedure Laterality Date  . Cholecystectomy    . Insert / replace / remove pacemaker      St. Jude   Family History  Problem Relation Age of Onset  . Coronary artery disease Neg Hx    History  Substance Use Topics  . Smoking status: Current Every Day Smoker -- 1.50 packs/day for 60 years    Types: Cigarettes    Last Attempt to Quit: 06/15/2007  . Smokeless tobacco: Not on file  . Alcohol Use: Yes     Comment: occasionally have "a beer"    Review of Systems  Constitutional: Negative for fever and chills.  HENT: Negative for rhinorrhea and sore throat.   Eyes: Negative for visual disturbance.  Respiratory: Positive for cough and shortness of breath.   Cardiovascular: Positive for chest pain.  Gastrointestinal: Negative for nausea, vomiting, abdominal pain and diarrhea.  Genitourinary: Negative for dysuria.  Musculoskeletal: Negative for back pain and neck pain.  Skin: Negative for rash.  Neurological: Negative for headaches.  Hematological: Does not bruise/bleed easily.  Psychiatric/Behavioral:  Negative for confusion.      Allergies  Albuterol; Statins; and Sulfonamide derivatives  Home Medications   Prior to Admission medications   Medication Sig Start Date End Date Taking? Authorizing Provider  aspirin 81 MG tablet Take 81 mg by mouth daily.     Yes Historical Provider, MD  ezetimibe (ZETIA) 10 MG tablet Take 10 mg by mouth daily.     Yes Historical Provider, MD  furosemide (LASIX) 80 MG tablet Take 1 tablet (80 mg total) by mouth as directed. 10/20/11  Yes Rollene Rotunda, MD  glyBURIDE (DIABETA) 5 MG tablet Take 10 mg by mouth 2 (two) times daily  with a meal.    Yes Historical Provider, MD  hydrALAZINE (APRESOLINE) 25 MG tablet 25 mg. Take 1/2 tab three times daily   Yes Historical Provider, MD  isosorbide mononitrate (IMDUR) 30 MG 24 hr tablet Take 30 mg by mouth 2 (two) times daily.     Yes Historical Provider, MD  levalbuterol (XOPENEX HFA) 45 MCG/ACT inhaler Inhale 2 puffs into the lungs every 12 (twelve) hours. 10/14/11  Yes Marinus Maw, MD  miglitol (GLYSET) 50 MG tablet Take 50 mg by mouth 3 (three) times daily.     Yes Historical Provider, MD   BP 160/60  Pulse 64  Temp(Src) 98.5 F (36.9 C) (Oral)  Resp 22  Ht 5\' 10"  (1.778 m)  Wt 185 lb (83.915 kg)  BMI 26.54 kg/m2  SpO2 98% Physical Exam  Nursing note and vitals reviewed. Constitutional: He is oriented to person, place, and time. He appears well-developed and well-nourished. No distress.  HENT:  Head: Normocephalic and atraumatic.  Eyes: Conjunctivae and EOM are normal.  Neck: Neck supple. No tracheal deviation present.  Cardiovascular: Normal rate, regular rhythm and normal heart sounds.   2+ pitting edema BLE.   Pulmonary/Chest: Effort normal and breath sounds normal. No respiratory distress. He has no wheezes. He has no rales.  Room 02 sat 97%  Abdominal: Soft. Bowel sounds are normal. There is no tenderness.  Normal bowel sounds.   Musculoskeletal: Normal range of motion. He exhibits edema. He exhibits no tenderness.  2+ pitting edema BLE.  Able to move hands and feet bilaterally.   Neurological: He is alert and oriented to person, place, and time. No cranial nerve deficit.  Skin: Skin is warm and dry.  Psychiatric: He has a normal mood and affect. His behavior is normal.    ED Course  Procedures (including critical care time)  DIAGNOSTIC STUDIES: Oxygen Saturation is 100% on RA, normal by my interpretation.    COORDINATION OF CARE:  1:27 PM Discussed plan to order labs and imaging.  Patient acknowledged and agrees with plan.    Labs Review Labs  Reviewed  CBC WITH DIFFERENTIAL - Abnormal; Notable for the following:    RBC 4.02 (*)    Hemoglobin 12.7 (*)    HCT 38.2 (*)    All other components within normal limits  BASIC METABOLIC PANEL - Abnormal; Notable for the following:    Glucose, Bld 199 (*)    BUN 27 (*)    Creatinine, Ser 1.76 (*)    GFR calc non Af Amer 34 (*)    GFR calc Af Amer 39 (*)    All other components within normal limits  PRO B NATRIURETIC PEPTIDE - Abnormal; Notable for the following:    Pro B Natriuretic peptide (BNP) 3238.0 (*)    All other components within normal limits  TROPONIN I  Results for orders placed during the hospital encounter of 04/18/14  CBC WITH DIFFERENTIAL      Result Value Ref Range   WBC 8.5  4.0 - 10.5 K/uL   RBC 4.02 (*) 4.22 - 5.81 MIL/uL   Hemoglobin 12.7 (*) 13.0 - 17.0 g/dL   HCT 40.9 (*) 81.1 - 91.4 %   MCV 95.0  78.0 - 100.0 fL   MCH 31.6  26.0 - 34.0 pg   MCHC 33.2  30.0 - 36.0 g/dL   RDW 78.2  95.6 - 21.3 %   Platelets 156  150 - 400 K/uL   Neutrophils Relative % 66  43 - 77 %   Neutro Abs 5.5  1.7 - 7.7 K/uL   Lymphocytes Relative 25  12 - 46 %   Lymphs Abs 2.2  0.7 - 4.0 K/uL   Monocytes Relative 5  3 - 12 %   Monocytes Absolute 0.5  0.1 - 1.0 K/uL   Eosinophils Relative 4  0 - 5 %   Eosinophils Absolute 0.3  0.0 - 0.7 K/uL   Basophils Relative 0  0 - 1 %   Basophils Absolute 0.0  0.0 - 0.1 K/uL  BASIC METABOLIC PANEL      Result Value Ref Range   Sodium 140  137 - 147 mEq/L   Potassium 4.8  3.7 - 5.3 mEq/L   Chloride 104  96 - 112 mEq/L   CO2 26  19 - 32 mEq/L   Glucose, Bld 199 (*) 70 - 99 mg/dL   BUN 27 (*) 6 - 23 mg/dL   Creatinine, Ser 0.86 (*) 0.50 - 1.35 mg/dL   Calcium 9.0  8.4 - 57.8 mg/dL   GFR calc non Af Amer 34 (*) >90 mL/min   GFR calc Af Amer 39 (*) >90 mL/min   Anion gap 10  5 - 15  TROPONIN I      Result Value Ref Range   Troponin I <0.30  <0.30 ng/mL  PRO B NATRIURETIC PEPTIDE      Result Value Ref Range   Pro B Natriuretic  peptide (BNP) 3238.0 (*) 0 - 450 pg/mL     Imaging Review Dg Chest Portable 1 View  04/18/2014   CLINICAL DATA:  Shortness of breath. Worsening for 3 weeks. COPD and CHF.  EXAM: PORTABLE CHEST - 1 VIEW  COMPARISON:  08/12/2009 and 03/10/2009.  FINDINGS: Left chest wall single lead pacer device. Distal tip of the pacer lead projects over the expected location of right ventricle. Stable cardiomegaly. Atherosclerotic calcification thoracic aortic arch. There is diffuse pulmonary vascular congestion and bilateral diffuse interstitial prominence. Both costophrenic angles are blunted. Negative for pneumothorax. For linear atelectasis and/or scarring at the lateral left lung base. Bones appear osteopenic. High-riding humeral heads bilaterally obstructive up disease.  IMPRESSION: Mild congestive heart failure pattern. Suspect small bilateral pleural effusions.  Single lead pacer.   Electronically Signed   By: Britta Mccreedy M.D.   On: 04/18/2014 13:01     EKG Interpretation   Date/Time:  Friday April 18 2014 12:35:38 EDT Ventricular Rate:  69 PR Interval:    QRS Duration: 120 QT Interval:  434 QTC Calculation: 465 R Axis:   -61 Text Interpretation:  Atrial fibrillation Nonspecific IVCD with LAD  Anterior infarct, old Abnormal T, consider ischemia, lateral leads No  significant change was found Confirmed by Manus Gunning  MD, STEPHEN [phone]) on  04/18/2014 12:50:13 PM Also confirmed by Deretha Emory  MD, Auriella Wieand [phone])  on  04/18/2014 1:56:00 PM      MDM   Final diagnoses:  COPD with exacerbation  Congestive heart failure, unspecified congestive heart failure chronicity, unspecified congestive heart failure type  Atrial fibrillation, unspecified    Patient with history of COPD still currently a smoker. Patient also with history of CHF. Patient with gradually worsening shortness of breath over the last 3 weeks. Get very significant today. Patient's wife also noted that denies been having increased swelling  in his hands and legs. Patient has a pacemaker. Patient has a chronic history of atrial fib not on Coumadin due to GI bleeding. Patient's post to be on home oxygen but does not use that machine section been removed. Patient has nebulizer treatments at home not using. Patient has Xopenex inhalers been using that twice a day. EMS gave patient Solu-Medrol albuterol and Atrovent placed on 4 L of oxygen because his sats were below 90%. Patient improved significantly in route no wheezing upon arrival oxygen levels were up in the upper 90s. Oxygen turned off in the emergency department an oxygen level was down to 96% on room air and holding steady. Patient also given 80 mg of Lasix for chest x-ray showing CHF. Patient's b BNP showed evidence of congestive heart failure. Discussed with hospitalist they will admit for fine-tuning of his COPD and adjustment for the CHF. Patient's followed by [facility] cardiology in the past.   I personally performed the services described in this documentation, which was scribed in my presence. The recorded information has been reviewed and is accurate.       Vanetta MuldersScott Erik Burkett, MD 04/18/14 [phone]

## 2014-04-18 NOTE — H&P (Signed)
History and Physical    Ryan GermanJack R Barcelona ZOX:[id number] DOB: 1928-06-27 DOA: 04/18/2014  Referring physician: Dr. Deretha EmoryZackowski PCP: Cassell SmilesFUSCO,LAWRENCE J., MD  Specialists: none   Chief Complaint: shortness of breath  HPI: [name] is a 80 y.o. male has a past medical history significant for COPD, ongoing tobacco abuse, chronic A. fib rate controlled and not on any anticoagulation due to history of GI bleed, peripheral vascular disease, hypertension, hyperlipidemia, chronic systolic and diastolic heart failure with most recent 2-D echo in 2009 which showed an EF of 40%, sinoatrial node dysfunction status post pacemaker,presents to the emergency room with a chief complaint of shortness of breath on and off for the past 3 weeks more so in the last day. He endorses Waking up in the middle of the night short of breath and needing to stand up. He denies any fever or chills. He endorses a chronic cough however has not noticed any worsening without any sputum production more so than his baseline. He denies any chest pain. He endorses bilateral lower extremity swelling and weight gain, cannot quantify how much. Has no GI or GU complaints   Review of system: As per history of present illness, otherwise negative  Past Medical History  Diagnosis Date  . Respiratory failure     secondary to pleural effusion, severe COPD, ongoing tobacco abuse, and congestive heart failure)  . Heart block     with sinus pauses (status post St. Jude pacemaker by Dr. Graciela HusbandsKlein)  . Chronic renal insufficiency   . Chronic atrial fibrillation     (no Coumadin secondary to GI bleeding)  . Diabetes mellitus   . Coronary artery disease 2009    last catheterization in 2009 demonstrate a 3-vessel CAD . He underwent stenting with a right  coronary artery with a  PROMUS drug-eluting stent. He  also had a stenting of his circumflex with drug-eluting  stent previously))  . Cardiomyopathy     (EF 40%)  . Degenerative joint disease   .  Hypertension   . Hyperlipidemia   . Peptic ulcer disease   . Peripheral vascular disease   . Sinoatrial node dysfunction    Past Surgical History  Procedure Laterality Date  . Cholecystectomy    . Insert / replace / remove pacemaker      St. Jude   Social History:  reports that he has been smoking Cigarettes.  He has a 90 pack-year smoking history. He does not have any smokeless tobacco history on file. He reports that he drinks alcohol. His drug history is not on file.  Allergies  Allergen Reactions  . Albuterol     REACTION: agitation  . Statins   . Sulfonamide Derivatives Other (See Comments)    unknown    Family History  Problem Relation Age of Onset  . Coronary artery disease Neg Hx     Prior to Admission medications   Medication Sig Start Date End Date Taking? Authorizing Provider  aspirin 81 MG tablet Take 81 mg by mouth daily.     Yes Historical Provider, MD  ezetimibe (ZETIA) 10 MG tablet Take 10 mg by mouth daily.     Yes Historical Provider, MD  furosemide (LASIX) 80 MG tablet Take 1 tablet (80 mg total) by mouth as directed. 10/20/11  Yes Rollene RotundaJames Hochrein, MD  glyBURIDE (DIABETA) 5 MG tablet Take 10 mg by mouth 2 (two) times daily with a meal.    Yes Historical Provider, MD  hydrALAZINE (APRESOLINE) 25 MG tablet  25 mg. Take 1/2 tab three times daily   Yes Historical Provider, MD  isosorbide mononitrate (IMDUR) 30 MG 24 hr tablet Take 30 mg by mouth 2 (two) times daily.     Yes Historical Provider, MD  levalbuterol (XOPENEX HFA) 45 MCG/ACT inhaler Inhale 2 puffs into the lungs every 12 (twelve) hours. 10/14/11  Yes Marinus Maw, MD  miglitol (GLYSET) 50 MG tablet Take 50 mg by mouth 3 (three) times daily.     Yes Historical Provider, MD   Physical Exam: Filed Vitals:   04/18/14 1218 04/18/14 1300 04/18/14 1330 04/18/14 1400  BP: 161/65 153/77 172/68 160/60  Pulse: 67 63 73 64  Temp: 98.5 F (36.9 C)     TempSrc: Oral     Resp: 26 25 20 22   Height: 5\' 10"   (1.778 m)     Weight: 83.915 kg (185 lb)     SpO2: 100% 100% 98% 98%     General:  Pleasant Caucasian gentleman in no apparent distress  Eyes: no scleral icterus  ENT: moist oropharynx  Neck: supple, JVD present   Cardiovascular:  irregular   Respiratory: Bilateral lower fields crackles, mild wheezing   Abdomen: soft, non tender to palpation, positive bowel sounds, no guarding, no rebound  Skin: no rashes  Musculoskeletal: 2+ pitting peripheral edema   Psychiatric: normal mood and affect  Neurologic:  grossly nonfocal   Labs on Admission:  Basic Metabolic Panel:  Recent Labs Lab 04/18/14 1250  NA 140  K 4.8  CL 104  CO2 26  GLUCOSE 199*  BUN 27*  CREATININE 1.76*  CALCIUM 9.0   Liver Function Tests: No results found for this basename: AST, ALT, ALKPHOS, BILITOT, PROT, ALBUMIN,  in the last 168 hours No results found for this basename: LIPASE, AMYLASE,  in the last 168 hours No results found for this basename: AMMONIA,  in the last 168 hours CBC:  Recent Labs Lab 04/18/14 1250  WBC 8.5  NEUTROABS 5.5  HGB 12.7*  HCT 38.2*  MCV 95.0  PLT 156   Cardiac Enzymes:  Recent Labs Lab 04/18/14 1342  TROPONINI <0.30    BNP (last 3 results)  Recent Labs  04/18/14 1342  PROBNP 3238.0*   CBG: No results found for this basename: GLUCAP,  in the last 168 hours  Radiological Exams on Admission: Dg Chest Portable 1 View  04/18/2014   CLINICAL DATA:  Shortness of breath. Worsening for 3 weeks. COPD and CHF.  EXAM: PORTABLE CHEST - 1 VIEW  COMPARISON:  08/12/2009 and 03/10/2009.  FINDINGS: Left chest wall single lead pacer device. Distal tip of the pacer lead projects over the expected location of right ventricle. Stable cardiomegaly. Atherosclerotic calcification thoracic aortic arch. There is diffuse pulmonary vascular congestion and bilateral diffuse interstitial prominence. Both costophrenic angles are blunted. Negative for pneumothorax. For linear  atelectasis and/or scarring at the lateral left lung base. Bones appear osteopenic. High-riding humeral heads bilaterally obstructive up disease.  IMPRESSION: Mild congestive heart failure pattern. Suspect small bilateral pleural effusions.  Single lead pacer.   Electronically Signed   By: Britta Mccreedy M.D.   On: 04/18/2014 13:01    EKG: Independently reviewed.  Assessment/Plan Principal Problem:   COPD exacerbation Active Problems:   DIABETES MELLITUS   HYPERLIPIDEMIA   ANEMIA, NORMOCYTIC   TOBACCO USER   HYPERTENSION   CAD   Atrial fibrillation   Cardiac pacemaker in situ   Sinoatrial node dysfunction   Acute on chronic systolic heart  failure   Shortness of breath  - likely a combination of COPD/CHF. Oxygen as needed  COPD exacerbation - with wheezing. No different antibiotics without fever, leukocytosis, directed production. We'll provide prednisone and breathing treatments CHF exacerbation - with elevated BNP, chest x-ray consistent with fluid overload, Loris remedy edema, JVD. Status post IV Lasix in the emergency room, we'll continue that. Strict I.'s and O.'s, daily weights. Patient is not on an ACE inhibitor because of renal failure. Atrial fibrillation - he seems to be rate controlled, not on any AV blocking agents. Not on anticoagulation due to the history of GI bleed Tobacco abuse - unfortunately he continues to smoke. Nicotine patch provided. SA node dysfunction - status post pacemaker. Monitor on telemetry. Acute on chronic renal failure - ? Cardiorenal versus long-standing diabetes. Obtain 2-D echo, diuresis, repeat BNP in the morning Diabetes mellitus - SSI  Diet: Heart  Fluids: none  DVT Prophylaxis: Heparin  Code Status:  DO NOT RESUSCITATE   Family Communication: Discussed with wife and daughter Meriam Sprague [phone]) at bedside   Disposition Plan:  admit to inpatient  Time spent:  49   This note has been created with Transport planner. Any transcriptional errors are unintentional.   Marybeth Dandy M. Elvera Lennox, MD [facility] Pager [phone]  If 7PM-7AM, please contact night-coverage [email or web address] Password [facility] 04/18/2014, 3:25 PM

## 2014-04-18 NOTE — ED Notes (Signed)
Patient arrives via EMS from home with c/o worsening shortness of breath x 3 weeks. Heavy smoke smell, patient states smoking this AM prior to calling EMS. Patient smokes 1.5 ppd x 60 years. EMS reports fire department had patient on 4 liters nasal canula upon their arrival, patient with noted wheezing and labored breathing. 1 albuterol, 1 combivent, 125 mg solumedrol given in route to ED. Patient still wheezing. Combivent still being administered. Patient denies pain complaints.

## 2014-04-18 NOTE — ED Notes (Signed)
Report given to [facility], RN unit 300. Ready to receive patient.

## 2014-04-18 NOTE — Progress Notes (Signed)
Late entry 1850 Patient's CBG 407.  Patient had eaten evening meal.  Had not received prior insulin coverage.  Dr. Elvera Lennox notified.  Gave order for patient to receive 9 units and to recheck as scheduled at bedtime.

## 2014-04-19 DIAGNOSIS — I059 Rheumatic mitral valve disease, unspecified: Secondary | ICD-10-CM

## 2014-04-19 LAB — COMPREHENSIVE METABOLIC PANEL
ALT: 18 U/L (ref 0–53)
AST: 23 U/L (ref 0–37)
Albumin: 3.9 g/dL (ref 3.5–5.2)
Alkaline Phosphatase: 86 U/L (ref 39–117)
Anion gap: 14 (ref 5–15)
BILIRUBIN TOTAL: 0.5 mg/dL (ref 0.3–1.2)
BUN: 33 mg/dL — ABNORMAL HIGH (ref 6–23)
CO2: 25 meq/L (ref 19–32)
CREATININE: 1.81 mg/dL — AB (ref 0.50–1.35)
Calcium: 9.3 mg/dL (ref 8.4–10.5)
Chloride: 96 mEq/L (ref 96–112)
GFR calc Af Amer: 38 mL/min — ABNORMAL LOW (ref >90)
GFR calc non Af Amer: 33 mL/min — ABNORMAL LOW (ref >90)
Glucose, Bld: 254 mg/dL — ABNORMAL HIGH (ref 70–99)
Potassium: 4.6 mEq/L (ref 3.7–5.3)
Sodium: 135 mEq/L — ABNORMAL LOW (ref 137–147)
Total Protein: 7 g/dL (ref 6.0–8.3)

## 2014-04-19 LAB — CBC
HCT: 38.7 % — ABNORMAL LOW (ref 39.0–52.0)
Hemoglobin: 12.9 g/dL — ABNORMAL LOW (ref 13.0–17.0)
MCH: 31.2 pg (ref 26.0–34.0)
MCHC: 33.3 g/dL (ref 30.0–36.0)
MCV: 93.7 fL (ref 78.0–100.0)
PLATELETS: 180 10*3/uL (ref 150–400)
RBC: 4.13 MIL/uL — ABNORMAL LOW (ref 4.22–5.81)
RDW: 14 % (ref 11.5–15.5)
WBC: 8.7 10*3/uL (ref 4.0–10.5)

## 2014-04-19 LAB — GLUCOSE, CAPILLARY
GLUCOSE-CAPILLARY: 228 mg/dL — AB (ref 70–99)
Glucose-Capillary: 233 mg/dL — ABNORMAL HIGH (ref 70–99)
Glucose-Capillary: 207 mg/dL — ABNORMAL HIGH (ref 70–99)
Glucose-Capillary: 247 mg/dL — ABNORMAL HIGH (ref 70–99)

## 2014-04-19 LAB — HEMOGLOBIN A1C
Hgb A1c MFr Bld: 6.8 % — ABNORMAL HIGH (ref <5.7)
MEAN PLASMA GLUCOSE: 148 mg/dL — AB (ref <117)

## 2014-04-19 MED ORDER — PREDNISONE 20 MG PO TABS
20.0000 mg | ORAL_TABLET | Freq: Every day | ORAL | Status: DC
  Administered 2014-04-19 – 2014-04-20 (×2): 20 mg via ORAL
  Filled 2014-04-19 (×2): qty 1

## 2014-04-19 MED ORDER — BIOTENE DRY MOUTH MT LIQD
15.0000 mL | Freq: Two times a day (BID) | OROMUCOSAL | Status: DC
Start: 2014-04-19 — End: 2014-04-20
  Administered 2014-04-19 (×2): 15 mL via OROMUCOSAL

## 2014-04-19 NOTE — Progress Notes (Signed)
PROGRESS NOTE  [name] UJW:[id number] DOB: 1927/12/31 DOA: 04/18/2014 PCP: Cassell SmilesFUSCO,LAWRENCE J., MD  Subjective/ 24 H Interval events - better this morning, on room air  Assessment/Plan: Shortness of breath - likely a combination of COPD/CHF. Oxygen as needed  COPD exacerbation - with wheezing, resolved this morning - continue steroids and nebs CHF exacerbation  - swelling improved Atrial fibrillation - he seems to be rate controlled, not on any AV blocking agents. Not on anticoagulation due to the history of GI bleed  Tobacco abuse - unfortunately he continues to smoke. Nicotine patch provided.  SA node dysfunction - status post pacemaker. Monitor on telemetry.  Acute on chronic renal failure - stable renal function today, hold Lasix Diabetes mellitus - SSI   Diet: heart Fluids: none  DVT Prophylaxis: heparin  Code Status: DNR Family Communication: d/w wife and daughter  Disposition Plan: home when ready   Consultants:  none  Procedures:  2d echo   Antibiotics - none   Studies  Filed Vitals:   04/19/14 0735 04/19/14 1120 04/19/14 1123 04/19/14 1424  BP:      Pulse:      Temp:      TempSrc:      Resp:      Height:      Weight:      SpO2: 96% 99% 94% 63%    Intake/Output Summary (Last 24 hours) at 04/19/14 1553 Last data filed at 04/19/14 0820  Gross per 24 hour  Intake    240 ml  Output   2100 ml  Net  -1860 ml   Filed Weights   04/18/14 1218 04/18/14 1551 04/19/14 0451  Weight: 83.915 kg (185 lb) 84.369 kg (186 lb) 81.784 kg (180 lb 4.8 oz)    Exam:  General:  NAD  Cardiovascular: RRR  Respiratory: moves air well, diminished overall  Abdomen: soft, NTTP  MSK: 1+ edema  Data Reviewed: Basic Metabolic Panel:  Recent Labs Lab 04/18/14 1250 04/18/14 1949 04/19/14 0552  NA 140  --  135*  K 4.8  --  4.6  CL 104  --  96  CO2 26  --  25  GLUCOSE 199* 470* 254*  BUN 27*  --  33*  CREATININE 1.76*  --  1.81*  CALCIUM 9.0  --   9.3   Liver Function Tests:  Recent Labs Lab 04/19/14 0552  AST 23  ALT 18  ALKPHOS 86  BILITOT 0.5  PROT 7.0  ALBUMIN 3.9   CBC:  Recent Labs Lab 04/18/14 1250 04/19/14 0552  WBC 8.5 8.7  NEUTROABS 5.5  --   HGB 12.7* 12.9*  HCT 38.2* 38.7*  MCV 95.0 93.7  PLT 156 180   Cardiac Enzymes:  Recent Labs Lab 04/18/14 1342  TROPONINI <0.30   BNP (last 3 results)  Recent Labs  04/18/14 1342  PROBNP 3238.0*   CBG:  Recent Labs Lab 04/18/14 1658 04/18/14 1845 04/18/14 2138 04/19/14 0803 04/19/14 1139  GLUCAP 287* 407* 378* 228* 233*   Studies: Dg Chest Portable 1 View  04/18/2014   CLINICAL DATA:  Shortness of breath. Worsening for 3 weeks. COPD and CHF.  EXAM: PORTABLE CHEST - 1 VIEW  COMPARISON:  08/12/2009 and 03/10/2009.  FINDINGS: Left chest wall single lead pacer device. Distal tip of the pacer lead projects over the expected location of right ventricle. Stable cardiomegaly. Atherosclerotic calcification thoracic aortic arch. There is diffuse pulmonary vascular congestion and bilateral diffuse interstitial prominence. Both costophrenic angles are blunted.  Negative for pneumothorax. For linear atelectasis and/or scarring at the lateral left lung base. Bones appear osteopenic. High-riding humeral heads bilaterally obstructive up disease.  IMPRESSION: Mild congestive heart failure pattern. Suspect small bilateral pleural effusions.  Single lead pacer.   Electronically Signed   By: Britta Mccreedy M.D.   On: 04/18/2014 13:01    Scheduled Meds: . antiseptic oral rinse  15 mL Mouth Rinse BID  . aspirin EC  81 mg Oral Daily  . ezetimibe  10 mg Oral Daily  . heparin  5,000 Units Subcutaneous 3 times per day  . insulin aspart  0-9 Units Subcutaneous TID WC  . isosorbide mononitrate  30 mg Oral BID  . levalbuterol  0.63 mg Nebulization Q6H  . nicotine  21 mg Transdermal Daily  . predniSONE  20 mg Oral Q breakfast  . sodium chloride  3 mL Intravenous Q12H    Continuous Infusions: . sodium chloride 10 mL/hr at 04/18/14 1405    Principal Problem:   COPD exacerbation Active Problems:   DIABETES MELLITUS   HYPERLIPIDEMIA   ANEMIA, NORMOCYTIC   TOBACCO USER   HYPERTENSION   CAD   Atrial fibrillation   Cardiac pacemaker in situ   Sinoatrial node dysfunction   Acute on chronic systolic heart failure   Time spent: 25  This note has been created with Education officer, environmental. Any transcriptional errors are unintentional.   Pamella Pert, MD [facility] Pager [phone]. If 7 PM - 7 AM, please contact night-coverage at [email or web address], password [facility] 04/19/2014, 3:53 PM  LOS: 1 day

## 2014-04-19 NOTE — Progress Notes (Signed)
Patient has a blood sugar of 378. Contacted midlevel provider to notify. Order received for 4 units of regular insulin. Will continue to monitor.

## 2014-04-19 NOTE — Progress Notes (Signed)
Pt ambulated in hallway with front wheel walker and standby assist from NT. Pt tolerated well. Prior to walking, pt's O2 saturation checked on RA--99% at rest on room air.  Pt's O2 saturation ambulating on room air--94-96%. Pt tolerated well.

## 2014-04-19 NOTE — Progress Notes (Signed)
Utilization review Completed Judas Mohammad RN BSN   

## 2014-04-19 NOTE — Progress Notes (Signed)
Echocardiogram 2D Echocardiogram has been performed.  [name] 04/19/2014, 9:15 AM

## 2014-04-20 LAB — BASIC METABOLIC PANEL
Anion gap: 10 (ref 5–15)
BUN: 44 mg/dL — ABNORMAL HIGH (ref 6–23)
CALCIUM: 8.9 mg/dL (ref 8.4–10.5)
CO2: 25 mEq/L (ref 19–32)
Chloride: 100 mEq/L (ref 96–112)
Creatinine, Ser: 1.67 mg/dL — ABNORMAL HIGH (ref 0.50–1.35)
GFR calc non Af Amer: 36 mL/min — ABNORMAL LOW (ref >90)
GFR, EST AFRICAN AMERICAN: 42 mL/min — AB (ref >90)
Glucose, Bld: 119 mg/dL — ABNORMAL HIGH (ref 70–99)
Potassium: 4.3 mEq/L (ref 3.7–5.3)
Sodium: 135 mEq/L — ABNORMAL LOW (ref 137–147)

## 2014-04-20 LAB — GLUCOSE, CAPILLARY: Glucose-Capillary: 84 mg/dL (ref 70–99)

## 2014-04-20 MED ORDER — NICOTINE 21 MG/24HR TD PT24: 21.0000 mg | MEDICATED_PATCH | Freq: Every day | TRANSDERMAL | Status: DC

## 2014-04-20 MED ORDER — PREDNISONE 20 MG PO TABS: 20.0000 mg | ORAL_TABLET | Freq: Every day | ORAL | Status: DC

## 2014-04-20 NOTE — Discharge Summary (Signed)
Physician Discharge Summary  [name]:[id number] DOB: 12/20/1927 DOA: 04/18/2014  PCP: [name]., MD  Admit date: 04/18/2014 Discharge date: 04/20/2014  Time spent: 35 minutes  Recommendations for Outpatient Follow-up:  1. Follow up with PCP in 1 week   Recommendations for primary care physician for things to follow:  Repeat BMP  Discharge Diagnoses:  Principal Problem:   COPD exacerbation Active Problems:   DIABETES MELLITUS   HYPERLIPIDEMIA   ANEMIA, NORMOCYTIC   TOBACCO USER   HYPERTENSION   CAD   Atrial fibrillation   Cardiac pacemaker in situ   Sinoatrial node dysfunction   Acute on chronic systolic heart failure  Discharge Condition: stable  Diet recommendation: low salt, heart healthy  Filed Weights   04/18/14 1551 04/19/14 0451 04/20/14 0657  Weight: 84.369 kg (186 lb) 81.784 kg (180 lb 4.8 oz) 80.876 kg (178 lb 4.8 oz)   History of present illness:  [name] is a 80 y.o. male has a past medical history significant for COPD, ongoing tobacco abuse, chronic A. fib rate controlled and not on any anticoagulation due to history of GI bleed, peripheral vascular disease, hypertension, hyperlipidemia, chronic systolic and diastolic heart failure with most recent 2-D echo in 2009 which showed an EF of 40%, sinoatrial node dysfunction status post pacemaker,presents to the emergency room with a chief complaint of shortness of breath on and off for the past 3 weeks more so in the last day. He endorses Waking up in the middle of the night short of breath and needing to stand up. He denies any fever or chills. He endorses a chronic cough however has not noticed any worsening without any sputum production more so than his baseline. He denies any chest pain. He endorses bilateral lower extremity swelling and weight gain, cannot quantify how much. Has no GI or GU complaints  Hospital Course:  Patient admitted with dyspnea due to a combination of COPD  exacerbation and acute on chronic heart failure. Patient was started on prednisone, breathing treatments and no antibiotics given lack of purulent sputum production, fevers or leukocytosis. He received one dose of Lasix on admission with excellent urine output and was net negative 2.7 L. He has a degree of renal failure and his creatinine initially worsened with diuresis, his Lasix was held for a day. His urine output remained good and his creatinine improved on discharge. His respiratory status and LE swelling improved significantly and patient was able to ambulate in the hallway on room air without dyspnea or chest pain. He is to resume his oral diuretic on discharge and advised PCP follow up next week for BMP recheck. He will go home on a short Prednisone course. Unfortunately patient continues to smoke and have dietary non compliance. He was extensively counseled however he does not seem willing to change his lifestyle.   Procedures:  2D echo Study Conclusions - Left ventricle: The cavity size was normal. There was focal basal hypertrophy. The endocardium is poorly visualized, consider contrast study for better evaluaton of systolic function. From available images appears low normal to mildly to reduced, range 40-50%. Consider contrast study. - Aortic valve: Mildly to moderately calcified annulus. Trileaflet; moderately thickened leaflets. Valve area (VTI): 2.11 cm^2. Valve area (Vmax): 1.77 cm^2. - Mitral valve: Mildly to moderately calcified annulus. Mildly thickened leaflets . There was mild regurgitation. - Left atrium: The atrium was severely dilated. - Right ventricle: Not well visualized. Grossly the RV appears mildly enlarged with mild decrease in  systolic function. Technically difficult TAPSE acquisition, 1.4 cm suggests mild RV systolic dysfunction. - Right atrium: The atrium was mildly to moderately dilated. - Atrial septum: No defect or patent foramen ovale was identified. - Systemic  veins: There is a linear structure noted in the RA that appears to enter the IVC, morphologically looks like a pacer lead. May be reverberation artifact from portion of lead in RA.   Consultations:  None   Discharge Exam: Filed Vitals:   04/19/14 2100 04/20/14 0657 04/20/14 0703 04/20/14 0704  BP: 138/58 127/46    Pulse: 59 60    Temp: 97.8 F (36.6 C) 97.7 F (36.5 C)    TempSrc: Oral Oral    Resp: 20 20    Height:      Weight:  80.876 kg (178 lb 4.8 oz)    SpO2: 100% 100% 97% 99%   General: NAD Cardiovascular: RRR Respiratory: coarse breath sounds, no wheezing,   Discharge Instructions     Medication List         aspirin 81 MG tablet  Take 81 mg by mouth daily.     ezetimibe 10 MG tablet  Commonly known as:  ZETIA  Take 10 mg by mouth daily.     furosemide 80 MG tablet  Commonly known as:  LASIX  Take 1 tablet (80 mg total) by mouth as directed.     glyBURIDE 5 MG tablet  Commonly known as:  DIABETA  Take 10 mg by mouth 2 (two) times daily with a meal.     hydrALAZINE 25 MG tablet  Commonly known as:  APRESOLINE  25 mg. Take 1/2 tab three times daily     isosorbide mononitrate 30 MG 24 hr tablet  Commonly known as:  IMDUR  Take 30 mg by mouth 2 (two) times daily.     levalbuterol 45 MCG/ACT inhaler  Commonly known as:  XOPENEX HFA  Inhale 2 puffs into the lungs every 12 (twelve) hours.     miglitol 50 MG tablet  Commonly known as:  GLYSET  Take 50 mg by mouth 3 (three) times daily.     nicotine 21 mg/24hr patch  Commonly known as:  NICODERM CQ - dosed in mg/24 hours  Place 1 patch (21 mg total) onto the skin daily.     predniSONE 20 MG tablet  Commonly known as:  DELTASONE  Take 1 tablet (20 mg total) by mouth daily with breakfast.           Follow-up Information   Follow up with Cassell SmilesFUSCO,LAWRENCE J., MD. Schedule an appointment as soon as possible for a visit in 1 week.   Specialty:  Internal Medicine   Contact information:   [address] [place] [address] [phone]      The results of significant diagnostics from this hospitalization (including imaging, microbiology, ancillary and laboratory) are listed below for reference.    Significant Diagnostic Studies: Dg Chest Portable 1 View  04/18/2014   CLINICAL DATA:  Shortness of breath. Worsening for 3 weeks. COPD and CHF.  EXAM: PORTABLE CHEST - 1 VIEW  COMPARISON:  08/12/2009 and 03/10/2009.  FINDINGS: Left chest wall single lead pacer device. Distal tip of the pacer lead projects over the expected location of right ventricle. Stable cardiomegaly. Atherosclerotic calcification thoracic aortic arch. There is diffuse pulmonary vascular congestion and bilateral diffuse interstitial prominence. Both costophrenic angles are blunted. Negative for pneumothorax. For linear atelectasis and/or scarring at the lateral left lung base. Bones appear  osteopenic. High-riding humeral heads bilaterally obstructive up disease.  IMPRESSION: Mild congestive heart failure pattern. Suspect small bilateral pleural effusions.  Single lead pacer.   Electronically Signed   By: Britta Mccreedy M.D.   On: 04/18/2014 13:01   Labs: Basic Metabolic Panel:  Recent Labs Lab 04/18/14 1250 04/18/14 1949 04/19/14 0552 04/20/14 0603  NA 140  --  135* 135*  K 4.8  --  4.6 4.3  CL 104  --  96 100  CO2 26  --  25 25  GLUCOSE 199* 470* 254* 119*  BUN 27*  --  33* 44*  CREATININE 1.76*  --  1.81* 1.67*  CALCIUM 9.0  --  9.3 8.9   Liver Function Tests:  Recent Labs Lab 04/19/14 0552  AST 23  ALT 18  ALKPHOS 86  BILITOT 0.5  PROT 7.0  ALBUMIN 3.9   CBC:  Recent Labs Lab 04/18/14 1250 04/19/14 0552  WBC 8.5 8.7  NEUTROABS 5.5  --   HGB 12.7* 12.9*  HCT 38.2* 38.7*  MCV 95.0 93.7  PLT 156 180   Cardiac Enzymes:  Recent Labs Lab 04/18/14 1342  TROPONINI <0.30   BNP: BNP (last 3 results)  Recent Labs  04/18/14 1342  PROBNP 3238.0*   CBG:  Recent Labs Lab  04/19/14 0803 04/19/14 1139 04/19/14 1712 04/19/14 2222 04/20/14 0731  GLUCAP 228* 233* 207* 247* 84   Signed:  Gervis Gaba  [facility] 04/20/2014, 2:56 PM

## 2014-04-20 NOTE — Progress Notes (Signed)
Pt discharged home today per Dr. Elvera Lennox. Pt's IV site D/C'd and WNL. Pt's VSS. Pt and pt's wife provided with home medication list, discharge instructions and prescriptions. Verbalized understanding. Pt left floor via WC in stable condition accompanied by family and NT.

## 2014-04-20 NOTE — Discharge Instructions (Signed)

## 2014-07-30 ENCOUNTER — Encounter: Payer: Self-pay | Admitting: Cardiology

## 2014-07-30 ENCOUNTER — Ambulatory Visit (INDEPENDENT_AMBULATORY_CARE_PROVIDER_SITE_OTHER): Payer: Medicare Other | Admitting: Cardiology

## 2014-07-30 VITALS — BP 117/56 | HR 63 | Ht 70.0 in | Wt 184.0 lb

## 2014-07-30 DIAGNOSIS — I10 Essential (primary) hypertension: Secondary | ICD-10-CM

## 2014-07-30 DIAGNOSIS — R011 Cardiac murmur, unspecified: Secondary | ICD-10-CM

## 2014-07-30 DIAGNOSIS — I209 Angina pectoris, unspecified: Secondary | ICD-10-CM

## 2014-07-30 DIAGNOSIS — I25119 Atherosclerotic heart disease of native coronary artery with unspecified angina pectoris: Secondary | ICD-10-CM

## 2014-07-30 DIAGNOSIS — I4819 Other persistent atrial fibrillation: Secondary | ICD-10-CM

## 2014-07-30 DIAGNOSIS — I481 Persistent atrial fibrillation: Secondary | ICD-10-CM

## 2014-07-30 NOTE — Progress Notes (Signed)
HPI The patient presents followup of his CAD.  Since I last saw him he has had no cardiovascular problems. He said no new dyspnea, PND or orthopnea. He continues to smoke cigarettes.  His chronic mild lower extremity swelling is mild. He is very limited by joint pains. He gets around a little bit in his house.  He denies any chest pressure, neck or arm discomfort. He doesn't notice his fibrillation and has had no presyncope or syncope.  Allergies  Allergen Reactions  . Albuterol     REACTION: agitation  . Statins   . Sulfonamide Derivatives Other (See Comments)    unknown    Current Outpatient Prescriptions  Medication Sig Dispense Refill  . aspirin 81 MG tablet Take 81 mg by mouth daily.        Marland Kitchen. ezetimibe (ZETIA) 10 MG tablet Take 10 mg by mouth daily.        . furosemide (LASIX) 80 MG tablet Take 1 tablet (80 mg total) by mouth as directed.  60 each  6  . glyBURIDE (DIABETA) 5 MG tablet Take 10 mg by mouth 2 (two) times daily with a meal.       . hydrALAZINE (APRESOLINE) 25 MG tablet 25 mg. Take 1/2 tab three times daily      . isosorbide mononitrate (IMDUR) 30 MG 24 hr tablet Take 30 mg by mouth 2 (two) times daily.        Marland Kitchen. levalbuterol (XOPENEX HFA) 45 MCG/ACT inhaler Inhale 2 puffs into the lungs every 6 (six) hours as needed.       . miglitol (GLYSET) 50 MG tablet Take 50 mg by mouth 3 (three) times daily.         No current facility-administered medications for this visit.    Past Medical History  Diagnosis Date  . Respiratory failure     secondary to pleural effusion, severe COPD, ongoing tobacco abuse, and congestive heart failure)  . Heart block     with sinus pauses (status post St. Jude pacemaker by Dr. Graciela HusbandsKlein)  . Chronic renal insufficiency   . Chronic atrial fibrillation     (no Coumadin secondary to GI bleeding)  . Diabetes mellitus   . Coronary artery disease 2009    last catheterization in 2009 demonstrate a 3-vessel CAD . He underwent stenting with a right   coronary artery with a  PROMUS drug-eluting stent. He  also had a stenting of his circumflex with drug-eluting  stent previously))  . Cardiomyopathy     (EF 40%)  . Degenerative joint disease   . Hypertension   . Hyperlipidemia   . Peptic ulcer disease   . Peripheral vascular disease   . Sinoatrial node dysfunction     Past Surgical History  Procedure Laterality Date  . Cholecystectomy    . Insert / replace / remove pacemaker      St. Jude    ROS:  As stated in the HPI and negative for all other systems.  PHYSICAL EXAM BP 117/56  Pulse 63  Ht 5\' 10"  (1.778 m)  Wt 184 lb (83.462 kg)  BMI 26.40 kg/m2 GENERAL:  Chronically ill appearing, no acute distress HEENT:  Pupils equal round and reactive, fundi not visualized, oral mucosa unremarkable, edentulous NECK:  No jugular venous distention at 90 degrees, waveform within normal limits, carotid upstroke brisk and symmetric, right bruits, no thyromegaly LYMPHATICS:  No cervical, inguinal adenopathy LUNGS:  Upper expiratory wheezing CHEST:  Unremarkable, pacemaker in place HEART:  PMI not displaced or sustained,S1 and S2 within normal limits, no S3, no clicks, no rubs, no murmurs, distant heart sounds. ABD:  Flat, positive bowel sounds normal in frequency in pitch, no bruits, no rebound, no guarding, no midline pulsatile mass, no hepatomegaly, no splenomegaly EXT:  2 plus pulses upper absent DP/PT, mild ankle edema, no cyanosis no clubbing SKIN:  No rashes no nodules   EKG: Atrial fibrillation, ventricular pacing rate 63, no acute ST-T wave changes, left axis deviation, left anterior fascicular block, nonspecific diffuse T wave changes. No change from previous.  07/30/2014  ASSESSMENT AND PLAN  ATRIAL FIBRILLATION:  He tolerates this rhythm.  With the past bleeding history and also his desire not to take anticoagulation we are not considering this. No change in therapy is indicated.  CAD:  The patient has no new sypmtoms.  No  further cardiovascular testing is indicated.  We will continue with aggressive risk reduction and meds as listed.  TOBACCO ABUSE:  He has no intention of quitting smoking.  BRUIT:  The patient will have carotid Dopplers  PACEMAKER:  We will make sure that he is up to date with followup.

## 2014-07-30 NOTE — Patient Instructions (Addendum)
The current medical regimen is effective;  continue present plan and medications.  Your physician has requested that you have a carotid duplex.  This will be done at [facility] on Monday, November 2 at 11:45 am.  Check in at Radiology.  This test is an ultrasound of the carotid arteries in your neck. It looks at blood flow through these arteries that supply the brain with blood. Allow one hour for this exam. There are no restrictions or special instructions.  Follow up in 1 year with Dr. Antoine Poche in [place].  You will receive a letter in the mail 2 months before you are due.  Please call us when you receive this letter to schedule your follow up appointment.

## 2014-08-04 ENCOUNTER — Telehealth: Payer: Self-pay | Admitting: Cardiology

## 2014-08-04 ENCOUNTER — Ambulatory Visit (HOSPITAL_COMMUNITY)
Admission: RE | Admit: 2014-08-04 | Discharge: 2014-08-04 | Disposition: A | Discharge status: Home / Self Care | Payer: Medicare Other | Source: Ambulatory Visit | Attending: Cardiology | Admitting: Cardiology

## 2014-08-04 DIAGNOSIS — R011 Cardiac murmur, unspecified: Secondary | ICD-10-CM | POA: Insufficient documentation

## 2014-08-04 NOTE — Telephone Encounter (Signed)
Erskine Squibb has some STAT results to report.

## 2014-08-04 NOTE — Telephone Encounter (Signed)
Received a call from [facility] with [facility].She wanted to let Dr.Hochrein know patient had carotid duplex today which was abnormal revealing greater than 70 % stenosis in both rt and lf carotid arteries.Message sent to Dr.Hochrein.

## 2014-08-05 ENCOUNTER — Telehealth: Payer: Self-pay | Admitting: *Deleted

## 2014-08-05 DIAGNOSIS — I6523 Occlusion and stenosis of bilateral carotid arteries: Secondary | ICD-10-CM

## 2014-08-05 NOTE — Telephone Encounter (Signed)
Carotid doppler results called and given to wife.  Will repeat test in 6 months and instructed to call if he has any problems.  Voiced understanding.

## 2014-08-05 NOTE — Telephone Encounter (Signed)
-----   Message from Rollene Rotunda, MD sent at 08/05/2014  3:29 PM EST ----- About 70% stenosis.  Follow up again in six months.  Call Mr. Standish with the results and send results to [name]., MD

## 2014-08-07 NOTE — Telephone Encounter (Signed)
Patient's wife was called 08/05/14 with carotid doppler results by Ronnell Guadalajara CMA.Advised to repeat in 6 months.

## 2014-12-05 ENCOUNTER — Encounter: Payer: Self-pay | Admitting: Internal Medicine

## 2014-12-05 ENCOUNTER — Ambulatory Visit (INDEPENDENT_AMBULATORY_CARE_PROVIDER_SITE_OTHER): Payer: Medicare Other | Admitting: Internal Medicine

## 2014-12-05 VITALS — BP 118/78 | HR 60 | Ht 70.0 in | Wt 179.0 lb

## 2014-12-05 DIAGNOSIS — Z950 Presence of cardiac pacemaker: Secondary | ICD-10-CM

## 2014-12-05 DIAGNOSIS — I482 Chronic atrial fibrillation, unspecified: Secondary | ICD-10-CM

## 2014-12-05 DIAGNOSIS — I10 Essential (primary) hypertension: Secondary | ICD-10-CM

## 2014-12-05 LAB — MDC_IDC_ENUM_SESS_TYPE_INCLINIC
Battery Impedance: 1000 Ohm — CL
Brady Statistic RV Percent Paced: 71 %
Implantable Pulse Generator Serial Number: 2164677
Lead Channel Impedance Value: 444 Ohm
Lead Channel Pacing Threshold Amplitude: 0.5 V
Lead Channel Sensing Intrinsic Amplitude: 12 mV
Lead Channel Setting Pacing Amplitude: 2.5 V
Lead Channel Setting Pacing Pulse Width: 0.4 ms
Lead Channel Setting Sensing Sensitivity: 2 mV
MDC IDC MSMT BATTERY VOLTAGE: 2.79 V
MDC IDC MSMT LEADCHNL RV PACING THRESHOLD PULSEWIDTH: 0.4 ms
MDC IDC SESS DTM: 20160304085336

## 2014-12-05 LAB — PACEMAKER DEVICE OBSERVATION

## 2014-12-05 NOTE — Progress Notes (Signed)
HPI [name] returns today for followup. He is a very pleasant 81 year old man with a history of atrial fibrillation, COPD, ongoing tobacco abuse, symptomatic bradycardia, status post permanent pacemaker insertion. He is not thought to be an anticoagulation candidate secondary to gastrointestinal bleeding. Since I saw him last a year ago, he has done well with no chest pain. He denies peripheral edema. He does admit to a sedentary lifestyle. He has residual swelling in his right leg, secondary to a remote DVT. The patient refuses to wear a support stocking. He has chronic dyspnea. Allergies  Allergen Reactions  . Albuterol     REACTION: agitation  . Statins   . Sulfonamide Derivatives Other (See Comments)    unknown     Current Outpatient Prescriptions  Medication Sig Dispense Refill  . aspirin 81 MG tablet Take 81 mg by mouth daily.      Marland Kitchen ezetimibe (ZETIA) 10 MG tablet Take 10 mg by mouth daily.      . furosemide (LASIX) 80 MG tablet Take 1 tablet (80 mg total) by mouth as directed. (Patient taking differently: Take 80 mg by mouth as needed for fluid or edema. ) 60 each 6  . glyBURIDE (DIABETA) 5 MG tablet Take 10 mg by mouth 2 (two) times daily with a meal.     . hydrALAZINE (APRESOLINE) 25 MG tablet 25 mg. Take 1/2 tab three times daily    . isosorbide mononitrate (IMDUR) 30 MG 24 hr tablet Take 30 mg by mouth 2 (two) times daily.      . miglitol (GLYSET) 50 MG tablet Take 50 mg by mouth 3 (three) times daily.       No current facility-administered medications for this visit.     Past Medical History  Diagnosis Date  . Respiratory failure     secondary to pleural effusion, severe COPD, ongoing tobacco abuse, and congestive heart failure)  . Heart block     with sinus pauses (status post St. Jude pacemaker by Dr. Graciela Husbands)  . Chronic renal insufficiency   . Chronic atrial fibrillation     (no Coumadin secondary to GI bleeding)  . Diabetes mellitus   . Coronary artery disease  2009    last catheterization in 2009 demonstrate a 3-vessel CAD . He underwent stenting with a right  coronary artery with a  PROMUS drug-eluting stent. He  also had a stenting of his circumflex with drug-eluting  stent previously))  . Cardiomyopathy     (EF 40%)  . Degenerative joint disease   . Hypertension   . Hyperlipidemia   . Peptic ulcer disease   . Peripheral vascular disease   . Sinoatrial node dysfunction     ROS:   All systems reviewed and negative except as noted in the HPI.   Past Surgical History  Procedure Laterality Date  . Cholecystectomy    . Insert / replace / remove pacemaker      St. Jude     Family History  Problem Relation Age of Onset  . Coronary artery disease Neg Hx      History   Social History  . Marital Status: Married    Spouse Name: N/A  . Number of Children: N/A  . Years of Education: N/A   Occupational History  . retired     Hotel manager   Social History Main Topics  . Smoking status: Current Every Day Smoker -- 1.50 packs/day for 60 years    Types: Cigarettes    Last Attempt  to Quit: 06/15/2007  . Smokeless tobacco: Not on file  . Alcohol Use: Yes     Comment: occasionally have "a beer"  . Drug Use: Not on file  . Sexual Activity: Not on file   Other Topics Concern  . Not on file   Social History Narrative     BP 118/78 mmHg  Pulse 60  Ht 5\' 10"  (1.778 m)  Wt 179 lb (81.194 kg)  BMI 25.68 kg/m2  Physical Exam:  Chronically ill appearing 80 year old man, NAD HEENT: Unremarkable Neck:  6 cm JVD, no thyromegally, carotids without bruit Lungs:  Clear except for scattered rales in the bases bilaterally. HEART:  IRegular rate rhythm, no murmurs, no rubs, no clicks Abd:  soft, positive bowel sounds, no organomegally, no rebound, no guarding Ext:  2 plus pulses, no edema, no cyanosis, no clubbing Skin:  No rashes no nodules Neuro:  CN II through XII intact, motor grossly intact  DEVICE  Normal device function.  See  PaceArt for details.   Assess/Plan:

## 2014-12-05 NOTE — Assessment & Plan Note (Signed)
His blood pressure is well controlled. He will continue his current meds. 

## 2014-12-05 NOTE — Patient Instructions (Addendum)
Your physician recommends that you schedule a follow-up appointment in: Device clinic in 6 months and with Dr. Ladona Ridgel in 1 year.   Your physician recommends that you continue on your current medications as directed. Please refer to the Current Medication list given to you today.  Thank you for choosing [facility]!

## 2014-12-05 NOTE — Assessment & Plan Note (Signed)
His St. Jude DDD PM is working normally. Will recheck in several months. 

## 2014-12-05 NOTE — Assessment & Plan Note (Signed)
His ventricular rate is controlled. He is not anti-coagulated due to a h/o GI bleeding. Will follow

## 2014-12-08 ENCOUNTER — Encounter: Payer: Self-pay | Admitting: Internal Medicine

## 2015-06-25 ENCOUNTER — Ambulatory Visit (INDEPENDENT_AMBULATORY_CARE_PROVIDER_SITE_OTHER): Payer: Medicare Other | Admitting: *Deleted

## 2015-06-25 DIAGNOSIS — I482 Chronic atrial fibrillation, unspecified: Secondary | ICD-10-CM

## 2015-06-25 DIAGNOSIS — I495 Sick sinus syndrome: Secondary | ICD-10-CM

## 2015-06-25 LAB — CUP PACEART INCLINIC DEVICE CHECK
Battery Impedance: 1000 Ohm — CL
Date Time Interrogation Session: 20160922161809
Lead Channel Impedance Value: 476 Ohm
Lead Channel Pacing Threshold Amplitude: 0.5 V
Lead Channel Sensing Intrinsic Amplitude: 12 mV
MDC IDC MSMT BATTERY VOLTAGE: 2.79 V
MDC IDC MSMT LEADCHNL RV PACING THRESHOLD PULSEWIDTH: 0.4 ms
MDC IDC SET LEADCHNL RV PACING AMPLITUDE: 2.5 V
MDC IDC SET LEADCHNL RV PACING PULSEWIDTH: 0.4 ms
MDC IDC SET LEADCHNL RV SENSING SENSITIVITY: 2 mV
Pulse Gen Model: 5626
Pulse Gen Serial Number: 2164677

## 2015-06-25 NOTE — Progress Notes (Signed)
Pacemaker check in clinic. Normal device function. Threshold, sensing, and impedance consistent with previous measurements. Device programmed to maximize longevity. Permanent AF + ASA---h/o melena. No high ventricular rates noted. Device programmed at appropriate safety margins. Histogram distribution appropriate for patient activity level. Device programmed to optimize intrinsic conduction. Estimated longevity 7.5-9.25 years. Patient will follow up with GT/R in 6 months.

## 2015-07-15 ENCOUNTER — Encounter: Payer: Self-pay | Admitting: Internal Medicine

## 2015-09-16 ENCOUNTER — Encounter: Payer: Self-pay | Admitting: Cardiology

## 2015-09-16 ENCOUNTER — Ambulatory Visit (INDEPENDENT_AMBULATORY_CARE_PROVIDER_SITE_OTHER): Payer: Medicare Other | Admitting: Cardiology

## 2015-09-16 VITALS — BP 155/55 | HR 63 | Ht 69.0 in | Wt 186.0 lb

## 2015-09-16 DIAGNOSIS — I10 Essential (primary) hypertension: Secondary | ICD-10-CM | POA: Diagnosis not present

## 2015-09-16 DIAGNOSIS — I482 Chronic atrial fibrillation, unspecified: Secondary | ICD-10-CM

## 2015-09-16 NOTE — Patient Instructions (Signed)

## 2015-09-16 NOTE — Progress Notes (Signed)
HPI The patient presents followup of his CAD.  Since I last saw him he has had no cardiovascular problems. He has no new dyspnea, PND or orthopnea. He stopped smoking cigarettes!. He is very limited by joint pains. He gets around a little bit in his house.  He denies any chest pressure, neck or arm discomfort. He doesn't notice his fibrillation and has had no presyncope or syncope.  His wife reports that he has had problems with anxiety and confusion.   Allergies  Allergen Reactions  . Albuterol     REACTION: agitation  . Statins   . Sulfonamide Derivatives Other (See Comments)    unknown    Current Outpatient Prescriptions  Medication Sig Dispense Refill  . aspirin 81 MG tablet Take 81 mg by mouth daily.      Marland Kitchen ezetimibe (ZETIA) 10 MG tablet Take 10 mg by mouth daily.      . furosemide (LASIX) 80 MG tablet Take 1 tablet (80 mg total) by mouth as directed. (Patient taking differently: Take 80 mg by mouth as needed for fluid or edema. ) 60 each 6  . glyBURIDE (DIABETA) 5 MG tablet Take 10 mg by mouth 2 (two) times daily with a meal.     . hydrALAZINE (APRESOLINE) 25 MG tablet 25 mg. Take 1/2 tablet by mouth three times daily    . isosorbide mononitrate (IMDUR) 30 MG 24 hr tablet Take 30 mg by mouth 2 (two) times daily.      . miglitol (GLYSET) 50 MG tablet Take 100 mg by mouth 3 (three) times daily.      No current facility-administered medications for this visit.    Past Medical History  Diagnosis Date  . Respiratory failure (HCC)     secondary to pleural effusion, severe COPD, ongoing tobacco abuse, and congestive heart failure)  . Heart block     with sinus pauses (status post St. Jude pacemaker by Dr. Graciela Husbands)  . Chronic renal insufficiency   . Chronic atrial fibrillation (HCC)     (no Coumadin secondary to GI bleeding)  . Diabetes mellitus   . Coronary artery disease 2009    last catheterization in 2009 demonstrate a 3-vessel CAD . He underwent stenting with a right   coronary artery with a  PROMUS drug-eluting stent. He  also had a stenting of his circumflex with drug-eluting  stent previously))  . Cardiomyopathy     (EF 40%)  . Degenerative joint disease   . Hypertension   . Hyperlipidemia   . Peptic ulcer disease   . Peripheral vascular disease (HCC)   . Sinoatrial node dysfunction [facility])     Past Surgical History  Procedure Laterality Date  . Cholecystectomy    . Insert / replace / remove pacemaker      St. Jude    ROS:  As stated in the HPI and negative for all other systems.  PHYSICAL EXAM BP 155/55 mmHg  Pulse 63  Ht  (1.753 m)  Wt 186 lb (84.369 kg)  BMI 27.45 kg/m2 GENERAL:  Chronically ill appearing, no acute distress NECK:  No jugular venous distention at 90 degrees, waveform within normal limits, carotid upstroke brisk and symmetric, right bruits, no thyromegaly LUNGS:  Upper expiratory wheezing CHEST:  Unremarkable, pacemaker in place HEART:  PMI not displaced or sustained,S1 and S2 within normal limits, no S3, no clicks, no rubs, no murmurs, distant heart sounds. ABD:  Flat, positive bowel sounds normal in frequency in pitch, no  bruits, no rebound, no guarding, no midline pulsatile mass, no hepatomegaly, no splenomegaly EXT:  2 plus pulses upper absent DP/PT, no ankle edema, no cyanosis no clubbing SKIN:  No rashes no nodules   EKG: Atrial fibrillation, ventricular pacing rate 66  09/16/2015  ASSESSMENT AND PLAN  ATRIAL FIBRILLATION:  He tolerates this rhythm.  With the past bleeding history and also his desire not to take anticoagulation we are not considering this. No change in therapy is indicated.  CAD:  The patient has no new sypmtoms.  No further cardiovascular testing is indicated.  We will continue with aggressive risk reduction and meds as listed.  TOBACCO ABUSE:  I am proud of him for not smoking.  BRUIT:  He did not have Doppler.  I will be conservative and I plan no follow up Dopplers  PACEMAKER:  He is  up to date with follow up of this.

## 2015-12-29 ENCOUNTER — Ambulatory Visit (HOSPITAL_COMMUNITY): Payer: Medicare Other | Attending: Internal Medicine | Admitting: Physical Therapy

## 2015-12-29 DIAGNOSIS — S81801D Unspecified open wound, right lower leg, subsequent encounter: Secondary | ICD-10-CM | POA: Insufficient documentation

## 2015-12-29 DIAGNOSIS — S81802D Unspecified open wound, left lower leg, subsequent encounter: Secondary | ICD-10-CM | POA: Diagnosis not present

## 2015-12-29 DIAGNOSIS — X58XXXD Exposure to other specified factors, subsequent encounter: Secondary | ICD-10-CM | POA: Diagnosis not present

## 2015-12-29 DIAGNOSIS — R262 Difficulty in walking, not elsewhere classified: Secondary | ICD-10-CM | POA: Insufficient documentation

## 2015-12-29 NOTE — Therapy (Signed)
[facility] [address] [place], [place], [address] Phone: [phone]   Fax:  [phone]  Wound Care Evaluation  Patient Details  Name: [name] MRN: [id number] Date of Birth: [birth year] No Data Recorded  Encounter Date: 12/29/2015      PT End of Session - 12/29/15 1507    Visit Number 1   Number of Visits 16   Date for PT Re-Evaluation 01/26/16   Authorization Type Medicare/Tricare for life    Authorization Time Period 12/29/15 to 02/28/16   Authorization - Visit Number 1   Authorization - Number of Visits 10   PT Start Time 1303   PT Stop Time 1350   PT Time Calculation (min) 47 min   Activity Tolerance Patient tolerated treatment well   Behavior During Therapy [facility] for tasks assessed/performed      Past Medical History  Diagnosis Date  . Respiratory failure (HCC)     secondary to pleural effusion, severe COPD, ongoing tobacco abuse, and congestive heart failure)  . Heart block     with sinus pauses (status post St. Jude pacemaker by Dr. Graciela Husbands)  . Chronic renal insufficiency   . Chronic atrial fibrillation (HCC)     (no Coumadin secondary to GI bleeding)  . Diabetes mellitus   . Coronary artery disease 2009    last catheterization in 2009 demonstrate a 3-vessel CAD . He underwent stenting with a right  coronary artery with a  PROMUS drug-eluting stent. He  also had a stenting of his circumflex with drug-eluting  stent previously))  . Cardiomyopathy     (EF 40%)  . Degenerative joint disease   . Hypertension   . Hyperlipidemia   . Peptic ulcer disease   . Peripheral vascular disease (HCC)   . Sinoatrial node dysfunction [facility])     Past Surgical History  Procedure Laterality Date  . Cholecystectomy    . Insert / replace / remove pacemaker      St. Jude    There were no vitals filed for this visit.  Visit Diagnosis:  Leg wound, left, subsequent encounter - Plan: PT plan of care cert/re-cert  Leg wound,  right, subsequent encounter - Plan: PT plan of care cert/re-cert  Difficulty in walking, not elsewhere classified - Plan: PT plan of care cert/re-cert      Subjective Assessment - 12/29/15 1446    Currently in Pain? No/denies  just tender around wounds            Wound Therapy - 12/29/15 1455    Wound Properties Date First Assessed: 12/29/15 Wound Type: Laceration Location: Tibial Location Orientation: Right Present on Admission: Yes   Dressing Type Gauze (Comment)   Dressing Changed New   Dressing Status Old drainage   Dressing Change Frequency PRN   % Wound base Red or Granulating 60%   % Wound base Yellow 40%   Peri-wound Assessment Intact;Pink;Other (Comment)  scar tissue, multiple scabs on tibia    Wound Length (cm) 4.25 cm   Wound Width (cm) 4 cm   Wound Depth (cm) 0 cm   Tunneling (cm) 0   Undermining (cm) 0   Margins Unattached edges (unapproximated)   Closure None   Drainage Amount Minimal   Drainage Description Serosanguineous   Treatment Cleansed;Packing (Dry gauze);Other (Comment)  medihoney, gauze wrapping   Wound Properties Date First Assessed: 12/29/15 Wound Type: Laceration Location: Tibial Location Orientation: Left   Dressing Type Gauze (Comment)   Dressing Changed New  Dressing Status Old drainage   Dressing Change Frequency PRN   % Wound base Red or Granulating 50%   % Wound base Yellow 50%   % Wound base Black 0%   % Wound base Other (Comment) 0%   Wound Length (cm) 3.5 cm   Wound Width (cm) 1.5 cm   Wound Depth (cm) 0 cm   Tunneling (cm) 0   Undermining (cm) 0   Margins Unattached edges (unapproximated)   Closure None   Drainage Amount Minimal   Drainage Description Serosanguineous   Treatment Cleansed;Other (Comment)  medihoney, gauze   Wound Therapy - Clinical Statement Patient arrives with his wife, who reports taht they are not sure when wounds started but does advise that patient is "not all there mentally", has history of ICU  psychosis and tends to pick at legs; she also reports that he has blood clots but chart review and wife show no history of blood thinners in chart. Upon examination, patient reveals bilateral wounds on anterior tibias with no depth but some slough on both sides. Dressed with medihoney and gauze; due to patient's wife reports of blood clots without blood thinner, did not use compression today, will follow up with MD regarding this. Recommend skilled PT services to facilitate full wound healing at this time.     Wound Therapy - Functional Problem List pain, difficulty walking    Factors Delaying/Impairing Wound Healing Tobacco use;Diabetes Mellitus;Vascular compromise   Wound Therapy - Frequency Other (comment)  2x/week    Wound Therapy - Current Recommendations PT   Wound Plan medihoney and gauze;                         PT Education - 12/29/15 1506    Education provided Yes   Education Details prognosis, plan of care, what to do if bandages come off    Person(s) Educated Spouse;Patient   Methods Explanation   Comprehension Verbalized understanding          PT Short Term Goals - 12/29/15 1508    PT SHORT TERM GOAL #1   Title Patient will demonstrate slough 0% bilateral wounds in order to demonstrate resoluation of wound infection and improvements in healing status    Time 4   Period Weeks   Status New   PT SHORT TERM GOAL #2   Title Patient will demonstrate closure of both wounsd by at least 60% in order to demonstrate improved wound healing status    Time 4   Period Weeks   Status New   PT SHORT TERM GOAL #3   Title Patient's wife to be able to correctly identify signs/symptoms of wound infection and demonstrate proper wound bandaging in order to increase independence in managing condition    Time 4   Period Weeks   Status New           PT Long Term Goals - 12/29/15 1513    PT LONG TERM GOAL #1   Title Patient to demonstrate full wound closure bilaterally  in order to demonstrate improved health condition and would healing    Time 8   Period Weeks   Status New   PT LONG TERM GOAL #2   Title Patient and his wife to be independent in exercises promoting bloodflow to lower extremities, including ankle pumps and toe/heel raises, in order to assist in reducing complications from vascualr status    Time 8   Period Weeks   Status New  PT LONG TERM GOAL #3   Title Patient and his wife to be independent in safety awareness and technique in order to avoid reinjury of B LEs in order to maintain good health status and to prevent injury    Time 8   Period Weeks   Status New                G-Codes - 01/27/16 1523    Functional Assessment Tool Used Based on overall wound healing status and co-morbidities possibly affecting healing    Functional Limitation Other PT primary   Other PT Primary Current Status (U9811) At least 40 percent but less than 60 percent impaired, limited or restricted   Other PT Primary Goal Status (B1478) At least 20 percent but less than 40 percent impaired, limited or restricted      Problem List Patient Active Problem List   Diagnosis Date Noted  . CHF (congestive heart failure) (HCC) 04/18/2014  . COPD exacerbation (HCC) 04/18/2014  . Acute on chronic systolic heart failure (HCC) 10/14/2011  . Sinoatrial node dysfunction (HCC)   . TOBACCO USER 01/27/2010  . Coronary atherosclerosis 01/27/2010  . Cardiac pacemaker in situ 07/01/2009  . DIABETES MELLITUS 01/20/2009  . HYPERLIPIDEMIA 01/20/2009  . Essential hypertension 01/20/2009  . Atrial fibrillation (HCC) 01/20/2009  . ANEMIA, NORMOCYTIC 12/17/2008  . MELENA, HX OF 12/17/2008    Nedra Hai PT, DPT [id number]  [facility] [address] [place], [place], [address] Phone: [phone]   Fax:  [phone]  Name: DEVONNE LALANI MRN: [id number] Date of Birth: 1928/02/03

## 2015-12-30 ENCOUNTER — Telehealth: Payer: Self-pay | Admitting: Cardiology

## 2015-12-30 ENCOUNTER — Telehealth (HOSPITAL_COMMUNITY): Payer: Self-pay | Admitting: Physical Therapy

## 2015-12-30 NOTE — Telephone Encounter (Signed)
Pt was telling PT he has blood clots in his history. Per MD last OV notes not indications of history of blood clots. Advised that pt is on low dose ASA, has device, and is in rate controlled a. Fib. She verbalized understanding, no additional questions at this time.

## 2015-12-30 NOTE — Telephone Encounter (Signed)
Spoke to staff at Dr. Sharyon Medicus office regarding patient's wife's claim that patient has had blood clots in the past and is not on blood thinner. Staff reviewed chart and reports no knowledge of history of blood clots, confirm that patient is not on blood thinner, recommend that PT call patient's cardiologist to confirm this however.  Nedra Hai PT, DPT [phone]

## 2015-12-30 NOTE — Telephone Encounter (Signed)
Spoke to RN at patient's cardiologist's office (Dr. Antoine Poche); they also reviewed patient's records and chart and could find no mention of blood clots but did confirm that patient is not on blood thinners at this time. RN confirmed that patient should be ok for compression to be applied to wounds.   Nedra Hai PT, DPT [phone]

## 2015-12-30 NOTE — Telephone Encounter (Signed)
ATtempted to call Dr. Sharyon Medicus office to confirm presence of blood clots/abscense of blood thinners that patient's wife had reported the other day. Left general message on switchboard asking them to please call front desk back to discuss a patient Dr. Sherwood Gambler had referred.   Nedra Hai PT, DPT [phone]

## 2015-12-31 ENCOUNTER — Ambulatory Visit (HOSPITAL_COMMUNITY): Payer: Medicare Other | Admitting: Physical Therapy

## 2015-12-31 DIAGNOSIS — S81802D Unspecified open wound, left lower leg, subsequent encounter: Secondary | ICD-10-CM | POA: Diagnosis not present

## 2015-12-31 DIAGNOSIS — S81801D Unspecified open wound, right lower leg, subsequent encounter: Secondary | ICD-10-CM

## 2015-12-31 DIAGNOSIS — R262 Difficulty in walking, not elsewhere classified: Secondary | ICD-10-CM

## 2015-12-31 NOTE — Therapy (Signed)
[facility] [address] [place], [place], [address] Phone: [phone]   Fax:  [phone]  Wound Care Therapy  Patient Details  Name: [name] MRN: [id number] Date of Birth: [birth year] No Data Recorded  Encounter Date: 12/31/2015      PT End of Session - 12/31/15 1818    Visit Number 2   Number of Visits 16   Date for PT Re-Evaluation 01/26/16   Authorization Type Medicare/Tricare for life    Authorization Time Period 12/29/15 to 02/28/16   Authorization - Visit Number 2   Authorization - Number of Visits 10   PT Start Time 1733   PT Stop Time 1808  finsihed with skiled wound care    PT Time Calculation (min) 35 min   Activity Tolerance Patient tolerated treatment well   Behavior During Therapy [facility] for tasks assessed/performed      Past Medical History  Diagnosis Date  . Respiratory failure (HCC)     secondary to pleural effusion, severe COPD, ongoing tobacco abuse, and congestive heart failure)  . Heart block     with sinus pauses (status post St. Jude pacemaker by Dr. Graciela Husbands)  . Chronic renal insufficiency   . Chronic atrial fibrillation (HCC)     (no Coumadin secondary to GI bleeding)  . Diabetes mellitus   . Coronary artery disease 2009    last catheterization in 2009 demonstrate a 3-vessel CAD . He underwent stenting with a right  coronary artery with a  PROMUS drug-eluting stent. He  also had a stenting of his circumflex with drug-eluting  stent previously))  . Cardiomyopathy     (EF 40%)  . Degenerative joint disease   . Hypertension   . Hyperlipidemia   . Peptic ulcer disease   . Peripheral vascular disease (HCC)   . Sinoatrial node dysfunction [facility])     Past Surgical History  Procedure Laterality Date  . Cholecystectomy    . Insert / replace / remove pacemaker      St. Jude    There were no vitals filed for this visit.  Visit Diagnosis:  Leg wound, left, subsequent encounter  Leg wound, right,  subsequent encounter  Difficulty in walking, not elsewhere classified                 Wound Therapy - 12/31/15 1812    Subjective Patient reports that he is feeling good, his legs aren't bothering him and he has no pain.    Patient and Family Stated Goals wounds to heal    Prior Treatments MD has told them to use antibiotic cream and gauze    Evaluation and Treatment Procedures Explained to Patient/Family Yes   Evaluation and Treatment Procedures agreed to   Wound Properties Date First Assessed: 12/29/15 Wound Type: Laceration Location: Tibial Location Orientation: Right Present on Admission: Yes   Dressing Type Gauze (Comment)   Dressing Changed Changed   Dressing Status Old drainage   Dressing Change Frequency Other (Comment)   % Wound base Red or Granulating 65%   % Wound base Yellow 35%   Peri-wound Assessment Intact;Pink;Other (Comment)   Margins Unattached edges (unapproximated)   Closure None   Drainage Amount Moderate   Drainage Description Serosanguineous   Treatment Other (Comment);Debridement (Selective);Cleansed  cleansed, debridement, medihoney, profore lite     Wound Properties Date First Assessed: 12/29/15 Wound Type: Laceration Location: Tibial Location Orientation: Left   Dressing Type Gauze (Comment)   Dressing Changed Changed  Dressing Status Old drainage   Dressing Change Frequency Other (Comment)  twice a week    % Wound base Red or Granulating 95%   % Wound base Yellow 5%   Margins Unattached edges (unapproximated)   Closure None   Drainage Amount Moderate   Drainage Description Serosanguineous   Treatment Other (Comment)  cleansed, xeroform, profore lite    Selective Debridement - Location L LE wound    Selective Debridement - Tools Used Scalpel   Wound Therapy - Clinical Statement Patient arrives with his gauze wraps still intact; over the past couple of days, PT had contacted PCP and cardiologist, who confiremd that patient does not have  active clots and is OK for compression. Noted moderate serosanguinous drainage today but suspect that this is from honey rather than true wound drainage. Wound on R LE appears to be responding very well to honey, however wound on L LE continues to have slough present. Trialed profore lites on patient today to assist in removing edema from LEs and facilitating healing. Educated family about signs/symptoms to watch out for that would requrie wraps to be taken off.    Wound Therapy - Functional Problem List pain, difficulty walking    Factors Delaying/Impairing Wound Healing Tobacco use;Diabetes Mellitus;Vascular compromise   Wound Therapy - Frequency Other (comment)  2x/week    Wound Therapy - Current Recommendations PT   Wound Plan --  xeroform R LE, medihoney L LE; profore lite if well tolerate                 PT Education - 12/31/15 1818    Education provided Yes   Education Details signs/symptmos that would require profore lite wraps to be taken off    Person(s) Educated Patient;Spouse;Child(ren)   Methods Explanation   Comprehension Verbalized understanding          PT Short Term Goals - 12/29/15 1508    PT SHORT TERM GOAL #1   Title Patient will demonstrate slough 0% bilateral wounds in order to demonstrate resoluation of wound infection and improvements in healing status    Time 4   Period Weeks   Status New   PT SHORT TERM GOAL #2   Title Patient will demonstrate closure of both wounsd by at least 60% in order to demonstrate improved wound healing status    Time 4   Period Weeks   Status New   PT SHORT TERM GOAL #3   Title Patient's wife to be able to correctly identify signs/symptoms of wound infection and demonstrate proper wound bandaging in order to increase independence in managing condition    Time 4   Period Weeks   Status New           PT Long Term Goals - 12/29/15 1513    PT LONG TERM GOAL #1   Title Patient to demonstrate full wound closure  bilaterally in order to demonstrate improved health condition and would healing    Time 8   Period Weeks   Status New   PT LONG TERM GOAL #2   Title Patient and his wife to be independent in exercises promoting bloodflow to lower extremities, including ankle pumps and toe/heel raises, in order to assist in reducing complications from vascualr status    Time 8   Period Weeks   Status New   PT LONG TERM GOAL #3   Title Patient and his wife to be independent in safety awareness and technique in order to avoid reinjury of  B LEs in order to maintain good health status and to prevent injury    Time 8   Period Weeks   Status New                Problem List Patient Active Problem List   Diagnosis Date Noted  . CHF (congestive heart failure) (HCC) 04/18/2014  . COPD exacerbation (HCC) 04/18/2014  . Acute on chronic systolic heart failure (HCC) 10/14/2011  . Sinoatrial node dysfunction (HCC)   . TOBACCO USER 01/27/2010  . Coronary atherosclerosis 01/27/2010  . Cardiac pacemaker in situ 07/01/2009  . DIABETES MELLITUS 01/20/2009  . HYPERLIPIDEMIA 01/20/2009  . Essential hypertension 01/20/2009  . Atrial fibrillation (HCC) 01/20/2009  . ANEMIA, NORMOCYTIC 12/17/2008  . MELENA, HX OF 12/17/2008    Nedra Hai PT, DPT [phone]  [facility] [address] [place], [place], [address] Phone: [phone]   Fax:  [phone]  Name: [name] MRN: [id number] Date of Birth: [birth year]

## 2016-01-04 ENCOUNTER — Ambulatory Visit (HOSPITAL_COMMUNITY): Payer: Medicare Other | Attending: Internal Medicine | Admitting: Physical Therapy

## 2016-01-04 ENCOUNTER — Telehealth (HOSPITAL_COMMUNITY): Payer: Self-pay | Admitting: Physical Therapy

## 2016-01-04 ENCOUNTER — Ambulatory Visit (HOSPITAL_COMMUNITY): Payer: Medicare Other | Admitting: Physical Therapy

## 2016-01-04 DIAGNOSIS — M6281 Muscle weakness (generalized): Secondary | ICD-10-CM | POA: Diagnosis present

## 2016-01-04 DIAGNOSIS — S81802S Unspecified open wound, left lower leg, sequela: Secondary | ICD-10-CM | POA: Diagnosis present

## 2016-01-04 DIAGNOSIS — R2689 Other abnormalities of gait and mobility: Secondary | ICD-10-CM | POA: Insufficient documentation

## 2016-01-04 DIAGNOSIS — R2681 Unsteadiness on feet: Secondary | ICD-10-CM | POA: Diagnosis present

## 2016-01-04 DIAGNOSIS — S81802D Unspecified open wound, left lower leg, subsequent encounter: Secondary | ICD-10-CM

## 2016-01-04 DIAGNOSIS — S81801D Unspecified open wound, right lower leg, subsequent encounter: Secondary | ICD-10-CM | POA: Diagnosis present

## 2016-01-04 DIAGNOSIS — X58XXXD Exposure to other specified factors, subsequent encounter: Secondary | ICD-10-CM | POA: Diagnosis not present

## 2016-01-04 DIAGNOSIS — S81801S Unspecified open wound, right lower leg, sequela: Secondary | ICD-10-CM | POA: Insufficient documentation

## 2016-01-04 DIAGNOSIS — R262 Difficulty in walking, not elsewhere classified: Secondary | ICD-10-CM | POA: Insufficient documentation

## 2016-01-04 NOTE — Telephone Encounter (Signed)
Patient a no-show for today's appointment. Called and left message, including that PT has a 4:45 appointment open today if they would like to take it. Asked patient/wife to return PT's phone call with what they would like to do.  Nedra Hai PT, DPT [phone]

## 2016-01-04 NOTE — Therapy (Signed)
[facility] [address] [place], [place], [address] Phone: [phone]   Fax:  [phone]  Wound Care Therapy  Patient Details  Name: [name] MRN: [id number] Date of Birth: October 26, 1927 No Data Recorded  Encounter Date: 01/04/2016      PT End of Session - 01/04/16 1803    Visit Number 3   Number of Visits 16   Date for PT Re-Evaluation 01/26/16   Authorization Type Medicare/Tricare for life    Authorization Time Period 12/29/15 to 02/28/16   Authorization - Visit Number 3   Authorization - Number of Visits 10   PT Start Time 1647   PT Stop Time 1735   PT Time Calculation (min) 48 min   Activity Tolerance Patient tolerated treatment well   Behavior During Therapy [facility] for tasks assessed/performed      Past Medical History  Diagnosis Date  . Respiratory failure (HCC)     secondary to pleural effusion, severe COPD, ongoing tobacco abuse, and congestive heart failure)  . Heart block     with sinus pauses (status post St. Jude pacemaker by Dr. Graciela Husbands)  . Chronic renal insufficiency   . Chronic atrial fibrillation (HCC)     (no Coumadin secondary to GI bleeding)  . Diabetes mellitus   . Coronary artery disease 2009    last catheterization in 2009 demonstrate a 3-vessel CAD . He underwent stenting with a right  coronary artery with a  PROMUS drug-eluting stent. He  also had a stenting of his circumflex with drug-eluting  stent previously))  . Cardiomyopathy     (EF 40%)  . Degenerative joint disease   . Hypertension   . Hyperlipidemia   . Peptic ulcer disease   . Peripheral vascular disease (HCC)   . Sinoatrial node dysfunction [facility])     Past Surgical History  Procedure Laterality Date  . Cholecystectomy    . Insert / replace / remove pacemaker      St. Jude    There were no vitals filed for this visit.  Visit Diagnosis:  Leg wound, left, subsequent encounter  Leg wound, right, subsequent encounter  Difficulty in  walking, not elsewhere classified                 Wound Therapy - 01/04/16 1754    Subjective Patient arrives reporting that wraps have felt good but slid down a little, no pain today however; family reports they are already noticing volume changes in his legs.    Patient and Family Stated Goals wounds to heal    Prior Treatments MD has told them to use antibiotic cream and gauze    Pain Assessment No/denies pain   Evaluation and Treatment Procedures Explained to Patient/Family Yes   Evaluation and Treatment Procedures agreed to   Wound Properties Date First Assessed: 12/29/15 Wound Type: Laceration Location: Tibial Location Orientation: Right Present on Admission: Yes   Dressing Type Other (Comment)  profore lite    Dressing Changed Changed   Dressing Status Old drainage   Dressing Change Frequency Other (Comment)  x2 a week    % Wound base Red or Granulating 95%   % Wound base Yellow 5%   Peri-wound Assessment Intact;Pink;Other (Comment)   Margins Unattached edges (unapproximated)   Closure None   Drainage Amount Minimal   Drainage Description Serosanguineous   Treatment Cleansed;Debridement (Selective);Packing (Impregnated strip);Other (Comment)  light debridement, xeroform, vasoline, profore lite    Wound Properties Date First Assessed:  12/29/15 Wound Type: Laceration Location: Tibial Location Orientation: Left   Dressing Type Other (Comment)  profore lite    Dressing Changed Changed   Dressing Status Old drainage   Dressing Change Frequency Other (Comment)  2x/week    % Wound base Red or Granulating 80%   % Wound base Yellow 20%   Margins Unattached edges (unapproximated)   Closure None   Drainage Amount Minimal   Drainage Description Serosanguineous   Treatment Cleansed;Debridement (Selective);Packing (Impregnated strip);Other (Comment)  debridement, medihoney, gauze, vasoline, profore lite    Selective Debridement - Location B LE wounds    Selective  Debridement - Tools Used Forceps;Scalpel   Wound Therapy - Clinical Statement Patient arrives today after trial of Profore Lite compression wraps last session; wraps removed and patient appears to be tolerating Profore Lites very well overall with significant wound healing having occured R LE and signfiicantly less slough L LE. Did note blood blister on patient's R toe, which patient claims occured when his toe was bumped last session, and will continue to monitor/will send referral to MD if warranted. Did need to debride small amount of tissue R LE, and debrided quite a bit on L LE wound today. Dressed R LE with xeroform, L LE with medihoney; continues to wrap both LEs using Profore Lite and again re-educated patient and family regarding situations and parameters that would requrie removal of Profores. Confirmed date/time of next session with patient/family at end of session.    Wound Therapy - Functional Problem List pain, difficulty walking    Factors Delaying/Impairing Wound Healing Tobacco use;Diabetes Mellitus;Vascular compromise   Wound Therapy - Frequency Other (comment)  2x/week    Wound Therapy - Current Recommendations PT   Wound Plan xeroform and medihoney, profore lite as tolerated                  PT Education - 01/04/16 1802    Education provided Yes   Education Details re-educated regarding situations and parameters that would require Profore Lites to be cut off at home    Person(s) Educated Patient;Spouse;Child(ren)   Methods Explanation   Comprehension Verbalized understanding          PT Short Term Goals - 12/29/15 1508    PT SHORT TERM GOAL #1   Title Patient will demonstrate slough 0% bilateral wounds in order to demonstrate resoluation of wound infection and improvements in healing status    Time 4   Period Weeks   Status New   PT SHORT TERM GOAL #2   Title Patient will demonstrate closure of both wounsd by at least 60% in order to demonstrate improved  wound healing status    Time 4   Period Weeks   Status New   PT SHORT TERM GOAL #3   Title Patient's wife to be able to correctly identify signs/symptoms of wound infection and demonstrate proper wound bandaging in order to increase independence in managing condition    Time 4   Period Weeks   Status New           PT Long Term Goals - 12/29/15 1513    PT LONG TERM GOAL #1   Title Patient to demonstrate full wound closure bilaterally in order to demonstrate improved health condition and would healing    Time 8   Period Weeks   Status New   PT LONG TERM GOAL #2   Title Patient and his wife to be independent in exercises promoting bloodflow to lower extremities,  including ankle pumps and toe/heel raises, in order to assist in reducing complications from vascualr status    Time 8   Period Weeks   Status New   PT LONG TERM GOAL #3   Title Patient and his wife to be independent in safety awareness and technique in order to avoid reinjury of B LEs in order to maintain good health status and to prevent injury    Time 8   Period Weeks   Status New                Problem List Patient Active Problem List   Diagnosis Date Noted  . CHF (congestive heart failure) (HCC) 04/18/2014  . COPD exacerbation (HCC) 04/18/2014  . Acute on chronic systolic heart failure (HCC) 10/14/2011  . Sinoatrial node dysfunction (HCC)   . TOBACCO USER 01/27/2010  . Coronary atherosclerosis 01/27/2010  . Cardiac pacemaker in situ 07/01/2009  . DIABETES MELLITUS 01/20/2009  . HYPERLIPIDEMIA 01/20/2009  . Essential hypertension 01/20/2009  . Atrial fibrillation (HCC) 01/20/2009  . ANEMIA, NORMOCYTIC 12/17/2008  . MELENA, HX OF 12/17/2008    Nedra Hai PT, DPT [id number]  [facility] [address] [place], [place], [address] Phone: [phone]   Fax:  [phone]  Name: [name] MRN: [id number] Date of Birth: [birth year]

## 2016-01-08 ENCOUNTER — Ambulatory Visit (HOSPITAL_COMMUNITY): Payer: Medicare Other | Admitting: Physical Therapy

## 2016-01-08 ENCOUNTER — Ambulatory Visit (HOSPITAL_COMMUNITY): Payer: Medicare Other

## 2016-01-08 DIAGNOSIS — S81801D Unspecified open wound, right lower leg, subsequent encounter: Secondary | ICD-10-CM

## 2016-01-08 DIAGNOSIS — S81802D Unspecified open wound, left lower leg, subsequent encounter: Secondary | ICD-10-CM | POA: Diagnosis not present

## 2016-01-08 DIAGNOSIS — R262 Difficulty in walking, not elsewhere classified: Secondary | ICD-10-CM

## 2016-01-08 NOTE — Therapy (Signed)
[place], [place], [address] Phone: [phone]   Fax:  [phone]  Wound Care Therapy  Patient Details  Name: [name] MRN: [id number] Date of Birth: 1928/04/22 No Data Recorded  Encounter Date: 01/08/2016      PT End of Session - 01/08/16 1154    Visit Number 4   Number of Visits 16   Date for PT Re-Evaluation 01/26/16   Authorization Type Medicare/Tricare for life    Authorization Time Period 12/29/15 to 02/28/16   Authorization - Visit Number 4   Authorization - Number of Visits 10   PT Start Time 1020   PT Stop Time 1104   PT Time Calculation (min) 44 min   Activity Tolerance Patient tolerated treatment well   Behavior During Therapy [facility] for tasks assessed/performed      Past Medical History  Diagnosis Date  . Respiratory failure (HCC)     secondary to pleural effusion, severe COPD, ongoing tobacco abuse, and congestive heart failure)  . Heart block     with sinus pauses (status post St. Jude pacemaker by Dr. Caryl Comes)  . Chronic renal insufficiency   . Chronic atrial fibrillation (HCC)     (no Coumadin secondary to GI bleeding)  . Diabetes mellitus   . Coronary artery disease 2009    last catheterization in 2009 demonstrate a 3-vessel CAD . He underwent stenting with a right  coronary artery with a  PROMUS drug-eluting stent. He  also had a stenting of his circumflex with drug-eluting  stent previously))  . Cardiomyopathy     (EF 40%)  . Degenerative joint disease   . Hypertension   . Hyperlipidemia   . Peptic ulcer disease   . Peripheral vascular disease ([place])   . Sinoatrial node dysfunction [facility])     Past Surgical History  Procedure Laterality Date  . Cholecystectomy    . Insert / replace / remove pacemaker      St. Jude    There were no vitals filed for this visit.                  Wound Therapy - 01/08/16 1146    Subjective Pt states as long as his legs are not  touched he does not have any pain.  Wife states that home health is to come out to there house.  Therapist explained that insurance will not pay for both home health nursing and out patient physical therapy.  Pt wife vocalized that she did not want home health to treat the wounds and she would stop home health services.   Pt wife states wound on toe caused by pushing foot into shoe   Patient and Family Stated Goals wounds to heal    Pain Assessment --  with debridement 7/10 returns to 0 after debridement    Evaluation and Treatment Procedures Explained to Patient/Family Yes   Evaluation and Treatment Procedures agreed to   Wound Properties Date First Assessed: 12/29/15 Wound Type: Laceration Location: Tibial Location Orientation: Right Present on Admission: Yes   Dressing Type Other (Comment)  profore lite    Dressing Changed Changed   Dressing Status Old drainage   Dressing Change Frequency Other (Comment)  x2 a week    % Wound base Red or Granulating 100%   % Wound base Yellow 0%   Peri-wound Assessment Intact;Pink;Other (Comment)   Margins Unattached edges (unapproximated)   Closure None   Drainage Amount Minimal  Drainage Description Serosanguineous   Treatment Cleansed;Debridement (Selective)  multi layer compression wrap with profore lite    Wound Properties Date First Assessed: 12/29/15 Wound Type: Laceration Location: Tibial Location Orientation: Left   Dressing Type Other (Comment)  profore lite    Dressing Changed Changed   Dressing Status Old drainage   Dressing Change Frequency Other (Comment)  2x/week    % Wound base Red or Granulating 85%   % Wound base Yellow 15%   Margins Unattached edges (unapproximated)   Closure None   Drainage Amount Minimal   Drainage Description Serosanguineous   Treatment Cleansed;Debridement (Selective)  multilayer compression wrap with profore lite    Wound Properties Date First Assessed: 01/08/16 Time First Assessed: 1040 Wound Type:  Degloving Location: Toe (Comment  which one) Location Orientation: Left Wound Description (Comments): great toe    Dressing Type Gauze (Comment);Silver hydrofiber   Dressing Changed New   Dressing Change Frequency --  2x a week    Site / Wound Assessment Painful;Yellow   % Wound base Red or Granulating 0%   % Wound base Yellow 100%   Peri-wound Assessment Erythema (blanchable)   Wound Length (cm) --  wound wraps around great toe with ant,lateral and plantar    Drainage Amount Minimal   Drainage Description Serous   Treatment Cleansed;Debridement (Selective);Other (Comment)  dressed with hydrofiber 2x2 and gauze   Selective Debridement - Location B LE wounds    Selective Debridement - Tools Used Forceps;Scalpel   Wound Therapy - Clinical Statement Pt wife states that legs continue to improve.  Pt mentioned that home health services were coming to her home.  Therapist suggested home health take care of patients wounds as insurance will not pay for both and both patient and wife have difficulty with ambulation,(therapist had to get pt wheelchair out of the car and transport pt in and out of department).  Pt wife does not want this and states that she will cancel home health until her husbands wounds are healed.  Wounds over all appear to be healing.  Noted wound on left great toe that is open and draining.  Wife states that occurred when therapist was pushing foot into pt shoe after profore was applied.  Pt toe dressed this session.  Pt will continue to benefit from skilled therapy to promote healing environment.  During session pt began to pick at scabs with his fingernails therapist educated pt that there are many bacterial under our nail beds and this can cause infection.  PT verbalized understanding.    Wound Therapy - Functional Problem List pain, difficulty walking    Factors Delaying/Impairing Wound Healing Tobacco use;Diabetes Mellitus;Vascular compromise   Wound Therapy - Frequency Other  (comment)  2x/week    Wound Therapy - Current Recommendations PT   Wound Plan xeroform and medihoney, profore lite as tolerated                  PT Education - 01/08/16 1202    Education provided Yes   Education Details Pt to take dressing off if there is increased pain, Not to pick at scabs with fingernails.    Person(s) Educated Patient;Spouse   Methods Explanation   Comprehension Verbalized understanding          PT Short Term Goals - 01/08/16 1203    PT SHORT TERM GOAL #1   Title Patient will demonstrate slough 0% bilateral wounds in order to demonstrate resoluation of wound infection and improvements in healing status  Time 4   Period Weeks   Status Partially Met   PT SHORT TERM GOAL #2   Title Patient will demonstrate closure of both wounsd by at least 60% in order to demonstrate improved wound healing status    Time 4   Period Weeks   Status On-going   PT SHORT TERM GOAL #3   Title Patient's wife to be able to correctly identify signs/symptoms of wound infection and demonstrate proper wound bandaging in order to increase independence in managing condition    Time 4   Period Weeks   Status On-going           PT Long Term Goals - 01/08/16 1203    PT LONG TERM GOAL #1   Title Patient to demonstrate full wound closure bilaterally in order to demonstrate improved health condition and would healing    Time 8   Period Weeks   Status On-going   PT LONG TERM GOAL #2   Title Patient and his wife to be independent in exercises promoting bloodflow to lower extremities, including ankle pumps and toe/heel raises, in order to assist in reducing complications from vascualr status    Time 8   Period Weeks   Status On-going   PT LONG TERM GOAL #3   Title Patient and his wife to be independent in safety awareness and technique in order to avoid reinjury of B LEs in order to maintain good health status and to prevent injury    Time 8   Period Weeks   Status  On-going             Patient will benefit from skilled therapeutic intervention in order to improve the following deficits and impairments:     Visit Diagnosis: Leg wound, left, subsequent encounter  Leg wound, right, subsequent encounter  Difficulty in walking, not elsewhere classified     Problem List Patient Active Problem List   Diagnosis Date Noted  . CHF (congestive heart failure) ([place]) 04/18/2014  . COPD exacerbation ([place]) 04/18/2014  . Acute on chronic systolic heart failure ([place]) 10/14/2011  . Sinoatrial node dysfunction (HCC)   . TOBACCO USER 01/27/2010  . Coronary atherosclerosis 01/27/2010  . Cardiac pacemaker in situ 07/01/2009  . DIABETES MELLITUS 01/20/2009  . HYPERLIPIDEMIA 01/20/2009  . Essential hypertension 01/20/2009  . Atrial fibrillation ([place]) 01/20/2009  . ANEMIA, NORMOCYTIC 12/17/2008  . MELENA, HX OF 12/17/2008    Rayetta Humphrey, PT CLT [phone] 01/08/2016, 12:06 PM  [place] [address] [place], [place], [address] Phone: [phone]   Fax:  [phone]  Name: [name] MRN: [id number] Date of Birth: [birth year]

## 2016-01-11 ENCOUNTER — Ambulatory Visit (HOSPITAL_COMMUNITY): Payer: Medicare Other | Admitting: Physical Therapy

## 2016-01-11 DIAGNOSIS — S81802D Unspecified open wound, left lower leg, subsequent encounter: Secondary | ICD-10-CM | POA: Diagnosis not present

## 2016-01-11 DIAGNOSIS — R262 Difficulty in walking, not elsewhere classified: Secondary | ICD-10-CM

## 2016-01-11 DIAGNOSIS — S81801D Unspecified open wound, right lower leg, subsequent encounter: Secondary | ICD-10-CM

## 2016-01-11 NOTE — Therapy (Signed)
[place], [place], [address] Phone: [phone]   Fax:  [phone]  Physical Therapy Treatment  Patient Details  Name: [name] MRN: [id number] Date of Birth: [birth year] No Data Recorded  Encounter Date: 01/11/2016      PT End of Session - 01/11/16 1246    Visit Number 5   Number of Visits 16   Date for PT Re-Evaluation 01/26/16   Authorization Type Medicare/Tricare for life    Authorization Time Period 12/29/15 to 02/28/16   Authorization - Visit Number 5   Authorization - Number of Visits 10   PT Start Time 1031   PT Stop Time 1113   PT Time Calculation (min) 42 min   Activity Tolerance Patient tolerated treatment well   Behavior During Therapy [facility] for tasks assessed/performed      Past Medical History  Diagnosis Date  . Respiratory failure (HCC)     secondary to pleural effusion, severe COPD, ongoing tobacco abuse, and congestive heart failure)  . Heart block     with sinus pauses (status post St. Jude pacemaker by Dr. Caryl Comes)  . Chronic renal insufficiency   . Chronic atrial fibrillation (HCC)     (no Coumadin secondary to GI bleeding)  . Diabetes mellitus   . Coronary artery disease 2009    last catheterization in 2009 demonstrate a 3-vessel CAD . He underwent stenting with a right  coronary artery with a  PROMUS drug-eluting stent. He  also had a stenting of his circumflex with drug-eluting  stent previously))  . Cardiomyopathy     (EF 40%)  . Degenerative joint disease   . Hypertension   . Hyperlipidemia   . Peptic ulcer disease   . Peripheral vascular disease ([place])   . Sinoatrial node dysfunction [facility])     Past Surgical History  Procedure Laterality Date  . Cholecystectomy    . Insert / replace / remove pacemaker      St. Jude    There were no vitals filed for this visit.                     Wound Therapy - 01/11/16 1240    Subjective Patient reports his legs  are feeling good right now, no pain    Patient and Family Stated Goals wounds to heal    Prior Treatments MD has told them to use antibiotic cream and gauze    Pain Assessment No/denies pain   Evaluation and Treatment Procedures Explained to Patient/Family Yes   Evaluation and Treatment Procedures agreed to   Wound Properties Date First Assessed: 01/08/16 Time First Assessed: 4562 Wound Type: Degloving Location: Toe (Comment  which one) Location Orientation: Left Wound Description (Comments): great toe    Dressing Type Gauze (Comment);Silver hydrofiber   Dressing Changed Changed   Dressing Status Old drainage   Dressing Change Frequency Other (Comment)  2x/week    % Wound base Red or Granulating 100%   % Wound base Yellow 0%   Peri-wound Assessment Erythema (blanchable)   Drainage Amount Minimal   Drainage Description Serous   Treatment Cleansed;Tape changed;Packing (Impregnated strip);Other (Comment)  cleansed, xeroform, gauze/tape/netting    Wound Properties Date First Assessed: 12/29/15 Wound Type: Laceration Location: Tibial Location Orientation: Right Present on Admission: Yes   Dressing Type Other (Comment)   Dressing Changed Changed   Dressing Status Old drainage   Dressing Change Frequency Other (Comment)  2x/week    %  Wound base Red or Granulating 100%   % Wound base Yellow 0%   Peri-wound Assessment Intact;Pink;Other (Comment)   Margins Unattached edges (unapproximated)   Closure None   Drainage Amount Minimal   Drainage Description Serosanguineous   Treatment Cleansed;Packing (Impregnated strip);Other (Comment)  xeroform, profore lite    Wound Properties Date First Assessed: 12/29/15 Wound Type: Laceration Location: Tibial Location Orientation: Left   Dressing Type Other (Comment)   Dressing Changed Changed   Dressing Status Old drainage   Dressing Change Frequency Other (Comment)  2x/week    % Wound base Red or Granulating 90%   % Wound base Yellow 10%   Margins  Unattached edges (unapproximated)   Closure None   Drainage Amount Minimal   Drainage Description Serosanguineous   Treatment Cleansed;Packing (Impregnated strip);Other (Comment)  xeroform, profore lite    Wound Therapy - Clinical Statement Patient arrives reporting that his legs continue to feel better recently with no major complaints other than wanting wounds to heal so he can get away from compression wraps. Wounds on both tibias look excellent and appear to be close to reaching fully healed state. Wound on toe also appears much better however PT to formally take measures/send recert to MD next session so focus may be shifted to this area. Patient continues to appear to tolerate compression wraps well    Wound Therapy - Functional Problem List pain, difficulty walking    Factors Delaying/Impairing Wound Healing Tobacco use;Diabetes Mellitus;Vascular compromise   Wound Therapy - Frequency Other (comment)   Wound Therapy - Current Recommendations PT   Wound Plan xeroform and medihoney, profore lite as tolerated; measure great toe wound/send cert next session                   PT Education - 01/11/16 1246    Education provided No          PT Short Term Goals - 01/08/16 1203    PT SHORT TERM GOAL #1   Title Patient will demonstrate slough 0% bilateral wounds in order to demonstrate resoluation of wound infection and improvements in healing status    Time 4   Period Weeks   Status Partially Met   PT SHORT TERM GOAL #2   Title Patient will demonstrate closure of both wounsd by at least 60% in order to demonstrate improved wound healing status    Time 4   Period Weeks   Status On-going   PT SHORT TERM GOAL #3   Title Patient's wife to be able to correctly identify signs/symptoms of wound infection and demonstrate proper wound bandaging in order to increase independence in managing condition    Time 4   Period Weeks   Status On-going           PT Long Term Goals -  01/08/16 1203    PT LONG TERM GOAL #1   Title Patient to demonstrate full wound closure bilaterally in order to demonstrate improved health condition and would healing    Time 8   Period Weeks   Status On-going   PT LONG TERM GOAL #2   Title Patient and his wife to be independent in exercises promoting bloodflow to lower extremities, including ankle pumps and toe/heel raises, in order to assist in reducing complications from vascualr status    Time 8   Period Weeks   Status On-going   PT LONG TERM GOAL #3   Title Patient and his wife to be independent in safety awareness and  technique in order to avoid reinjury of B LEs in order to maintain good health status and to prevent injury    Time 8   Period Weeks   Status On-going             Patient will benefit from skilled therapeutic intervention in order to improve the following deficits and impairments:  Non-healing LE wounds  Visit Diagnosis: Leg wound, left, subsequent encounter  Leg wound, right, subsequent encounter  Difficulty in walking, not elsewhere classified     Problem List Patient Active Problem List   Diagnosis Date Noted  . CHF (congestive heart failure) ([place]) 04/18/2014  . COPD exacerbation ([place]) 04/18/2014  . Acute on chronic systolic heart failure ([place]) 10/14/2011  . Sinoatrial node dysfunction (HCC)   . TOBACCO USER 01/27/2010  . Coronary atherosclerosis 01/27/2010  . Cardiac pacemaker in situ 07/01/2009  . DIABETES MELLITUS 01/20/2009  . HYPERLIPIDEMIA 01/20/2009  . Essential hypertension 01/20/2009  . Atrial fibrillation ([place]) 01/20/2009  . ANEMIA, NORMOCYTIC 12/17/2008  . MELENA, HX OF 12/17/2008    Deniece Ree PT, DPT [place] [address] [place], [place], [address] Phone: [phone]   Fax:  [phone]  Name: ORIE BAXENDALE MRN: [id number] Date of Birth: [birth year]

## 2016-01-12 ENCOUNTER — Other Ambulatory Visit: Payer: Self-pay | Admitting: *Deleted

## 2016-01-12 NOTE — Patient Outreach (Signed)
[facility]) Care Management  01/12/2016  [name] April 10, 1928 [id number]  Referral from patient's daughter, patient is aware:   Telephone call to patient; left message on voice mail requesting return call.   Plan: Will follow up.  Colleen Can, RN BSN CCM Care Management Coordinator [facility] Care Management  [phone]

## 2016-01-13 ENCOUNTER — Ambulatory Visit (HOSPITAL_COMMUNITY): Payer: Medicare Other | Admitting: Physical Therapy

## 2016-01-13 ENCOUNTER — Encounter: Payer: Self-pay | Admitting: Internal Medicine

## 2016-01-13 ENCOUNTER — Ambulatory Visit (INDEPENDENT_AMBULATORY_CARE_PROVIDER_SITE_OTHER): Payer: Medicare Other | Admitting: Internal Medicine

## 2016-01-13 VITALS — BP 110/60 | HR 65 | Ht 69.0 in | Wt 181.0 lb

## 2016-01-13 DIAGNOSIS — I482 Chronic atrial fibrillation, unspecified: Secondary | ICD-10-CM

## 2016-01-13 DIAGNOSIS — S81802D Unspecified open wound, left lower leg, subsequent encounter: Secondary | ICD-10-CM | POA: Diagnosis not present

## 2016-01-13 DIAGNOSIS — S81801S Unspecified open wound, right lower leg, sequela: Secondary | ICD-10-CM

## 2016-01-13 DIAGNOSIS — S81802S Unspecified open wound, left lower leg, sequela: Secondary | ICD-10-CM

## 2016-01-13 LAB — CUP PACEART INCLINIC DEVICE CHECK
Battery Remaining Longevity: 84 mo
Battery Voltage: 2.79 V
Brady Statistic RV Percent Paced: 95 %
Date Time Interrogation Session: 20170413104340
Implantable Lead Implant Date: 20100609
Implantable Lead Location: 753860
Lead Channel Pacing Threshold Amplitude: 0.5 V
Lead Channel Pacing Threshold Pulse Width: 0.4 ms
Lead Channel Setting Pacing Amplitude: 2.5 V
Lead Channel Setting Pacing Pulse Width: 0.4 ms
Lead Channel Setting Sensing Sensitivity: 2 mV
MDC IDC MSMT LEADCHNL RV IMPEDANCE VALUE: 437 Ohm
MDC IDC MSMT LEADCHNL RV SENSING INTR AMPL: 12 mV
Pulse Gen Model: 5626
Pulse Gen Serial Number: 2164677

## 2016-01-13 NOTE — Patient Instructions (Signed)
Your physician wants you to follow-up in: 1 Year with Dr. Taylor. You will receive a reminder letter in the mail two months in advance. If you don't receive a letter, please call our office to schedule the follow-up appointment.  Your physician recommends that you schedule a follow-up appointment in: 6 Months in the Device Clinic.  Your physician recommends that you continue on your current medications as directed. Please refer to the Current Medication list given to you today.  If you need a refill on your cardiac medications before your next appointment, please call your pharmacy.  Thank you for choosing [facility]!    

## 2016-01-13 NOTE — Therapy (Signed)
[place], [place], [address] Phone: [phone]   Fax:  [phone]  Wound Care Therapy (cert/codes done)  Patient Details  Name: [name] MRN: [id number] Date of Birth: 05/16/1928 No Data Recorded  Encounter Date: 01/13/2016      PT End of Session - 01/13/16 1241    Visit Number 6   Number of Visits 16   Date for PT Re-Evaluation 01/26/16   Authorization Type Medicare/Tricare for life    Authorization Time Period 12/29/15 to 02/28/16   Authorization - Visit Number 6   Authorization - Number of Visits 10   PT Start Time 1030   PT Stop Time 1115   PT Time Calculation (min) 45 min   Activity Tolerance Patient tolerated treatment well   Behavior During Therapy [facility] for tasks assessed/performed      Past Medical History  Diagnosis Date  . Respiratory failure (HCC)     secondary to pleural effusion, severe COPD, ongoing tobacco abuse, and congestive heart failure)  . Heart block     with sinus pauses (status post St. Jude pacemaker by Dr. Caryl Comes)  . Chronic renal insufficiency   . Chronic atrial fibrillation (HCC)     (no Coumadin secondary to GI bleeding)  . Diabetes mellitus   . Coronary artery disease 2009    last catheterization in 2009 demonstrate a 3-vessel CAD . He underwent stenting with a right  coronary artery with a  PROMUS drug-eluting stent. He  also had a stenting of his circumflex with drug-eluting  stent previously))  . Cardiomyopathy     (EF 40%)  . Degenerative joint disease   . Hypertension   . Hyperlipidemia   . Peptic ulcer disease   . Peripheral vascular disease ([place])   . Sinoatrial node dysfunction [facility])     Past Surgical History  Procedure Laterality Date  . Cholecystectomy    . Insert / replace / remove pacemaker      St. Jude    There were no vitals filed for this visit.                  Wound Therapy - 01/13/16 1231    Subjective Patient reports that  profore was a little too tight this time but no pain or problems; no pain today in genearl    Patient and Family Stated Goals wounds to heal    Prior Treatments MD has told them to use antibiotic cream and gauze    Pain Assessment No/denies pain   Evaluation and Treatment Procedures Explained to Patient/Family Yes   Evaluation and Treatment Procedures agreed to   Wound Properties Date First Assessed: 01/08/16 Time First Assessed: 1040 Wound Type: Degloving Location: Toe (Comment  which one) Location Orientation: Left Wound Description (Comments): great toe    Dressing Type None   Dressing Changed New   Dressing Status None   Dressing Change Frequency Other (Comment)  2x/week    % Wound base Red or Granulating 100%   % Wound base Yellow 0%   Peri-wound Assessment Erythema (blanchable)   Wound Length (cm) 4 cm   Wound Width (cm) 2 cm   Wound Depth (cm) 0 cm   Drainage Amount Minimal   Drainage Description Serous   Treatment Cleansed;Other (Comment)  xeroform, gauze    Wound Properties Date First Assessed: 12/29/15 Wound Type: Laceration Location: Tibial Location Orientation: Right Present on Admission: Yes   Dressing Type Other (Comment)  Dressing Changed Changed   Dressing Status Old drainage   Dressing Change Frequency Other (Comment)  2x/week    % Wound base Red or Granulating 100%   % Wound base Yellow 0%   Peri-wound Assessment Intact;Pink;Other (Comment)   Margins Unattached edges (unapproximated)   Closure None   Drainage Amount None   Drainage Description No odor   Treatment Cleansed;Debridement (Selective);Other (Comment)  cleansed, debrided dry skin, xeroform, gauze wrap    Wound Properties Date First Assessed: 12/29/15 Wound Type: Laceration Location: Tibial Location Orientation: Left   Dressing Type Other (Comment)   Dressing Changed Changed   Dressing Status Old drainage   Dressing Change Frequency Other (Comment)  2x/week    % Wound base Red or Granulating 100%    % Wound base Yellow 0%   Margins Unattached edges (unapproximated)   Closure None   Drainage Amount None   Drainage Description No odor   Treatment Cleansed;Debridement (Selective);Other (Comment)  cleansed, debrided dry skin, xeroform, gauze wrap    Selective Debridement - Location B LE wounds    Selective Debridement - Tools Used Forceps;Scalpel   Selective Debridement - Tissue Removed dry skin    Wound Therapy - Clinical Statement Patient arrives today reporting no pain but that wraps feel "a little tight, but no problems". Upon removal of profores tibial wounds look excellent. Dry skin debrided and switched to xeroform with gauze wrapping today. Will plan to send cert including toe wound also with new codes to MD today as well. Overall patient doing very well!     Wound Therapy - Functional Problem List pain, difficulty walking    Factors Delaying/Impairing Wound Healing Tobacco use;Diabetes Mellitus;Vascular compromise   Wound Therapy - Frequency Other (comment)   Wound Therapy - Current Recommendations PT   Wound Plan xeroform/gauze to LEs; dressings as appropraite to toe                 PT Education - 01/13/16 1240    Education provided Yes   Education Details condition of wounds, encouraged to not pick at wounds    Person(s) Educated Patient;Spouse   Methods Explanation   Comprehension Verbalized understanding          PT Short Term Goals - 01/08/16 1203    PT SHORT TERM GOAL #1   Title Patient will demonstrate slough 0% bilateral wounds in order to demonstrate resoluation of wound infection and improvements in healing status    Time 4   Period Weeks   Status Partially Met   PT SHORT TERM GOAL #2   Title Patient will demonstrate closure of both wounsd by at least 60% in order to demonstrate improved wound healing status    Time 4   Period Weeks   Status On-going   PT SHORT TERM GOAL #3   Title Patient's wife to be able to correctly identify signs/symptoms  of wound infection and demonstrate proper wound bandaging in order to increase independence in managing condition    Time 4   Period Weeks   Status On-going           PT Long Term Goals - 01/08/16 1203    PT [place] #1   Title Patient to demonstrate full wound closure bilaterally in order to demonstrate improved health condition and would healing    Time 8   Period Weeks   Status On-going   PT LONG TERM GOAL #2   Title Patient and his wife to be independent in  exercises promoting bloodflow to lower extremities, including ankle pumps and toe/heel raises, in order to assist in reducing complications from vascualr status    Time 8   Period Weeks   Status On-going   PT LONG TERM GOAL #3   Title Patient and his wife to be independent in safety awareness and technique in order to avoid reinjury of B LEs in order to maintain good health status and to prevent injury    Time 8   Period Weeks   Status On-going             Patient will benefit from skilled therapeutic intervention in order to improve the following deficits and impairments:     Visit Diagnosis: Unspecified open wound, left lower leg, sequela - Plan: PT plan of care cert/re-cert  Unspecified open wound, right lower leg, sequela - Plan: PT plan of care cert/re-cert     Problem List Patient Active Problem List   Diagnosis Date Noted  . CHF (congestive heart failure) ([place]) 04/18/2014  . COPD exacerbation ([place]) 04/18/2014  . Acute on chronic systolic heart failure ([place]) 10/14/2011  . Sinoatrial node dysfunction (HCC)   . TOBACCO USER 01/27/2010  . Coronary atherosclerosis 01/27/2010  . Cardiac pacemaker in situ 07/01/2009  . DIABETES MELLITUS 01/20/2009  . HYPERLIPIDEMIA 01/20/2009  . Essential hypertension 01/20/2009  . Atrial fibrillation ([place]) 01/20/2009  . ANEMIA, NORMOCYTIC 12/17/2008  . MELENA, HX OF 12/17/2008    Deniece Ree PT, DPT [place] [address] [place], [place], [address] Phone: [phone]   Fax:  [phone]  Name: [name] MRN: [id number] Date of Birth: [birth year]

## 2016-01-13 NOTE — Progress Notes (Signed)
HPI Mr. Bookwalter returns today for followup. He is a very pleasant 82 year old man with a history of atrial fibrillation, COPD, ongoing tobacco abuse, symptomatic bradycardia, status post permanent pacemaker insertion. He is not thought to be an anticoagulation candidate secondary to gastrointestinal bleeding. Since I saw him last a year ago, he has gradually developed more sob and fatigue but has had no chest pain. He denies peripheral edema but his legs are wrapped due to venous insufficiency and a remote DVT. He has chronic dyspnea. Allergies  Allergen Reactions  . Albuterol     REACTION: agitation  . Statins   . Sulfonamide Derivatives Other (See Comments)    unknown     Current Outpatient Prescriptions  Medication Sig Dispense Refill  . aspirin 81 MG tablet Take 81 mg by mouth daily.      Marland Kitchen ezetimibe (ZETIA) 10 MG tablet Take 10 mg by mouth daily.      Marland Kitchen glyBURIDE (DIABETA) 5 MG tablet Take 10 mg by mouth 2 (two) times daily with a meal.     . hydrALAZINE (APRESOLINE) 25 MG tablet 25 mg. Take 1/2 tablet by mouth three times daily    . isosorbide mononitrate (IMDUR) 30 MG 24 hr tablet Take 30 mg by mouth 2 (two) times daily.      . miglitol (GLYSET) 50 MG tablet Take 100 mg by mouth 3 (three) times daily.      No current facility-administered medications for this visit.     Past Medical History  Diagnosis Date  . Respiratory failure (HCC)     secondary to pleural effusion, severe COPD, ongoing tobacco abuse, and congestive heart failure)  . Heart block     with sinus pauses (status post St. Jude pacemaker by Dr. Graciela Husbands)  . Chronic renal insufficiency   . Chronic atrial fibrillation (HCC)     (no Coumadin secondary to GI bleeding)  . Diabetes mellitus   . Coronary artery disease 2009    last catheterization in 2009 demonstrate a 3-vessel CAD . He underwent stenting with a right  coronary artery with a  PROMUS drug-eluting stent. He  also had a stenting of his circumflex with  drug-eluting  stent previously))  . Cardiomyopathy     (EF 40%)  . Degenerative joint disease   . Hypertension   . Hyperlipidemia   . Peptic ulcer disease   . Peripheral vascular disease (HCC)   . Sinoatrial node dysfunction (HCC)     ROS:   All systems reviewed and negative except as noted in the HPI.   Past Surgical History  Procedure Laterality Date  . Cholecystectomy    . Insert / replace / remove pacemaker      St. Jude     Family History  Problem Relation Age of Onset  . Coronary artery disease Neg Hx      Social History   Social History  . Marital Status: Married    Spouse Name: N/A  . Number of Children: N/A  . Years of Education: N/A   Occupational History  . retired     Hotel manager   Social History Main Topics  . Smoking status: Former Smoker -- 1.50 packs/day for 60 years    Types: Cigarettes    Quit date: 06/15/2007  . Smokeless tobacco: Not on file  . Alcohol Use: 0.0 oz/week    0 Standard drinks or equivalent per week     Comment: occasionally have "a beer"  . Drug Use: Not on file  .  Sexual Activity: Not on file   Other Topics Concern  . Not on file   Social History Narrative     BP 110/60 mmHg  Pulse 65  Ht 5\' 9"  (1.753 m)  Wt 181 lb (82.101 kg)  BMI 26.72 kg/m2  SpO2 99%  Physical Exam:  Chronically ill appearing 82 year old man, NAD HEENT: Unremarkable Neck:  6 cm JVD, no thyromegally, carotids without bruit Lungs:  Clear except for scattered rales in the bases bilaterally. Scant expiratory wheezes HEART:  IRegular rate rhythm, no murmurs, no rubs, no clicks Abd:  soft, positive bowel sounds, no organomegally, no rebound, no guarding Ext:  2 plus pulses, no edema, no cyanosis, no clubbing, legs are wrapped Skin:  No rashes no nodules Neuro:  CN II through XII intact, motor grossly intact  DEVICE  Normal device function.  See PaceArt for details.   Assess/Plan:  1. PPM - his St. Jude VVI PM is working normally. Will  recheck in several months. 2. HTN - his blood pressure is well controlled.  3. DVT - he has minimal residual pain. He is not a candidate for anti-coag due to GI bleeding. 4. Chronic atrial fib - his ventricular rate is well controlled. Will follow.  Leonia Reeves.D.

## 2016-01-14 ENCOUNTER — Other Ambulatory Visit: Payer: Self-pay | Admitting: *Deleted

## 2016-01-14 NOTE — Patient Outreach (Signed)
[facility]) Care Management  [facility] Care Manager  01/14/2016   [name] [birth year] [id number]  Referral from patient's daughter:  ,  Subjective:   Telephone call to patient; patient's spouse answered call and advised that she would need to take call because patient was very hard of hearing. Marland Kitchen She was advised of referral and of [facility] care management services.  Spouse of patient advised that referral made by mistake because she nor spouse needed nurse services. States she only needed someone for light cleaning & help with bath several times a week.  States she had discussed with daughter and made her aware of the mistake. She did agree to take brief screening assessment for patient.. States patient has diabetes and heart failure which are under control. States spouse weighs daily and if he weighs 2-3 lbs. over she gives extra fluid pill if directed by his MD.knows when to call 911. States she manages patient's medication and he takes as directed by MD consistently.  States patient refuses to use glucometer to check blood sugars. States he has A1C levels done at MD office and last one was within normal limits. Patient's spouse is declining [facility] care management services including Health Coach services. States just needs assistance by aid to help with shower or bath 1-2 times weekly.   Objective: see med list as noted.  Encounter Medications:  Outpatient Encounter Prescriptions as of 01/14/2016  Medication Sig  . aspirin 81 MG tablet Take 81 mg by mouth daily.    Marland Kitchen ezetimibe (ZETIA) 10 MG tablet Take 10 mg by mouth daily.    Marland Kitchen glyBURIDE (DIABETA) 5 MG tablet Take 10 mg by mouth 2 (two) times daily with a meal.   . hydrALAZINE (APRESOLINE) 25 MG tablet 25 mg. Take 1/2 tablet by mouth three times daily  . isosorbide mononitrate (IMDUR) 30 MG 24 hr tablet Take 30 mg by mouth 2 (two) times daily.    . miglitol (GLYSET) 50 MG tablet Take 100 mg by mouth 3 (three) times daily.    No  facility-administered encounter medications on file as of 01/14/2016.    Functional Status:  Able to do personal care.  Unable to get in or out of shower/tub independently.  Uses sink basin to wash off.  Using walker or cane to assist with walking.  Has wheelchair if needed.  Patient is able to drive and drives self to MD office. Spouse does accompany him. Fall; No falls in past year.  Depression Screening: Unable to assess. Spouse took call for patient.  Assessment:  Medical conditions under control. No problems with medications or transportation.  Needs aid to assist with shower or tub bath 1-2 times weekly. Willing to do self pay for aid help.   Plan: Will send list of personal care agencies to patient. Spouse understands that she will need to call to advise of services needed in order to set up services & obtain payment schedule. Send [facility] contact information to patient. Close out case.   Colleen Can, RN BSN CCM Care Management Coordinator [facility] Care Management  [phone]

## 2016-01-18 ENCOUNTER — Ambulatory Visit (HOSPITAL_COMMUNITY): Payer: Medicare Other | Admitting: Physical Therapy

## 2016-01-18 ENCOUNTER — Encounter: Payer: Self-pay | Admitting: *Deleted

## 2016-01-18 DIAGNOSIS — S81802S Unspecified open wound, left lower leg, sequela: Secondary | ICD-10-CM

## 2016-01-18 DIAGNOSIS — S81801S Unspecified open wound, right lower leg, sequela: Secondary | ICD-10-CM

## 2016-01-18 NOTE — Therapy (Signed)
[place], [place], [address] Phone: [phone]   Fax:  [phone]  Wound Care Therapy  Patient Details  Name: [name] MRN: [id number] Date of Birth: September 16, 1928 No Data Recorded  Encounter Date: 01/18/2016      PT End of Session - 01/18/16 1214    Visit Number 7   Number of Visits 16   Date for PT Re-Evaluation 01/26/16   Authorization Type Medicare/Tricare for life    Authorization Time Period 12/29/15 to 02/28/16   Authorization - Visit Number 7   Authorization - Number of Visits 10   PT Start Time 1116   PT Stop Time 1200   PT Time Calculation (min) 44 min   Activity Tolerance Patient tolerated treatment well   Behavior During Therapy [facility] for tasks assessed/performed      Past Medical History  Diagnosis Date  . Respiratory failure (HCC)     secondary to pleural effusion, severe COPD, ongoing tobacco abuse, and congestive heart failure)  . Heart block     with sinus pauses (status post St. Jude pacemaker by Dr. Caryl Comes)  . Chronic renal insufficiency   . Chronic atrial fibrillation (HCC)     (no Coumadin secondary to GI bleeding)  . Diabetes mellitus   . Coronary artery disease 2009    last catheterization in 2009 demonstrate a 3-vessel CAD . He underwent stenting with a right  coronary artery with a  PROMUS drug-eluting stent. He  also had a stenting of his circumflex with drug-eluting  stent previously))  . Cardiomyopathy     (EF 40%)  . Degenerative joint disease   . Hypertension   . Hyperlipidemia   . Peptic ulcer disease   . Peripheral vascular disease ([place])   . Sinoatrial node dysfunction [facility])     Past Surgical History  Procedure Laterality Date  . Cholecystectomy    . Insert / replace / remove pacemaker      St. Jude    There were no vitals filed for this visit.                  Wound Therapy - 01/18/16 1206    Subjective Patient arrived with his wife and  daughter today, reporting legs are feeling good but gauze had come off so they tried taping it some more. NO pain today.    Patient and Family Stated Goals wounds to heal    Prior Treatments MD has told them to use antibiotic cream and gauze    Pain Assessment No/denies pain   Evaluation and Treatment Procedures Explained to Patient/Family Yes   Evaluation and Treatment Procedures agreed to   Wound Properties Date First Assessed: 01/08/16 Time First Assessed: 5035 Wound Type: Degloving Location: Toe (Comment  which one) Location Orientation: Left Wound Description (Comments): great toe    Dressing Type Impregnated gauze (bismuth);Gauze (Comment);Other (Comment)  xeroform, gauze, netting, tape    Dressing Changed Changed   Dressing Status Old drainage   Dressing Change Frequency Other (Comment)  2x/week    % Wound base Red or Granulating 100%   % Wound base Yellow 0%   Peri-wound Assessment Erythema (blanchable)   Drainage Amount Scant   Drainage Description Serous   Treatment Cleansed;Debridement (Selective);Packing (Impregnated strip);Other (Comment)  xeroform, gauze, tape    Wound Properties Date First Assessed: 12/29/15 Wound Type: Laceration Location: Tibial Location Orientation: Right Present on Admission: Yes   Dressing Type Other (Comment)  Dressing Changed Changed   Dressing Status Clean;Dry   Dressing Change Frequency Other (Comment)  2x/week    Site / Wound Assessment Other (Comment)  appears 99% healed    Peri-wound Assessment Intact;Pink   Margins Attached edges (approximated)   Closure None   Drainage Amount None   Drainage Description No odor   Treatment Cleansed;Other (Comment)  appears 99% healed; cleansed and DC-ing this wound today    Wound Properties Date First Assessed: 12/29/15 Wound Type: Laceration Location: Tibial Location Orientation: Left   Dressing Type Gauze (Comment);Impregnated gauze (bismuth);Other (Comment)   Dressing Changed Changed   Dressing  Status Clean;Dry   Dressing Change Frequency Other (Comment)  2x/week    % Wound base Red or Granulating 100%   % Wound base Yellow 0%   Margins Attached edges (approximated)   Closure None   Drainage Amount None   Drainage Description No odor   Treatment Cleansed;Debridement (Selective);Packing (Impregnated strip);Other (Comment)  xeroform, gauze, tape, netting    Selective Debridement - Location R great toe and L anterior tibia    Selective Debridement - Tools Used Forceps;Scalpel   Selective Debridement - Tissue Removed dry and dead skin    Wound Therapy - Clinical Statement Patient arrives doing well today, wife and daughter present. R LE appears 99% healed at this time and PT is discharging this wound from skilled services; gave wife/daughter some xeroform and advised to keep placing over this area to facilitate healing of last small area. Continued to debride dead/dry skin from L tibial wound which is also doing well and expect this wound to likely be discharged within the next few days as well. R great toe wound appears well but does have quite a bit of dry/dead skin imapirng healing, some of which was debrided today.    Wound Therapy - Functional Problem List pain, difficulty walking    Factors Delaying/Impairing Wound Healing Tobacco use;Diabetes Mellitus;Vascular compromise   Wound Therapy - Frequency Other (comment)   Wound Therapy - Current Recommendations PT   Wound Plan possible DC of L tibial wound; continue dressing R great toe with xeroform , debrding PRN                  PT Education - 01/18/16 1213    Education provided Yes   Education Details xeroform for use at home, what to do if bandage comes off R great toe    Person(s) Educated Patient   Methods Explanation   Comprehension Verbalized understanding          PT Short Term Goals - 01/08/16 1203    PT SHORT TERM GOAL #1   Title Patient will demonstrate slough 0% bilateral wounds in order to  demonstrate resoluation of wound infection and improvements in healing status    Time 4   Period Weeks   Status Partially Met   PT SHORT TERM GOAL #2   Title Patient will demonstrate closure of both wounsd by at least 60% in order to demonstrate improved wound healing status    Time 4   Period Weeks   Status On-going   PT SHORT TERM GOAL #3   Title Patient's wife to be able to correctly identify signs/symptoms of wound infection and demonstrate proper wound bandaging in order to increase independence in managing condition    Time 4   Period Weeks   Status On-going           PT Long Term Goals - 01/08/16 1203  PT LONG TERM GOAL #1   Title Patient to demonstrate full wound closure bilaterally in order to demonstrate improved health condition and would healing    Time 8   Period Weeks   Status On-going   PT LONG TERM GOAL #2   Title Patient and his wife to be independent in exercises promoting bloodflow to lower extremities, including ankle pumps and toe/heel raises, in order to assist in reducing complications from vascualr status    Time 8   Period Weeks   Status On-going   PT LONG TERM GOAL #3   Title Patient and his wife to be independent in safety awareness and technique in order to avoid reinjury of B LEs in order to maintain good health status and to prevent injury    Time 8   Period Weeks   Status On-going             Patient will benefit from skilled therapeutic intervention in order to improve the following deficits and impairments:     Visit Diagnosis: Unspecified open wound, left lower leg, sequela  Unspecified open wound, right lower leg, sequela     Problem List Patient Active Problem List   Diagnosis Date Noted  . CHF (congestive heart failure) ([place]) 04/18/2014  . COPD exacerbation ([place]) 04/18/2014  . Acute on chronic systolic heart failure ([place]) 10/14/2011  . Sinoatrial node dysfunction (HCC)   . TOBACCO USER 01/27/2010  . Coronary  atherosclerosis 01/27/2010  . Cardiac pacemaker in situ 07/01/2009  . DIABETES MELLITUS 01/20/2009  . HYPERLIPIDEMIA 01/20/2009  . Essential hypertension 01/20/2009  . Atrial fibrillation ([place]) 01/20/2009  . ANEMIA, NORMOCYTIC 12/17/2008  . MELENA, HX OF 12/17/2008    Deniece Ree PT, DPT [facility] [address] [place], [place], [address] Phone: [phone]   Fax:  [phone]  Name: [name] MRN: [id number] Date of Birth: December 15, 1927

## 2016-01-19 ENCOUNTER — Inpatient Hospital Stay (HOSPITAL_COMMUNITY)
Admission: EM | Admit: 2016-01-19 | Discharge: 2016-01-21 | DRG: 190 | Disposition: A | Discharge status: Home / Self Care | Payer: Medicare Other | Attending: Internal Medicine | Admitting: Internal Medicine

## 2016-01-19 ENCOUNTER — Emergency Department (HOSPITAL_COMMUNITY): Payer: Medicare Other

## 2016-01-19 ENCOUNTER — Encounter (HOSPITAL_COMMUNITY): Payer: Self-pay | Admitting: *Deleted

## 2016-01-19 DIAGNOSIS — I10 Essential (primary) hypertension: Secondary | ICD-10-CM | POA: Diagnosis present

## 2016-01-19 DIAGNOSIS — Z7982 Long term (current) use of aspirin: Secondary | ICD-10-CM

## 2016-01-19 DIAGNOSIS — N183 Chronic kidney disease, stage 3 unspecified: Secondary | ICD-10-CM | POA: Diagnosis present

## 2016-01-19 DIAGNOSIS — I509 Heart failure, unspecified: Secondary | ICD-10-CM

## 2016-01-19 DIAGNOSIS — J441 Chronic obstructive pulmonary disease with (acute) exacerbation: Secondary | ICD-10-CM | POA: Diagnosis not present

## 2016-01-19 DIAGNOSIS — I248 Other forms of acute ischemic heart disease: Secondary | ICD-10-CM | POA: Diagnosis present

## 2016-01-19 DIAGNOSIS — I5022 Chronic systolic (congestive) heart failure: Secondary | ICD-10-CM | POA: Diagnosis present

## 2016-01-19 DIAGNOSIS — E1122 Type 2 diabetes mellitus with diabetic chronic kidney disease: Secondary | ICD-10-CM | POA: Diagnosis present

## 2016-01-19 DIAGNOSIS — E785 Hyperlipidemia, unspecified: Secondary | ICD-10-CM | POA: Diagnosis present

## 2016-01-19 DIAGNOSIS — Z87891 Personal history of nicotine dependence: Secondary | ICD-10-CM

## 2016-01-19 DIAGNOSIS — Z7984 Long term (current) use of oral hypoglycemic drugs: Secondary | ICD-10-CM

## 2016-01-19 DIAGNOSIS — I4891 Unspecified atrial fibrillation: Secondary | ICD-10-CM | POA: Diagnosis present

## 2016-01-19 DIAGNOSIS — J9601 Acute respiratory failure with hypoxia: Secondary | ICD-10-CM | POA: Diagnosis present

## 2016-01-19 DIAGNOSIS — Z950 Presence of cardiac pacemaker: Secondary | ICD-10-CM | POA: Diagnosis present

## 2016-01-19 DIAGNOSIS — I130 Hypertensive heart and chronic kidney disease with heart failure and stage 1 through stage 4 chronic kidney disease, or unspecified chronic kidney disease: Secondary | ICD-10-CM | POA: Diagnosis present

## 2016-01-19 MED ORDER — LEVALBUTEROL HCL 1.25 MG/0.5ML IN NEBU
1.2500 mg | INHALATION_SOLUTION | RESPIRATORY_TRACT | Status: DC
  Administered 2016-01-20 (×2): 1.25 mg via RESPIRATORY_TRACT
  Filled 2016-01-19 (×3): qty 0.5

## 2016-01-19 MED ORDER — METHYLPREDNISOLONE SODIUM SUCC 125 MG IJ SOLR
125.0000 mg | Freq: Once | INTRAMUSCULAR | Status: AC
  Administered 2016-01-20: 125 mg via INTRAVENOUS
  Filled 2016-01-19: qty 2

## 2016-01-19 MED ORDER — IPRATROPIUM BROMIDE 0.02 % IN SOLN
0.5000 mg | RESPIRATORY_TRACT | Status: DC
  Administered 2016-01-20 (×2): 0.5 mg via RESPIRATORY_TRACT
  Filled 2016-01-19 (×3): qty 2.5

## 2016-01-19 NOTE — ED Provider Notes (Signed)
By signing my name below, I, Iona Beard, attest that this documentation has been prepared under the direction and in the presence of Manette Doto N Niharika Savino, DO.   Electronically Signed: Iona Beard, ED Scribe. 01/20/2016. 1:01 AM  TIME SEEN: 11:38 PM  CHIEF COMPLAINT: Shortness of Breath  HPI: HPI Comments: DEQUANE GOBEIL is a 82 y.o. male with PMHx of heart block s/p pacemaker placement, COPD, CAD, atrial fibrillation no longer on anticoagulation and CHF who presents to the Emergency Department complaining of gradual onset, shortness of breath, ongoing for three days. Pt reports associated wheezing, bilateral leg swelling, and productive cough. No other associated symptoms noted. No worsening or alleviating factors noted. Pt denies fever, chest pain, or any other pertinent symptoms. Pt uses an inhaler at home. Pt is a non-smoker x1.5 years. He does not wear oxygen at home. PCP is Dr.Lawrence Fusco.    ROS: See HPI Constitutional: no fever  Eyes: no drainage  ENT: no runny nose   Cardiovascular:  no chest pain  Resp:  Shortness of breath GI: no vomiting GU: no dysuria Integumentary: no rash  Allergy: no hives  Musculoskeletal:  Neurological: no slurred speech ROS otherwise negative  PAST MEDICAL HISTORY/PAST SURGICAL HISTORY:  Past Medical History  Diagnosis Date  . Respiratory failure (HCC)     secondary to pleural effusion, severe COPD, ongoing tobacco abuse, and congestive heart failure)  . Heart block     with sinus pauses (status post St. Jude pacemaker by Dr. Graciela Husbands)  . Chronic renal insufficiency   . Chronic atrial fibrillation (HCC)     (no Coumadin secondary to GI bleeding)  . Diabetes mellitus   . Coronary artery disease 2009    last catheterization in 2009 demonstrate a 3-vessel CAD . He underwent stenting with a right  coronary artery with a  PROMUS drug-eluting stent. He  also had a stenting of his circumflex with drug-eluting  stent previously))  .  Cardiomyopathy     (EF 40%)  . Degenerative joint disease   . Hypertension   . Hyperlipidemia   . Peptic ulcer disease   . Peripheral vascular disease (HCC)   . Sinoatrial node dysfunction (HCC)     MEDICATIONS:  Prior to Admission medications   Medication Sig Start Date End Date Taking? Authorizing Provider  glyBURIDE (DIABETA) 5 MG tablet Take 10 mg by mouth 2 (two) times daily with a meal.    Yes Historical Provider, MD  aspirin 81 MG tablet Take 81 mg by mouth daily.      Historical Provider, MD  ezetimibe (ZETIA) 10 MG tablet Take 10 mg by mouth daily.      Historical Provider, MD  hydrALAZINE (APRESOLINE) 25 MG tablet 25 mg. Take 1/2 tablet by mouth three times daily    Historical Provider, MD  isosorbide mononitrate (IMDUR) 30 MG 24 hr tablet Take 30 mg by mouth 2 (two) times daily.      Historical Provider, MD  miglitol (GLYSET) 50 MG tablet Take 100 mg by mouth 3 (three) times daily.     Historical Provider, MD    ALLERGIES:  Allergies  Allergen Reactions  . Albuterol     REACTION: agitation  . Statins   . Sulfonamide Derivatives Other (See Comments)    unknown    SOCIAL HISTORY:  Social History  Substance Use Topics  . Smoking status: Former Smoker -- 1.50 packs/day for 60 years    Types: Cigarettes    Quit date: 06/15/2007  .  Smokeless tobacco: Not on file  . Alcohol Use: 0.0 oz/week    0 Standard drinks or equivalent per week     Comment: occasionally have "a beer"    FAMILY HISTORY: Family History  Problem Relation Age of Onset  . Coronary artery disease Neg Hx     EXAM: BP 170/50 mmHg  Pulse 24  Resp 24  Ht  (1.778 m)  Wt 179 lb (81.194 kg)  BMI 25.68 kg/m2  SpO2 99% CONSTITUTIONAL: Alert and oriented and responds appropriately to questions. Elderly and chronically ill appearing.  HEAD: Normocephalic EYES: Conjunctivae clear, PERRL ENT: normal nose; no rhinorrhea; moist mucous membranes; pharynx without lesions noted NECK: Supple, no  meningismus, no LAD  CARD: RRR; S1 and S2 appreciated; no murmurs, no clicks, no rubs, no gallops RESP: tachypnic, speaking in short sentences, diffuse rhonchus breath sounds, expiratory wheezing, diminished breath sounds to bilateral bases, no significant respiratory distress ABD/GI: Normal bowel sounds; non-distended; soft, non-tender, no rebound, no guarding BACK:  The back appears normal and is non-tender to palpation, there is no CVA tenderness EXT: Normal ROM in all joints; non-tender to palpation; ; normal capillary refill; no cyanosis; +1 bilateral pitting edema to mid calf  SKIN: Normal color for age and race; warm NEURO: Moves all extremities equally PSYCH: The patient's mood and manner are appropriate. Grooming and personal hygiene are appropriate.  MEDICAL DECISION MAKING: Patient here with COPD exacerbation. No significant respiratory distress. Received albuterol with EMS. We'll give Xopenex, Atrovent, Solu-Medrol. We'll obtain labs, chest x-ray.  ED PROGRESS: Patient's lines have improved aeration after 2 Xopenex treatments in the emergency department. We'll give him a continuous Xopenex. Chest x-ray shows mild cardiac enlargement. BNP is 224. Troponin is mildly elevated at 0.05. Will admit to medicine for COPD exacerbation.  1:40 AM  Discussed patient's case with hospitalist, Dr. Onalee Hua.  Recommend admission to medical, observation bed.  I will place holding orders per their request. Patient and family (if present) updated with plan. Care transferred to hospitalist service.  I reviewed all nursing notes, vitals, pertinent old records, EKGs, labs, imaging (as available).   EKG Interpretation  Date/Time:  Tuesday January 19 2016 23:26:13 EDT Ventricular Rate:  65 PR Interval:  88 QRS Duration: 160 QT Interval:  483 QTC Calculation: 502 R Axis:   -82 Text Interpretation:  Ventricular-paced rhythm No further analysis attempted due to paced rhythm Confirmed by Evon Lopezperez,  DO, Vernell Townley  ([id number]) on 01/20/2016 12:21:21 AM      CRITICAL CARE Performed by: Raelyn Number   Total critical care time: 35 minutes  Critical care time was exclusive of separately billable procedures and treating other patients.  Critical care was necessary to treat or prevent imminent or life-threatening deterioration.  Critical care was time spent personally by me on the following activities: development of treatment plan with patient and/or surrogate as well as nursing, discussions with consultants, evaluation of patient's response to treatment, examination of patient, obtaining history from patient or surrogate, ordering and performing treatments and interventions, ordering and review of laboratory studies, ordering and review of radiographic studies, pulse oximetry and re-evaluation of patient's condition.   I personally performed the services described in this documentation, which was scribed in my presence. The recorded information has been reviewed and is accurate.    Layla Maw Jsiah Menta, DO 01/20/16 [phone]

## 2016-01-19 NOTE — ED Notes (Signed)
Pt c/o sob x 3 days.  

## 2016-01-20 ENCOUNTER — Ambulatory Visit (HOSPITAL_COMMUNITY): Payer: Medicare Other | Admitting: Physical Therapy

## 2016-01-20 ENCOUNTER — Telehealth (HOSPITAL_COMMUNITY): Payer: Self-pay

## 2016-01-20 DIAGNOSIS — I4891 Unspecified atrial fibrillation: Secondary | ICD-10-CM | POA: Diagnosis present

## 2016-01-20 DIAGNOSIS — Z950 Presence of cardiac pacemaker: Secondary | ICD-10-CM

## 2016-01-20 DIAGNOSIS — I248 Other forms of acute ischemic heart disease: Secondary | ICD-10-CM | POA: Diagnosis present

## 2016-01-20 DIAGNOSIS — I482 Chronic atrial fibrillation: Secondary | ICD-10-CM

## 2016-01-20 DIAGNOSIS — N183 Chronic kidney disease, stage 3 unspecified: Secondary | ICD-10-CM | POA: Diagnosis present

## 2016-01-20 DIAGNOSIS — J9601 Acute respiratory failure with hypoxia: Secondary | ICD-10-CM | POA: Diagnosis present

## 2016-01-20 DIAGNOSIS — E1122 Type 2 diabetes mellitus with diabetic chronic kidney disease: Secondary | ICD-10-CM | POA: Diagnosis present

## 2016-01-20 DIAGNOSIS — N189 Chronic kidney disease, unspecified: Secondary | ICD-10-CM

## 2016-01-20 DIAGNOSIS — J441 Chronic obstructive pulmonary disease with (acute) exacerbation: Secondary | ICD-10-CM | POA: Diagnosis present

## 2016-01-20 DIAGNOSIS — Z7984 Long term (current) use of oral hypoglycemic drugs: Secondary | ICD-10-CM | POA: Diagnosis not present

## 2016-01-20 DIAGNOSIS — Z7982 Long term (current) use of aspirin: Secondary | ICD-10-CM | POA: Diagnosis not present

## 2016-01-20 DIAGNOSIS — Z87891 Personal history of nicotine dependence: Secondary | ICD-10-CM | POA: Diagnosis not present

## 2016-01-20 DIAGNOSIS — I5022 Chronic systolic (congestive) heart failure: Secondary | ICD-10-CM | POA: Diagnosis present

## 2016-01-20 DIAGNOSIS — E785 Hyperlipidemia, unspecified: Secondary | ICD-10-CM | POA: Diagnosis present

## 2016-01-20 DIAGNOSIS — I10 Essential (primary) hypertension: Secondary | ICD-10-CM | POA: Diagnosis not present

## 2016-01-20 DIAGNOSIS — I130 Hypertensive heart and chronic kidney disease with heart failure and stage 1 through stage 4 chronic kidney disease, or unspecified chronic kidney disease: Secondary | ICD-10-CM | POA: Diagnosis present

## 2016-01-20 LAB — BASIC METABOLIC PANEL
Anion gap: 10 (ref 5–15)
Anion gap: 8 (ref 5–15)
BUN: 43 mg/dL — AB (ref 6–20)
BUN: 44 mg/dL — ABNORMAL HIGH (ref 6–20)
CALCIUM: 8.8 mg/dL — AB (ref 8.9–10.3)
CO2: 22 mmol/L (ref 22–32)
CO2: 22 mmol/L (ref 22–32)
CREATININE: 1.94 mg/dL — AB (ref 0.61–1.24)
Calcium: 9.1 mg/dL (ref 8.9–10.3)
Chloride: 111 mmol/L (ref 101–111)
Chloride: 111 mmol/L (ref 101–111)
Creatinine, Ser: 2.09 mg/dL — ABNORMAL HIGH (ref 0.61–1.24)
GFR calc Af Amer: 32 mL/min — ABNORMAL LOW (ref >60)
GFR calc non Af Amer: 30 mL/min — ABNORMAL LOW (ref >60)
GFR, EST AFRICAN AMERICAN: 35 mL/min — AB (ref >60)
GFR, EST NON AFRICAN AMERICAN: 27 mL/min — AB (ref >60)
GLUCOSE: 134 mg/dL — AB (ref 65–99)
Glucose, Bld: 74 mg/dL (ref 65–99)
POTASSIUM: 5.3 mmol/L — AB (ref 3.5–5.1)
Potassium: 4.7 mmol/L (ref 3.5–5.1)
Sodium: 141 mmol/L (ref 135–145)
Sodium: 143 mmol/L (ref 135–145)

## 2016-01-20 LAB — TROPONIN I
TROPONIN I: 0.12 ng/mL — AB (ref <0.031)
Troponin I: 0.04 ng/mL — ABNORMAL HIGH (ref <0.031)
Troponin I: 0.05 ng/mL — ABNORMAL HIGH (ref <0.031)
Troponin I: 0.05 ng/mL — ABNORMAL HIGH (ref <0.031)

## 2016-01-20 LAB — CBC WITH DIFFERENTIAL/PLATELET
BASOS PCT: 1 %
Basophils Absolute: 0.1 10*3/uL (ref 0.0–0.1)
EOS ABS: 1.3 10*3/uL — AB (ref 0.0–0.7)
Eosinophils Relative: 12 %
HEMATOCRIT: 35.4 % — AB (ref 39.0–52.0)
Hemoglobin: 11.6 g/dL — ABNORMAL LOW (ref 13.0–17.0)
LYMPHS ABS: 1.7 10*3/uL (ref 0.7–4.0)
Lymphocytes Relative: 15 %
MCH: 31 pg (ref 26.0–34.0)
MCHC: 32.8 g/dL (ref 30.0–36.0)
MCV: 94.7 fL (ref 78.0–100.0)
MONO ABS: 1.1 10*3/uL — AB (ref 0.1–1.0)
Monocytes Relative: 10 %
Neutro Abs: 6.7 10*3/uL (ref 1.7–7.7)
Neutrophils Relative %: 62 %
Platelets: 160 10*3/uL (ref 150–400)
RBC: 3.74 MIL/uL — ABNORMAL LOW (ref 4.22–5.81)
RDW: 14.8 % (ref 11.5–15.5)
WBC: 10.8 10*3/uL — ABNORMAL HIGH (ref 4.0–10.5)

## 2016-01-20 LAB — GLUCOSE, CAPILLARY: Glucose-Capillary: 360 mg/dL — ABNORMAL HIGH (ref 65–99)

## 2016-01-20 LAB — CBC
HEMATOCRIT: 35.3 % — AB (ref 39.0–52.0)
Hemoglobin: 11.5 g/dL — ABNORMAL LOW (ref 13.0–17.0)
MCH: 31.1 pg (ref 26.0–34.0)
MCHC: 32.6 g/dL (ref 30.0–36.0)
MCV: 95.4 fL (ref 78.0–100.0)
Platelets: 151 10*3/uL (ref 150–400)
RBC: 3.7 MIL/uL — ABNORMAL LOW (ref 4.22–5.81)
RDW: 14.8 % (ref 11.5–15.5)
WBC: 9 10*3/uL (ref 4.0–10.5)

## 2016-01-20 LAB — BRAIN NATRIURETIC PEPTIDE: B NATRIURETIC PEPTIDE 5: 224 pg/mL — AB (ref 0.0–100.0)

## 2016-01-20 MED ORDER — INSULIN ASPART 100 UNIT/ML ~~LOC~~ SOLN
0.0000 [IU] | Freq: Three times a day (TID) | SUBCUTANEOUS | Status: DC
  Administered 2016-01-21: 8 [IU] via SUBCUTANEOUS
  Administered 2016-01-21: 5 [IU] via SUBCUTANEOUS

## 2016-01-20 MED ORDER — SODIUM CHLORIDE 0.9% FLUSH
3.0000 mL | Freq: Two times a day (BID) | INTRAVENOUS | Status: DC
  Administered 2016-01-20 – 2016-01-21 (×4): 3 mL via INTRAVENOUS

## 2016-01-20 MED ORDER — IPRATROPIUM BROMIDE 0.02 % IN SOLN
0.5000 mg | Freq: Four times a day (QID) | RESPIRATORY_TRACT | Status: DC
  Administered 2016-01-20 – 2016-01-21 (×5): 0.5 mg via RESPIRATORY_TRACT
  Filled 2016-01-20 (×6): qty 2.5

## 2016-01-20 MED ORDER — AZITHROMYCIN 250 MG PO TABS
500.0000 mg | ORAL_TABLET | Freq: Every day | ORAL | Status: AC
  Administered 2016-01-20: 500 mg via ORAL
  Filled 2016-01-20: qty 2

## 2016-01-20 MED ORDER — AZITHROMYCIN 250 MG PO TABS
250.0000 mg | ORAL_TABLET | Freq: Every day | ORAL | Status: DC
  Administered 2016-01-21: 250 mg via ORAL
  Filled 2016-01-20: qty 1

## 2016-01-20 MED ORDER — HYDRALAZINE HCL 25 MG PO TABS
12.5000 mg | ORAL_TABLET | Freq: Three times a day (TID) | ORAL | Status: DC
  Administered 2016-01-20 – 2016-01-21 (×4): 12.5 mg via ORAL
  Filled 2016-01-20 (×4): qty 1

## 2016-01-20 MED ORDER — SODIUM CHLORIDE 0.9 % IV SOLN: 250.0000 mL | INTRAVENOUS | Status: DC | PRN

## 2016-01-20 MED ORDER — GUAIFENESIN ER 600 MG PO TB12
600.0000 mg | ORAL_TABLET | Freq: Two times a day (BID) | ORAL | Status: DC
  Administered 2016-01-20 (×2): 600 mg via ORAL
  Filled 2016-01-20 (×2): qty 1

## 2016-01-20 MED ORDER — LEVALBUTEROL HCL 0.63 MG/3ML IN NEBU
0.6300 mg | INHALATION_SOLUTION | Freq: Four times a day (QID) | RESPIRATORY_TRACT | Status: DC
  Administered 2016-01-20 – 2016-01-21 (×5): 0.63 mg via RESPIRATORY_TRACT
  Filled 2016-01-20 (×6): qty 3

## 2016-01-20 MED ORDER — LEVALBUTEROL HCL 1.25 MG/0.5ML IN NEBU
2.5000 mg | INHALATION_SOLUTION | Freq: Once | RESPIRATORY_TRACT | Status: AC
  Administered 2016-01-20: 2.5 mg via RESPIRATORY_TRACT
  Filled 2016-01-20: qty 1

## 2016-01-20 MED ORDER — LEVALBUTEROL HCL 0.63 MG/3ML IN NEBU: 0.6300 mg | INHALATION_SOLUTION | Freq: Four times a day (QID) | RESPIRATORY_TRACT | Status: DC | PRN

## 2016-01-20 MED ORDER — SODIUM CHLORIDE 0.9% FLUSH: 3.0000 mL | INTRAVENOUS | Status: DC | PRN

## 2016-01-20 MED ORDER — INSULIN ASPART 100 UNIT/ML ~~LOC~~ SOLN
0.0000 [IU] | Freq: Every day | SUBCUTANEOUS | Status: DC
  Administered 2016-01-20: 4 [IU] via SUBCUTANEOUS

## 2016-01-20 MED ORDER — ISOSORBIDE MONONITRATE ER 60 MG PO TB24
30.0000 mg | ORAL_TABLET | Freq: Two times a day (BID) | ORAL | Status: DC
  Administered 2016-01-20 – 2016-01-21 (×4): 30 mg via ORAL
  Filled 2016-01-20 (×4): qty 1

## 2016-01-20 MED ORDER — METHYLPREDNISOLONE SODIUM SUCC 125 MG IJ SOLR
80.0000 mg | Freq: Two times a day (BID) | INTRAMUSCULAR | Status: DC
  Administered 2016-01-20 (×2): 80 mg via INTRAVENOUS
  Filled 2016-01-20 (×2): qty 2

## 2016-01-20 MED ORDER — GLYBURIDE 5 MG PO TABS
10.0000 mg | ORAL_TABLET | Freq: Two times a day (BID) | ORAL | Status: DC
  Administered 2016-01-20 – 2016-01-21 (×2): 10 mg via ORAL
  Filled 2016-01-20 (×2): qty 2

## 2016-01-20 MED ORDER — IPRATROPIUM BROMIDE 0.02 % IN SOLN
0.5000 mg | Freq: Once | RESPIRATORY_TRACT | Status: AC
Start: 2016-01-20 — End: 2016-01-20
  Administered 2016-01-20: 0.5 mg via RESPIRATORY_TRACT
  Filled 2016-01-20: qty 2.5

## 2016-01-20 MED ORDER — GUAIFENESIN ER 600 MG PO TB12
1200.0000 mg | ORAL_TABLET | Freq: Two times a day (BID) | ORAL | Status: DC
  Administered 2016-01-20 – 2016-01-21 (×2): 1200 mg via ORAL
  Filled 2016-01-20 (×2): qty 2

## 2016-01-20 MED ORDER — METHYLPREDNISOLONE SODIUM SUCC 125 MG IJ SOLR
60.0000 mg | Freq: Four times a day (QID) | INTRAMUSCULAR | Status: DC
  Administered 2016-01-20 – 2016-01-21 (×3): 60 mg via INTRAVENOUS
  Filled 2016-01-20 (×3): qty 2

## 2016-01-20 MED ORDER — EZETIMIBE 10 MG PO TABS
10.0000 mg | ORAL_TABLET | Freq: Every day | ORAL | Status: DC
  Administered 2016-01-20 – 2016-01-21 (×2): 10 mg via ORAL
  Filled 2016-01-20 (×2): qty 1

## 2016-01-20 MED ORDER — ASPIRIN EC 81 MG PO TBEC
81.0000 mg | DELAYED_RELEASE_TABLET | Freq: Every day | ORAL | Status: DC
  Administered 2016-01-20 – 2016-01-21 (×2): 81 mg via ORAL
  Filled 2016-01-20 (×2): qty 1

## 2016-01-20 NOTE — Telephone Encounter (Signed)
01/20/16 daughter Meriam Sprague left a message that he had been admitted to the hospital this morning

## 2016-01-20 NOTE — Care Management Obs Status (Signed)
MEDICARE OBSERVATION STATUS NOTIFICATION   Patient Details  Name: [name] MRN: [id number] Date of Birth: 04-02-1928   Medicare Observation Status Notification Given:  Yes    Adonis Huguenin, RN 01/20/2016, 1:45 PM

## 2016-01-20 NOTE — ED Notes (Signed)
Report given to Denise RN on 300 

## 2016-01-20 NOTE — ED Notes (Signed)
Pt given a sip of water before breathing treatment applied

## 2016-01-20 NOTE — Care Management Note (Signed)
Case Management Note  Patient Details  Name: WERNER LABELLA MRN: [id number] Date of Birth: 12-08-1927  Subjective/Objective:   Spoke with patient who is from home with spouse and stated that he drives self to appointments. He has a walker that he uses when not at home but not in the house. His wife still drives and is in good health.  He has the support of 4 daughters. Denies issues with medications. No Home O2.  Patient stated that he is ready to go home. No CM needs identified.                 Action/Plan: Home with self care.   Expected Discharge Date:                  Expected Discharge Plan:  Home/Self Care  In-House Referral:     Discharge Oppelo not met per provider  Post Acute Care Choice:    Choice offered to:     DME Arranged:    DME Agency:     HH Arranged:    HH Agency:     Status of Service:  Completed, signed off  Medicare Important Message Given:    Date Medicare IM Given:    Medicare IM give by:    Date Additional Medicare IM Given:    Additional Medicare Important Message give by:     If discussed at [place] of Stay Meetings, dates discussed:    Additional Comments:  Alvie Heidelberg, RN 01/20/2016, 1:49 PM

## 2016-01-20 NOTE — H&P (Signed)
PCP:   [name]., MD   Chief Complaint:  sob  HPI: 82 yo male h/o copd, chf, CKD comes in with 3 days of wheezing, coughing and worsening sob.  Has chronic swelling in his legs and this is much improved for him.  No fevers.  No chest pain.  Pt tight and wheezing on arrival and is feeling much better after several nebs and solumedrol.  Pt referred for admission for copde.  Review of Systems:  Positive and negative as per HPI otherwise all other systems are negative  Past Medical History: Past Medical History  Diagnosis Date  . Respiratory failure (HCC)     secondary to pleural effusion, severe COPD, ongoing tobacco abuse, and congestive heart failure)  . Heart block     with sinus pauses (status post St. Jude pacemaker by Dr. Graciela Husbands)  . Chronic renal insufficiency   . Chronic atrial fibrillation (HCC)     (no Coumadin secondary to GI bleeding)  . Diabetes mellitus   . Coronary artery disease 2009    last catheterization in 2009 demonstrate a 3-vessel CAD . He underwent stenting with a right  coronary artery with a  PROMUS drug-eluting stent. He  also had a stenting of his circumflex with drug-eluting  stent previously))  . Cardiomyopathy     (EF 40%)  . Degenerative joint disease   . Hypertension   . Hyperlipidemia   . Peptic ulcer disease   . Peripheral vascular disease (HCC)   . Sinoatrial node dysfunction [facility])    Past Surgical History  Procedure Laterality Date  . Cholecystectomy    . Insert / replace / remove pacemaker      St. Jude    Medications: Prior to Admission medications   Medication Sig Start Date End Date Taking? Authorizing Provider  glyBURIDE (DIABETA) 5 MG tablet Take 10 mg by mouth 2 (two) times daily with a meal.    Yes Historical Provider, MD  aspirin 81 MG tablet Take 81 mg by mouth daily.      Historical Provider, MD  ezetimibe (ZETIA) 10 MG tablet Take 10 mg by mouth daily.      Historical Provider, MD  hydrALAZINE (APRESOLINE) 25 MG  tablet 25 mg. Take 1/2 tablet by mouth three times daily    Historical Provider, MD  isosorbide mononitrate (IMDUR) 30 MG 24 hr tablet Take 30 mg by mouth 2 (two) times daily.      Historical Provider, MD  miglitol (GLYSET) 50 MG tablet Take 100 mg by mouth 3 (three) times daily.     Historical Provider, MD    Allergies:   Allergies  Allergen Reactions  . Albuterol     REACTION: agitation  . Statins   . Sulfonamide Derivatives Other (See Comments)    unknown    Social History:  reports that he quit smoking about 8 years ago. His smoking use included Cigarettes. He has a 90 pack-year smoking history. He does not have any smokeless tobacco history on file. He reports that he drinks alcohol. His drug history is not on file.  Family History: Family History  Problem Relation Age of Onset  . Coronary artery disease Neg Hx     Physical Exam: Filed Vitals:   01/20/16 0117 01/20/16 0130 01/20/16 0200 01/20/16 0230  BP:  148/49 132/49 126/72  Pulse:  70 66 69  Resp:  26 22 23   Height:      Weight:      SpO2: 97% 96% 96%  97%   General appearance: alert, cooperative and no distress Head: Normocephalic, without obvious abnormality, atraumatic Eyes: negative Nose: Nares normal. Septum midline. Mucosa normal. No drainage or sinus tenderness. Neck: no JVD and supple, symmetrical, trachea midline Lungs: diminished breath sounds bibasilar and wheezes bibasilar Heart: regular rate and rhythm, S1, S2 normal, no murmur, click, rub or gallop Abdomen: soft, non-tender; bowel sounds normal; no masses,  no organomegaly Extremities: edema trace Pulses: 2+ and symmetric Skin: Skin color, texture, turgor normal. No rashes or lesions  Wound to lle please see rn notes Neurologic: Grossly normal    Labs on Admission:   Recent Labs  01/19/16 2342  NA 141  K 4.7  CL 111  CO2 22  GLUCOSE 74  BUN 44*  CREATININE 1.94*  CALCIUM 8.8*    Recent Labs  01/19/16 2342  WBC 10.8*  NEUTROABS  6.7  HGB 11.6*  HCT 35.4*  MCV 94.7  PLT 160    Recent Labs  01/19/16 2342  TROPONINI 0.05*   Radiological Exams on Admission: Dg Chest Portable 1 View  01/20/2016  CLINICAL DATA:  Shortness of breath and wheezing for 3 days. Bilateral leg swelling. Productive cough. EXAM: PORTABLE CHEST 1 VIEW COMPARISON:  04/18/2014 FINDINGS: Cardiac pacemaker. Mild cardiac enlargement without significant pulmonary vascular congestion. No focal airspace disease or consolidation. No blunting of costophrenic angles. No pneumothorax. Calcified and tortuous aorta. Degenerative changes in the shoulders. IMPRESSION: Mild cardiac enlargement.  No evidence of active pulmonary disease. Electronically Signed   By: Burman Nieves M.D.   On: 01/20/2016 00:09    Assessment/Plan  82 yo male with acute copde  Principal Problem:   COPD exacerbation (HCC)-  Zpack.  freq nebs.  Solumedrol.  Oxygen supplementation.  Already improved with tx in ED  Active Problems:  Stable unless o/w noted   Essential hypertension- noted   Atrial fibrillation (HCC)- rate controlled at this time   Cardiac pacemaker in situ- noted   CHF (congestive heart failure) (HCC)- compensated, stable   CKD (chronic kidney disease)- at baseline  obs on medical.  Full code.  Pt vapping at home, not tobacco smoking.  Will not take albuterol, prefers xoponex.  Full code.  Eiden Bagot A 01/20/2016, 3:05 AM

## 2016-01-20 NOTE — Care Management (Signed)
Nebulizer machine ordered from [facility] to be delivered to room prior to discharge.

## 2016-01-20 NOTE — Care Management Obs Status (Signed)
MEDICARE OBSERVATION STATUS NOTIFICATION   Patient Details  Name: RUDIE SERVEN MRN: [id number] Date of Birth: 07-05-1928   Medicare Observation Status Notification Given:  Yes    Adonis Huguenin, RN 01/20/2016, 1:23 PM

## 2016-01-20 NOTE — Progress Notes (Signed)
Patient admitted to the hospital earlier this morning by Dr. Onalee Hua.  Patient seen and examined. He is feeling significantly improved. Wants to go home. Continues to have nonproductive cough. Shortness of breath is improved. Has a chronic wheeze.  Lungs show diminished breath sounds bilaterally with mild wheeze. Remainder of exam is unremarkable.  Patient was admitted to the hospital with acute respiratory failure related to COPD exacerbation. He was started on intravenous steroids and bronchodilators. Will need to continue with current treatments. Continue Zithromax as well as pulmonary hygiene. The patient was having difficulty getting out of bed. Will request physical therapy consultation. Anticipate discharged in the next 24 hours.  [name]

## 2016-01-21 DIAGNOSIS — I10 Essential (primary) hypertension: Secondary | ICD-10-CM

## 2016-01-21 LAB — BASIC METABOLIC PANEL
Anion gap: 8 (ref 5–15)
BUN: 49 mg/dL — AB (ref 6–20)
CALCIUM: 8.6 mg/dL — AB (ref 8.9–10.3)
CO2: 20 mmol/L — ABNORMAL LOW (ref 22–32)
CREATININE: 1.88 mg/dL — AB (ref 0.61–1.24)
Chloride: 106 mmol/L (ref 101–111)
GFR calc Af Amer: 36 mL/min — ABNORMAL LOW (ref >60)
GFR, EST NON AFRICAN AMERICAN: 31 mL/min — AB (ref >60)
GLUCOSE: 259 mg/dL — AB (ref 65–99)
Potassium: 4.7 mmol/L (ref 3.5–5.1)
Sodium: 134 mmol/L — ABNORMAL LOW (ref 135–145)

## 2016-01-21 LAB — GLUCOSE, CAPILLARY
Glucose-Capillary: 242 mg/dL — ABNORMAL HIGH (ref 65–99)
Glucose-Capillary: 261 mg/dL — ABNORMAL HIGH (ref 65–99)
Glucose-Capillary: 137 mg/dL — ABNORMAL HIGH (ref 65–99)

## 2016-01-21 MED ORDER — PREDNISONE 10 MG PO TABS: ORAL_TABLET | ORAL | Status: DC

## 2016-01-21 MED ORDER — IPRATROPIUM-ALBUTEROL 0.5-2.5 (3) MG/3ML IN SOLN: 3.0000 mL | Freq: Four times a day (QID) | RESPIRATORY_TRACT | Status: AC | PRN

## 2016-01-21 MED ORDER — GUAIFENESIN ER 600 MG PO TB12: 600.0000 mg | ORAL_TABLET | Freq: Two times a day (BID) | ORAL | Status: AC

## 2016-01-21 MED ORDER — AZITHROMYCIN 250 MG PO TABS: ORAL_TABLET | ORAL | Status: DC

## 2016-01-21 NOTE — Clinical Documentation Improvement (Signed)
Internal Medicine  Can the diagnosis of CHF be further specified?    Acuity - Acute, Chronic, Acute on Chronic   Type - Systolic, Diastolic, Systolic and Diastolic  Other  Clinically Undetermined  Document any associated diagnoses/conditions Supporting Information: H&P: CHF (congestive heart failure) (HCC)- compensated, stable Note: CHF, appears compensated  Please exercise your independent, professional judgment when responding. A specific answer is not anticipated or expected. Please update your documentation within the medical record to reflect your response to this query. Thank you  Thank Barrie Dunker [facility] Information [facility] [place] [phone]

## 2016-01-21 NOTE — Care Management (Signed)
Outpatient PT referral faxed to [facility]

## 2016-01-21 NOTE — Discharge Summary (Signed)
Physician Discharge Summary  [name] ZOX:[id number] DOB: 10-Jul-1928 DOA: 01/19/2016  PCP: [name]., MD  Admit date: 01/19/2016 Discharge date: 01/21/2016  Time spent: 35 minutes  Recommendations for Outpatient Follow-up:  1. Will discharge with a course of abx 2. Follow up with PCP in 1-2 weeks.   Discharge Diagnoses:  Principal Problem:   COPD exacerbation (HCC) Active Problems:   Essential hypertension   Atrial fibrillation (HCC)   Cardiac pacemaker in situ   CHF (congestive heart failure) (HCC)   CKD (chronic kidney disease) stage 3, GFR 30-59 ml/min   Acute respiratory failure with hypoxia [facility])   Discharge Condition: improved  Diet recommendation: Carb modified, heart healthy  Filed Weights   01/19/16 2315 01/20/16 0327 01/21/16 0647  Weight: 81.194 kg (179 lb) 81.1 kg (178 lb 12.7 oz) 83.5 kg (184 lb 1.4 oz)    History of present illness:  46 yom with a hx of COPD, CKD stage 3, and acute on chronic systolic CHF presented complaining of wheezing, cough, and worsening SOB that onset 3 days prior to admission. He has chronic swelling in his legs, though it was noted to be much improved during examination. He was given nabs and solumedrol on arrival and felt his symptoms improve. He was referred for COPD exacerbation.   Hospital Course:  Patient was found to have COPD exacerbation, and received nebs and steroids in the ED with significant improvement of his symptoms. He was referred for admission and subsequently started on abx, steroids, bronchodilators, and pulmonary hygiene. He was also provided with supplemental oxygen. He is now breathing comfortably on RA. He will be discharged on a steroid taper and a course of abx. He will also be set up with a nebulizer at home.  1. Chronic systolic CHF, appears compensated. 2. Acute respiratory failure with hypoxia, related to COPD exacerbation. Improved with management of COPD and now breathing comfortably on RA.  Continue management as above. Continue O2. 3. Elevated troponin. 0.12 yesterday. Likely demand ischemia in the setting of respiratory illness. No complaints of chest pain. Would favor treating conservatively.  4. Essential HTN: Stable. 5. CKD stage 3: Appears stable. 6. A-fib: stable. Heart rate is controlled. Not on any anticoagulation due to GI bleed.  7. DM type 2: Elevated in setting of steroids. Anticipate improvement as steroids are tapered. Continue home regimen.  8. Tobacco use. Counseled on the importance of cessation. 9. Cardiac pacemake in situ.  Procedures:  none  Consultations:  none  Discharge Exam: Filed Vitals:   01/20/16 2226 01/21/16 0647  BP: 141/55   Pulse: 60 66  Temp: 97.8 F (36.6 C) 97.5 F (36.4 C)  Resp: 16 18    Examination:   General exam: Appears calm and comfortable  Respiratory system: Clear to auscultation. Respiratory effort normal. Cardiovascular system: S1 & S2 heard, RRR. No JVD, murmurs, rubs, gallops or clicks. No pedal edema. Gastrointestinal system: Abdomen is nondistended, soft and nontender. No organomegaly or masses felt. Normal bowel sounds heard. Central nervous system: Alert and oriented. No focal neurological deficits. Extremities: Symmetric 5 x 5 power. Skin: No rashes, lesions or ulcers Psychiatry: Judgement and insight appear normal. Mood & affect appropriate.   Discharge Instructions   Discharge Instructions    Diet - low sodium heart healthy    Complete by:  As directed      Increase activity slowly    Complete by:  As directed           Current Discharge  Medication List    START taking these medications   Details  azithromycin (ZITHROMAX) 250 MG tablet Take one tablet po daily Qty: 3 each, Refills: 0    guaiFENesin (MUCINEX) 600 MG 12 hr tablet Take 1 tablet (600 mg total) by mouth 2 (two) times daily. Qty: 30 tablet, Refills: 0    ipratropium-albuterol (DUONEB) 0.5-2.5 (3) MG/3ML SOLN Take 3 mLs by  nebulization every 6 (six) hours as needed. Qty: 360 mL, Refills: 0    predniSONE (DELTASONE) 10 MG tablet Take  po daily for 2 days then  daily for 2 days then  daily for 2 days then  daily for 2 days then stop Qty: 20 tablet, Refills: 0      CONTINUE these medications which have NOT CHANGED   Details  aspirin 81 MG tablet Take 81 mg by mouth daily.      ezetimibe (ZETIA) 10 MG tablet Take 10 mg by mouth daily.      glyBURIDE (DIABETA) 5 MG tablet Take 10 mg by mouth 2 (two) times daily with a meal.     GLYSET 100 MG tablet Take 1 tablet by mouth 3 (three) times daily with meals.     hydrALAZINE (APRESOLINE) 25 MG tablet 25 mg. Take 1/2 tablet by mouth three times daily    isosorbide mononitrate (IMDUR) 30 MG 24 hr tablet Take 30 mg by mouth 2 (two) times daily.         Allergies  Allergen Reactions  . Albuterol     REACTION: agitation  . Statins   . Sulfonamide Derivatives Other (See Comments)    unknown      The results of significant diagnostics from this hospitalization (including imaging, microbiology, ancillary and laboratory) are listed below for reference.    Significant Diagnostic Studies: Dg Chest Portable 1 View  01/20/2016  CLINICAL DATA:  Shortness of breath and wheezing for 3 days. Bilateral leg swelling. Productive cough. EXAM: PORTABLE CHEST 1 VIEW COMPARISON:  04/18/2014 FINDINGS: Cardiac pacemaker. Mild cardiac enlargement without significant pulmonary vascular congestion. No focal airspace disease or consolidation. No blunting of costophrenic angles. No pneumothorax. Calcified and tortuous aorta. Degenerative changes in the shoulders. IMPRESSION: Mild cardiac enlargement.  No evidence of active pulmonary disease. Electronically Signed   By: Burman Nieves M.D.   On: 01/20/2016 00:09    Microbiology: No results found for this or any previous visit (from the past 240 hour(s)).   Labs: Basic Metabolic Panel:  Recent Labs Lab  01/19/16 2342 01/20/16 0316 01/21/16 0705  NA 141 143 134*  K 4.7 5.3* 4.7  CL 111 111 106  CO2 22 22 20*  GLUCOSE 74 134* 259*  BUN 44* 43* 49*  CREATININE 1.94* 2.09* 1.88*  CALCIUM 8.8* 9.1 8.6*   Liver Function Tests: No results for input(s): AST, ALT, ALKPHOS, BILITOT, PROT, ALBUMIN in the last 168 hours. No results for input(s): LIPASE, AMYLASE in the last 168 hours. No results for input(s): AMMONIA in the last 168 hours. CBC:  Recent Labs Lab 01/19/16 2342 01/20/16 0316  WBC 10.8* 9.0  NEUTROABS 6.7  --   HGB 11.6* 11.5*  HCT 35.4* 35.3*  MCV 94.7 95.4  PLT 160 151   Cardiac Enzymes:  Recent Labs Lab 01/19/16 2342 01/20/16 0316 01/20/16 0900 01/20/16 1511  TROPONINI 0.05* 0.04* 0.05* 0.12*   BNP: BNP (last 3 results)  Recent Labs  01/19/16 2342  BNP 224.0*    ProBNP (last 3 results) No results for input(s): PROBNP  in the last 8760 hours.  CBG:  Recent Labs Lab 01/20/16 2222 01/21/16 0744 01/21/16 1124  GLUCAP 360* 242* 261*       Signed:  Erick Blinks, MD.  [facility] 01/21/2016, 1:43 PM  By signing my name below, I, Adron Bene, attest that this documentation has been prepared under the direction and in the presence of Erick Blinks, MD. Electronically Signed: Adron Bene 01/21/2016 1:30pm  I, Dr. Erick Blinks, personally performed the services described in this documentaiton. All medical record entries made by the scribe were at my direction and in my presence. I have reviewed the chart and agree that the record reflects my personal performance and is accurate and complete  Erick Blinks, MD, 01/21/2016 1:43 PM

## 2016-01-21 NOTE — Consult Note (Signed)
   [facility] Inpatient Consult   01/21/2016  SHAPHAN KENDRICK 1928-04-18 [id number]  Spoke with patient at bedside regarding [facility] program services.  Patient reports he is going home today and would appreciate a brochure and will give to his wife and daughter for them to review and contact number for them to call if they decide they would like [facility] program services in the future. Noted in chart that [facility] had reached out to them earlier this month regarding services and they did not wish to accept services at that time. Of note, [facility] Care Management services does not replace or interfere with any services that are arranged by inpatient case management or social work.  For additional questions or referrals please contact: Alben Spittle. Albertha Ghee, RN, BSN, [facility]  [employer] Liaison [phone]

## 2016-01-21 NOTE — Progress Notes (Signed)
[name] discharged Home per MD order.  Discharge instructions reviewed and discussed with the patient, all questions and concerns answered. Copy of instructions and scripts given to patient.    Medication List    TAKE these medications        aspirin 81 MG tablet  Take 81 mg by mouth daily.     azithromycin 250 MG tablet  Commonly known as:  ZITHROMAX  Take one tablet po daily     ezetimibe 10 MG tablet  Commonly known as:  ZETIA  Take 10 mg by mouth daily.     glyBURIDE 5 MG tablet  Commonly known as:  DIABETA  Take 10 mg by mouth 2 (two) times daily with a meal.     GLYSET 100 MG tablet  Generic drug:  miglitol  Take 1 tablet by mouth 3 (three) times daily with meals.     guaiFENesin 600 MG 12 hr tablet  Commonly known as:  MUCINEX  Take 1 tablet (600 mg total) by mouth 2 (two) times daily.     hydrALAZINE 25 MG tablet  Commonly known as:  APRESOLINE  25 mg. Take 1/2 tablet by mouth three times daily     ipratropium-albuterol 0.5-2.5 (3) MG/3ML Soln  Commonly known as:  DUONEB  Take 3 mLs by nebulization every 6 (six) hours as needed.     isosorbide mononitrate 30 MG 24 hr tablet  Commonly known as:  IMDUR  Take 30 mg by mouth 2 (two) times daily.     predniSONE 10 MG tablet  Commonly known as:  DELTASONE  Take 40mg  po daily for 2 days then 30mg  daily for 2 days then 20mg  daily for 2 days then 10mg  daily for 2 days then stop        Patients skin is clean, dry.IV site discontinued and catheter remains intact. Site without signs and symptoms of complications. Dressing and pressure applied.  Patient escorted to car via wheelchair,  no distress noted upon discharge.  [name] 01/21/2016 4:22 PM

## 2016-01-21 NOTE — Evaluation (Signed)
Physical Therapy Evaluation Patient Details Name: [name] MRN: [id number] DOB: Oct 07, 1927 Today's Date: 01/21/2016   History of Present Illness  82 yo male h/o copd, chf, CKD comes in with 3 days of wheezing, coughing and worsening sob. Has chronic swelling in his legs and this is much improved for him. No fevers. No chest pain. Pt tight and wheezing on arrival and is feeling much better after several nebs and solumedrol. Pt referred for admission for copde.  Clinical Impression  Pt received in bed, and was agreeable to PT evaluation.  Pt required Min/CGA for sit<>stand, and CGA/Min A for gait x 35ft with RW.  Pt demonstrates poor safety awareness with RW as he turned it to the side to get into the bathroom despite being educated to use it to get into the bathroom.  Pt currently lives with his wife, who also uses a RW for mobilization.  His dtr assists with running errands and groceries.  Pt still drives and has already been going to OPPT for L LE wound.  At this point, it is recommended that he continue with OPPT for his L LE wound, as well as additional OPPT for strength, balance, and endurance.    Follow Up Recommendations Outpatient PT    Equipment Recommendations  None recommended by PT    Recommendations for Other Services       Precautions / Restrictions Precautions Precautions: Fall Restrictions Weight Bearing Restrictions: No      Mobility  Bed Mobility Overal bed mobility: Modified Independent                Transfers Overall transfer level: Needs assistance Equipment used: Rolling walker (2 wheeled) Transfers: Sit to/from Stand Sit to Stand: Min assist            Ambulation/Gait Ambulation/Gait assistance: Min guard Ambulation Distance (Feet): 25 Feet Assistive device: Rolling walker (2 wheeled) Gait Pattern/deviations: Step-to pattern;Trunk flexed;Shuffle     General Gait Details: Pt demonstrates Parkinson like gait with short shuffled  steps, flexed fwd posture.  Pt able to correct, but not sustain.  Pt pushed the RW to the side when getting into the bathroom despite educate on safety and endurance from the PT.  Pt grabbing onto grab bars to steady himself.    Stairs            Wheelchair Mobility    Modified Rankin (Stroke Patients Only)       Balance Overall balance assessment: Needs assistance Sitting-balance support: Bilateral upper extremity supported       Standing balance support: Bilateral upper extremity supported Standing balance-Leahy Scale: Fair                               Pertinent Vitals/Pain Pain Assessment: No/denies pain    Home Living Family/patient expects to be discharged to:: Private residence Living Arrangements: Spouse/significant other Available Help at Discharge: Family (Dtr nearby and assists them with groceries. ) Type of Home: House       Home Layout: One level Home Equipment: Shower seat;Cane - single point;Walker - 2 wheels      Prior Function Level of Independence: Independent with assistive device(s)   Gait / Transfers Assistance Needed: Pt states that he uses the cane inside the house, and uses the RW when out in the community.   ADL's / Homemaking Assistance Needed: Pt states that he is independent with ADL's, but pt's dtr assists pt with  running errands.         Hand Dominance        Extremity/Trunk Assessment               Lower Extremity Assessment: Generalized weakness         Communication   Communication: No difficulties  Cognition Arousal/Alertness: Awake/alert Behavior During Therapy: WFL for tasks assessed/performed Overall Cognitive Status: Within Functional Limits for tasks assessed Area of Impairment: Safety/judgement         Safety/Judgement: Decreased awareness of safety;Decreased awareness of deficits     General Comments: Pt pushed the RW to the side when going into the bathroom despite PT educating pt  on need to use RW for safety and endurance.     General Comments General comments (skin integrity, edema, etc.): Pt with L LE wrapped  with bandage.  Pt has been going to OPPT for wound care.     Exercises        Assessment/Plan    PT Assessment Patient needs continued PT services  PT Diagnosis Generalized weakness;Difficulty walking   PT Problem List Decreased strength;Decreased activity tolerance;Decreased balance;Decreased mobility;Decreased safety awareness;Decreased knowledge of use of DME;Decreased knowledge of precautions;Cardiopulmonary status limiting activity;Decreased skin integrity  PT Treatment Interventions Gait training;DME instruction;Functional mobility training;Therapeutic activities;Therapeutic exercise;Balance training;Patient/family education   PT Goals (Current goals can be found in the Care Plan section) Acute Rehab PT Goals Patient Stated Goal: To go home.  PT Goal Formulation: With patient Time For Goal Achievement: 01/28/16 Potential to Achieve Goals: Fair    Frequency Min 3X/week   Barriers to discharge        Co-evaluation               End of Session Equipment Utilized During Treatment: Gait belt Activity Tolerance: Patient limited by fatigue Patient left: in chair;with call bell/phone within reach      Functional Assessment Tool Used: Clinical Judgement Functional Limitation: Mobility: Walking and moving around Mobility: Walking and Moving Around Current Status [phone]): At least 40 percent but less than 60 percent impaired, limited or restricted Mobility: Walking and Moving Around Goal Status [phone]): At least 20 percent but less than 40 percent impaired, limited or restricted    Time: 0926-0950 PT Time Calculation (min) (ACUTE ONLY): 24 min   Charges:   PT Evaluation $PT Eval Low Complexity: 1 Procedure PT Treatments $Gait Training: 8-22 mins   PT G Codes:   PT G-Codes **NOT FOR INPATIENT CLASS** Functional Assessment Tool  Used: Clinical Judgement Functional Limitation: Mobility: Walking and moving around Mobility: Walking and Moving Around Current Status ([id number]): At least 40 percent but less than 60 percent impaired, limited or restricted Mobility: Walking and Moving Around Goal Status [phone]): At least 20 percent but less than 40 percent impaired, limited or restricted    Waynetta Sandy Gilberto Streck, PT, DPT X: [phone]   01/21/2016, 11:45 AM

## 2016-01-25 ENCOUNTER — Ambulatory Visit (HOSPITAL_COMMUNITY): Payer: Medicare Other | Admitting: Physical Therapy

## 2016-01-27 ENCOUNTER — Emergency Department (HOSPITAL_COMMUNITY): Payer: Medicare Other

## 2016-01-27 ENCOUNTER — Ambulatory Visit (HOSPITAL_COMMUNITY): Payer: Medicare Other | Admitting: Physical Therapy

## 2016-01-27 ENCOUNTER — Inpatient Hospital Stay (HOSPITAL_COMMUNITY)
Admission: EM | Admit: 2016-01-27 | Discharge: 2016-01-31 | DRG: 291 | Disposition: A | Discharge status: Home Health | Payer: Medicare Other | Attending: Internal Medicine | Admitting: Internal Medicine

## 2016-01-27 ENCOUNTER — Encounter (HOSPITAL_COMMUNITY): Payer: Self-pay | Admitting: Emergency Medicine

## 2016-01-27 ENCOUNTER — Other Ambulatory Visit: Payer: Self-pay

## 2016-01-27 DIAGNOSIS — M6281 Muscle weakness (generalized): Secondary | ICD-10-CM

## 2016-01-27 DIAGNOSIS — R2689 Other abnormalities of gait and mobility: Secondary | ICD-10-CM

## 2016-01-27 DIAGNOSIS — J441 Chronic obstructive pulmonary disease with (acute) exacerbation: Secondary | ICD-10-CM | POA: Diagnosis present

## 2016-01-27 DIAGNOSIS — E1122 Type 2 diabetes mellitus with diabetic chronic kidney disease: Secondary | ICD-10-CM | POA: Diagnosis present

## 2016-01-27 DIAGNOSIS — E875 Hyperkalemia: Secondary | ICD-10-CM | POA: Diagnosis present

## 2016-01-27 DIAGNOSIS — Z8711 Personal history of peptic ulcer disease: Secondary | ICD-10-CM

## 2016-01-27 DIAGNOSIS — I10 Essential (primary) hypertension: Secondary | ICD-10-CM | POA: Diagnosis not present

## 2016-01-27 DIAGNOSIS — I130 Hypertensive heart and chronic kidney disease with heart failure and stage 1 through stage 4 chronic kidney disease, or unspecified chronic kidney disease: Principal | ICD-10-CM | POA: Diagnosis present

## 2016-01-27 DIAGNOSIS — F17210 Nicotine dependence, cigarettes, uncomplicated: Secondary | ICD-10-CM | POA: Diagnosis present

## 2016-01-27 DIAGNOSIS — I429 Cardiomyopathy, unspecified: Secondary | ICD-10-CM | POA: Diagnosis present

## 2016-01-27 DIAGNOSIS — E785 Hyperlipidemia, unspecified: Secondary | ICD-10-CM | POA: Diagnosis present

## 2016-01-27 DIAGNOSIS — J9621 Acute and chronic respiratory failure with hypoxia: Secondary | ICD-10-CM | POA: Diagnosis present

## 2016-01-27 DIAGNOSIS — S81802S Unspecified open wound, left lower leg, sequela: Secondary | ICD-10-CM

## 2016-01-27 DIAGNOSIS — S81801S Unspecified open wound, right lower leg, sequela: Secondary | ICD-10-CM

## 2016-01-27 DIAGNOSIS — F17200 Nicotine dependence, unspecified, uncomplicated: Secondary | ICD-10-CM

## 2016-01-27 DIAGNOSIS — I2510 Atherosclerotic heart disease of native coronary artery without angina pectoris: Secondary | ICD-10-CM | POA: Diagnosis present

## 2016-01-27 DIAGNOSIS — N183 Chronic kidney disease, stage 3 unspecified: Secondary | ICD-10-CM | POA: Diagnosis present

## 2016-01-27 DIAGNOSIS — R2681 Unsteadiness on feet: Secondary | ICD-10-CM

## 2016-01-27 DIAGNOSIS — I482 Chronic atrial fibrillation: Secondary | ICD-10-CM | POA: Diagnosis present

## 2016-01-27 DIAGNOSIS — E1165 Type 2 diabetes mellitus with hyperglycemia: Secondary | ICD-10-CM | POA: Diagnosis present

## 2016-01-27 DIAGNOSIS — R0602 Shortness of breath: Secondary | ICD-10-CM | POA: Diagnosis present

## 2016-01-27 DIAGNOSIS — I739 Peripheral vascular disease, unspecified: Secondary | ICD-10-CM | POA: Diagnosis present

## 2016-01-27 DIAGNOSIS — R262 Difficulty in walking, not elsewhere classified: Secondary | ICD-10-CM

## 2016-01-27 DIAGNOSIS — J9601 Acute respiratory failure with hypoxia: Secondary | ICD-10-CM | POA: Diagnosis present

## 2016-01-27 DIAGNOSIS — Z950 Presence of cardiac pacemaker: Secondary | ICD-10-CM | POA: Diagnosis not present

## 2016-01-27 DIAGNOSIS — Z7984 Long term (current) use of oral hypoglycemic drugs: Secondary | ICD-10-CM

## 2016-01-27 DIAGNOSIS — Z7982 Long term (current) use of aspirin: Secondary | ICD-10-CM

## 2016-01-27 DIAGNOSIS — E0865 Diabetes mellitus due to underlying condition with hyperglycemia: Secondary | ICD-10-CM | POA: Diagnosis not present

## 2016-01-27 DIAGNOSIS — I5023 Acute on chronic systolic (congestive) heart failure: Secondary | ICD-10-CM | POA: Diagnosis present

## 2016-01-27 DIAGNOSIS — Z955 Presence of coronary angioplasty implant and graft: Secondary | ICD-10-CM | POA: Diagnosis not present

## 2016-01-27 DIAGNOSIS — I509 Heart failure, unspecified: Secondary | ICD-10-CM | POA: Diagnosis not present

## 2016-01-27 DIAGNOSIS — E8770 Fluid overload, unspecified: Secondary | ICD-10-CM

## 2016-01-27 LAB — CBC WITH DIFFERENTIAL/PLATELET
BASOS PCT: 0 %
Basophils Absolute: 0 10*3/uL (ref 0.0–0.1)
Eosinophils Absolute: 0 10*3/uL (ref 0.0–0.7)
Eosinophils Relative: 0 %
HEMATOCRIT: 38 % — AB (ref 39.0–52.0)
Hemoglobin: 12.3 g/dL — ABNORMAL LOW (ref 13.0–17.0)
LYMPHS PCT: 3 %
Lymphs Abs: 0.5 10*3/uL — ABNORMAL LOW (ref 0.7–4.0)
MCH: 30.1 pg (ref 26.0–34.0)
MCHC: 32.4 g/dL (ref 30.0–36.0)
MCV: 93.1 fL (ref 78.0–100.0)
MONO ABS: 0.9 10*3/uL (ref 0.1–1.0)
Monocytes Relative: 5 %
Neutro Abs: 16.8 10*3/uL — ABNORMAL HIGH (ref 1.7–7.7)
Neutrophils Relative %: 92 %
PLATELETS: 193 10*3/uL (ref 150–400)
RBC: 4.08 MIL/uL — AB (ref 4.22–5.81)
RDW: 15.3 % (ref 11.5–15.5)
WBC: 18.2 10*3/uL — AB (ref 4.0–10.5)

## 2016-01-27 LAB — COMPREHENSIVE METABOLIC PANEL
ALBUMIN: 3.9 g/dL (ref 3.5–5.0)
ALT: 30 U/L (ref 17–63)
AST: 28 U/L (ref 15–41)
Alkaline Phosphatase: 59 U/L (ref 38–126)
Anion gap: 8 (ref 5–15)
BILIRUBIN TOTAL: 0.8 mg/dL (ref 0.3–1.2)
BUN: 49 mg/dL — AB (ref 6–20)
CHLORIDE: 109 mmol/L (ref 101–111)
CO2: 21 mmol/L — ABNORMAL LOW (ref 22–32)
Calcium: 8.4 mg/dL — ABNORMAL LOW (ref 8.9–10.3)
Creatinine, Ser: 1.81 mg/dL — ABNORMAL HIGH (ref 0.61–1.24)
GFR calc Af Amer: 38 mL/min — ABNORMAL LOW (ref >60)
GFR, EST NON AFRICAN AMERICAN: 32 mL/min — AB (ref >60)
GLUCOSE: 280 mg/dL — AB (ref 65–99)
POTASSIUM: 6 mmol/L — AB (ref 3.5–5.1)
Sodium: 138 mmol/L (ref 135–145)
Total Protein: 6.7 g/dL (ref 6.5–8.1)

## 2016-01-27 LAB — I-STAT CHEM 8, ED
BUN: 46 mg/dL — ABNORMAL HIGH (ref 6–20)
CHLORIDE: 105 mmol/L (ref 101–111)
Calcium, Ion: 1.08 mmol/L — ABNORMAL LOW (ref 1.13–1.30)
Creatinine, Ser: 1.7 mg/dL — ABNORMAL HIGH (ref 0.61–1.24)
GLUCOSE: 280 mg/dL — AB (ref 65–99)
HCT: 40 % (ref 39.0–52.0)
HEMOGLOBIN: 13.6 g/dL (ref 13.0–17.0)
POTASSIUM: 5.7 mmol/L — AB (ref 3.5–5.1)
SODIUM: 138 mmol/L (ref 135–145)
TCO2: 25 mmol/L (ref 0–100)

## 2016-01-27 LAB — GLUCOSE, CAPILLARY: GLUCOSE-CAPILLARY: 328 mg/dL — AB (ref 65–99)

## 2016-01-27 LAB — I-STAT TROPONIN, ED: Troponin i, poc: 0.07 ng/mL (ref 0.00–0.08)

## 2016-01-27 LAB — BRAIN NATRIURETIC PEPTIDE: B Natriuretic Peptide: 354 pg/mL — ABNORMAL HIGH (ref 0.0–100.0)

## 2016-01-27 MED ORDER — SODIUM CHLORIDE 0.9% FLUSH: 3.0000 mL | INTRAVENOUS | Status: DC | PRN

## 2016-01-27 MED ORDER — ONDANSETRON HCL 4 MG/2ML IJ SOLN: 4.0000 mg | Freq: Four times a day (QID) | INTRAMUSCULAR | Status: DC | PRN

## 2016-01-27 MED ORDER — FUROSEMIDE 10 MG/ML IJ SOLN
40.0000 mg | Freq: Two times a day (BID) | INTRAMUSCULAR | Status: DC
  Administered 2016-01-28: 40 mg via INTRAVENOUS
  Filled 2016-01-27: qty 4

## 2016-01-27 MED ORDER — INSULIN ASPART 100 UNIT/ML ~~LOC~~ SOLN
0.0000 [IU] | Freq: Three times a day (TID) | SUBCUTANEOUS | Status: DC
  Administered 2016-01-28: 11 [IU] via SUBCUTANEOUS
  Administered 2016-01-28: 5 [IU] via SUBCUTANEOUS
  Administered 2016-01-28: 11 [IU] via SUBCUTANEOUS
  Administered 2016-01-29: 3 [IU] via SUBCUTANEOUS
  Administered 2016-01-29: 11 [IU] via SUBCUTANEOUS
  Administered 2016-01-29: 3 [IU] via SUBCUTANEOUS
  Administered 2016-01-30 (×2): 11 [IU] via SUBCUTANEOUS
  Administered 2016-01-30: 3 [IU] via SUBCUTANEOUS

## 2016-01-27 MED ORDER — ALBUTEROL SULFATE (2.5 MG/3ML) 0.083% IN NEBU
5.0000 mg | INHALATION_SOLUTION | Freq: Once | RESPIRATORY_TRACT | Status: AC
  Administered 2016-01-27: 5 mg via RESPIRATORY_TRACT
  Filled 2016-01-27: qty 6

## 2016-01-27 MED ORDER — SODIUM CHLORIDE 0.9% FLUSH
3.0000 mL | Freq: Two times a day (BID) | INTRAVENOUS | Status: DC
  Administered 2016-01-28 – 2016-01-30 (×2): 3 mL via INTRAVENOUS

## 2016-01-27 MED ORDER — LEVALBUTEROL HCL 0.63 MG/3ML IN NEBU
0.6300 mg | INHALATION_SOLUTION | RESPIRATORY_TRACT | Status: DC
  Administered 2016-01-27 – 2016-01-28 (×7): 0.63 mg via RESPIRATORY_TRACT
  Filled 2016-01-27 (×7): qty 3

## 2016-01-27 MED ORDER — LEVALBUTEROL HCL 0.63 MG/3ML IN NEBU: 0.6300 mg | INHALATION_SOLUTION | Freq: Four times a day (QID) | RESPIRATORY_TRACT | Status: DC | PRN

## 2016-01-27 MED ORDER — BISOPROLOL FUMARATE 5 MG PO TABS
5.0000 mg | ORAL_TABLET | Freq: Every day | ORAL | Status: DC
  Administered 2016-01-28 – 2016-01-31 (×4): 5 mg via ORAL
  Filled 2016-01-27 (×6): qty 1

## 2016-01-27 MED ORDER — INSULIN ASPART 100 UNIT/ML IV SOLN
10.0000 [IU] | Freq: Once | INTRAVENOUS | Status: AC
  Administered 2016-01-27: 10 [IU] via INTRAVENOUS

## 2016-01-27 MED ORDER — FUROSEMIDE 10 MG/ML IJ SOLN: 40.0000 mg | Freq: Two times a day (BID) | INTRAMUSCULAR | Status: DC

## 2016-01-27 MED ORDER — INSULIN ASPART 100 UNIT/ML ~~LOC~~ SOLN
SUBCUTANEOUS | Status: AC
  Filled 2016-01-27: qty 1

## 2016-01-27 MED ORDER — SODIUM CHLORIDE 0.9 % IV SOLN: 250.0000 mL | INTRAVENOUS | Status: DC | PRN

## 2016-01-27 MED ORDER — BUDESONIDE 0.25 MG/2ML IN SUSP
0.2500 mg | Freq: Two times a day (BID) | RESPIRATORY_TRACT | Status: DC
  Administered 2016-01-27 – 2016-01-31 (×8): 0.25 mg via RESPIRATORY_TRACT
  Filled 2016-01-27 (×8): qty 2

## 2016-01-27 MED ORDER — FUROSEMIDE 10 MG/ML IJ SOLN
40.0000 mg | Freq: Once | INTRAMUSCULAR | Status: AC
  Administered 2016-01-27: 40 mg via INTRAVENOUS
  Filled 2016-01-27: qty 4

## 2016-01-27 MED ORDER — ONDANSETRON HCL 4 MG PO TABS: 4.0000 mg | ORAL_TABLET | Freq: Four times a day (QID) | ORAL | Status: DC | PRN

## 2016-01-27 MED ORDER — METHYLPREDNISOLONE SODIUM SUCC 125 MG IJ SOLR
125.0000 mg | Freq: Once | INTRAMUSCULAR | Status: AC
  Administered 2016-01-27: 125 mg via INTRAVENOUS
  Filled 2016-01-27: qty 2

## 2016-01-27 MED ORDER — GUAIFENESIN ER 600 MG PO TB12
600.0000 mg | ORAL_TABLET | Freq: Two times a day (BID) | ORAL | Status: DC
  Administered 2016-01-27: 600 mg via ORAL
  Filled 2016-01-27: qty 1

## 2016-01-27 MED ORDER — SODIUM CHLORIDE 0.9% FLUSH
3.0000 mL | INTRAVENOUS | Status: DC | PRN
  Administered 2016-01-30: 23:00:00 via INTRAVENOUS
  Filled 2016-01-27: qty 3

## 2016-01-27 MED ORDER — HYDRALAZINE HCL 25 MG PO TABS
12.5000 mg | ORAL_TABLET | Freq: Three times a day (TID) | ORAL | Status: DC
  Administered 2016-01-27 – 2016-01-31 (×10): 12.5 mg via ORAL
  Filled 2016-01-27 (×10): qty 1

## 2016-01-27 MED ORDER — EZETIMIBE 10 MG PO TABS
10.0000 mg | ORAL_TABLET | Freq: Every day | ORAL | Status: DC
  Administered 2016-01-28 – 2016-01-31 (×4): 10 mg via ORAL
  Filled 2016-01-27 (×4): qty 1

## 2016-01-27 MED ORDER — ISOSORBIDE MONONITRATE ER 60 MG PO TB24
30.0000 mg | ORAL_TABLET | Freq: Two times a day (BID) | ORAL | Status: DC
  Administered 2016-01-27 – 2016-01-31 (×8): 30 mg via ORAL
  Filled 2016-01-27 (×8): qty 1

## 2016-01-27 MED ORDER — SODIUM CHLORIDE 0.9% FLUSH
3.0000 mL | Freq: Two times a day (BID) | INTRAVENOUS | Status: DC
  Administered 2016-01-27 – 2016-01-30 (×5): 3 mL via INTRAVENOUS

## 2016-01-27 MED ORDER — INSULIN ASPART 100 UNIT/ML ~~LOC~~ SOLN
0.0000 [IU] | Freq: Every day | SUBCUTANEOUS | Status: DC
  Administered 2016-01-27 – 2016-01-28 (×2): 3 [IU] via SUBCUTANEOUS
  Administered 2016-01-29: 5 [IU] via SUBCUTANEOUS
  Administered 2016-01-30 (×2): 3 [IU] via SUBCUTANEOUS

## 2016-01-27 MED ORDER — ACETAMINOPHEN 325 MG PO TABS
650.0000 mg | ORAL_TABLET | ORAL | Status: DC | PRN
  Administered 2016-01-28: 650 mg via ORAL
  Filled 2016-01-27: qty 2

## 2016-01-27 MED ORDER — GLYBURIDE 5 MG PO TABS
5.0000 mg | ORAL_TABLET | Freq: Three times a day (TID) | ORAL | Status: DC
  Administered 2016-01-28 – 2016-01-31 (×10): 5 mg via ORAL
  Filled 2016-01-27 (×9): qty 1

## 2016-01-27 MED ORDER — IPRATROPIUM BROMIDE 0.02 % IN SOLN
0.5000 mg | RESPIRATORY_TRACT | Status: DC
  Administered 2016-01-27 – 2016-01-28 (×7): 0.5 mg via RESPIRATORY_TRACT
  Filled 2016-01-27 (×7): qty 2.5

## 2016-01-27 MED ORDER — ASPIRIN 81 MG PO CHEW
81.0000 mg | CHEWABLE_TABLET | Freq: Every day | ORAL | Status: DC
  Administered 2016-01-28 – 2016-01-31 (×4): 81 mg via ORAL
  Filled 2016-01-27 (×4): qty 1

## 2016-01-27 MED ORDER — SODIUM CHLORIDE 0.9% FLUSH
3.0000 mL | Freq: Two times a day (BID) | INTRAVENOUS | Status: DC
  Administered 2016-01-28 – 2016-01-30 (×3): 3 mL via INTRAVENOUS

## 2016-01-27 MED ORDER — LEVALBUTEROL TARTRATE 45 MCG/ACT IN AERO: 1.0000 | INHALATION_SPRAY | Freq: Four times a day (QID) | RESPIRATORY_TRACT | Status: DC | PRN

## 2016-01-27 MED ORDER — ENOXAPARIN SODIUM 40 MG/0.4ML ~~LOC~~ SOLN
40.0000 mg | SUBCUTANEOUS | Status: DC
  Administered 2016-01-27: 40 mg via SUBCUTANEOUS
  Filled 2016-01-27: qty 0.4

## 2016-01-27 MED ORDER — METHYLPREDNISOLONE SODIUM SUCC 125 MG IJ SOLR
60.0000 mg | Freq: Three times a day (TID) | INTRAMUSCULAR | Status: DC
  Administered 2016-01-27 – 2016-01-30 (×8): 60 mg via INTRAVENOUS
  Filled 2016-01-27 (×7): qty 2

## 2016-01-27 MED ORDER — ACETAMINOPHEN 650 MG RE SUPP: 650.0000 mg | Freq: Four times a day (QID) | RECTAL | Status: DC | PRN

## 2016-01-27 MED ORDER — ACETAMINOPHEN 325 MG PO TABS
650.0000 mg | ORAL_TABLET | Freq: Four times a day (QID) | ORAL | Status: DC | PRN
Start: 2016-01-27 — End: 2016-01-31

## 2016-01-27 NOTE — ED Notes (Signed)
Pt given fluids.  

## 2016-01-27 NOTE — ED Notes (Addendum)
BNP LAB CHANGE FROM 591 TO 354

## 2016-01-27 NOTE — ED Notes (Signed)
Pt undergoing xray

## 2016-01-27 NOTE — ED Provider Notes (Signed)
CSN: [id number]     Arrival date & time 01/27/16  1459 History   First MD Initiated Contact with Patient 01/27/16 1503     Chief Complaint  Patient presents with  . Shortness of Breath     (Consider location/radiation/quality/duration/timing/severity/associated sxs/prior Treatment) HPI 82 yo male presents with persistent shortness of breath. Was discharged from the hospital last week for COPD exacerbation. Was seen in follow-up by Dr. Sherwood Gambler and sent to the emergency department for evaluation. Patient states his shorts of breath has not worsened. He denies cough the wife states he does have some coughing at night. He has bilateral lower extremity edema which is unchanged. He denies any fever or chills. Denies any chest pain. He's been using his nebulized treatments at home. Recently finished course of azithromycin. Past Medical History  Diagnosis Date  . Respiratory failure (HCC)     secondary to pleural effusion, severe COPD, ongoing tobacco abuse, and congestive heart failure)  . Heart block     with sinus pauses (status post St. Jude pacemaker by Dr. Graciela Husbands)  . Chronic renal insufficiency   . Chronic atrial fibrillation (HCC)     (no Coumadin secondary to GI bleeding)  . Diabetes mellitus   . Coronary artery disease 2009    last catheterization in 2009 demonstrate a 3-vessel CAD . He underwent stenting with a right  coronary artery with a  PROMUS drug-eluting stent. He  also had a stenting of his circumflex with drug-eluting  stent previously))  . Cardiomyopathy     (EF 40%)  . Degenerative joint disease   . Hypertension   . Hyperlipidemia   . Peptic ulcer disease   . Peripheral vascular disease (HCC)   . Sinoatrial node dysfunction [facility])    Past Surgical History  Procedure Laterality Date  . Cholecystectomy    . Insert / replace / remove pacemaker      St. Jude   Family History  Problem Relation Age of Onset  . Coronary artery disease Neg Hx    Social History  Substance  Use Topics  . Smoking status: Former Smoker -- 1.50 packs/day for 60 years    Types: Cigarettes    Quit date: 06/15/2007  . Smokeless tobacco: None  . Alcohol Use: 0.0 oz/week    0 Standard drinks or equivalent per week     Comment: occasionally have "a beer"    Review of Systems  Constitutional: Negative for fever and chills.  Respiratory: Positive for cough, shortness of breath and wheezing.   Cardiovascular: Positive for leg swelling. Negative for chest pain and palpitations.  Gastrointestinal: Negative for nausea, vomiting, abdominal pain, diarrhea and constipation.  Musculoskeletal: Negative for myalgias, back pain, neck pain and neck stiffness.  Skin: Negative for rash and wound.  Neurological: Negative for dizziness, weakness, light-headedness, numbness and headaches.  All other systems reviewed and are negative.     Allergies  Albuterol; Statins; and Sulfonamide derivatives  Home Medications   Prior to Admission medications   Medication Sig Start Date End Date Taking? Authorizing Provider  aspirin 81 MG tablet Take 81 mg by mouth daily.      Historical Provider, MD  azithromycin (ZITHROMAX) 250 MG tablet Take one tablet po daily 01/21/16   Erick Blinks, MD  ezetimibe (ZETIA) 10 MG tablet Take 10 mg by mouth daily.      Historical Provider, MD  glyBURIDE (DIABETA) 5 MG tablet Take 10 mg by mouth 2 (two) times daily with a meal.  Historical Provider, MD  GLYSET 100 MG tablet Take 1 tablet by mouth 3 (three) times daily with meals.  11/27/15   Historical Provider, MD  guaiFENesin (MUCINEX) 600 MG 12 hr tablet Take 1 tablet (600 mg total) by mouth 2 (two) times daily. 01/21/16   Erick Blinks, MD  hydrALAZINE (APRESOLINE) 25 MG tablet 25 mg. Take 1/2 tablet by mouth three times daily    Historical Provider, MD  ipratropium-albuterol (DUONEB) 0.5-2.5 (3) MG/3ML SOLN Take 3 mLs by nebulization every 6 (six) hours as needed. 01/21/16   Erick Blinks, MD  isosorbide  mononitrate (IMDUR) 30 MG 24 hr tablet Take 30 mg by mouth 2 (two) times daily.      Historical Provider, MD  predniSONE (DELTASONE) 10 MG tablet Take 40mg  po daily for 2 days then 30mg  daily for 2 days then 20mg  daily for 2 days then 10mg  daily for 2 days then stop 01/21/16   Erick Blinks, MD   BP 167/82 mmHg  Pulse 73  Temp(Src) 96.7 F (35.9 C) (Axillary)  Resp 22  Ht 5\' 10"  (1.778 m)  Wt 179 lb (81.194 kg)  BMI 25.68 kg/m2  SpO2 96% Physical Exam  Constitutional: He is oriented to person, place, and time. He appears well-developed and well-nourished. He appears distressed.  Mild respiratory distress though speaking in full sentences  HENT:  Head: Normocephalic and atraumatic.  Mouth/Throat: Oropharynx is clear and moist. No oropharyngeal exudate.  Eyes: EOM are normal. Pupils are equal, round, and reactive to light.  Neck: Normal range of motion. Neck supple. No JVD present.  Cardiovascular: Normal rate and regular rhythm.  Exam reveals no gallop and no friction rub.   No murmur heard. Pulmonary/Chest: Effort normal. No respiratory distress. He has wheezes. He has no rales.  Diffuse expiratory wheezing and crackles.  Abdominal: Soft. Bowel sounds are normal. He exhibits no distension and no mass. There is no tenderness. There is no rebound and no guarding.  Musculoskeletal: Normal range of motion. He exhibits edema. He exhibits no tenderness.  2+ bilateral lower extremity pretibial pitting edema. No calf swelling or asymmetry.  Neurological: He is alert and oriented to person, place, and time.  Moving all extremities without focal deficit. Sensation is intact.  Skin: Skin is warm and dry. No rash noted. No erythema.  Psychiatric: He has a normal mood and affect. His behavior is normal.  Nursing note and vitals reviewed.   ED Course  Procedures (including critical care time) Labs Review Labs Reviewed  CBC WITH DIFFERENTIAL/PLATELET - Abnormal; Notable for the following:     WBC 18.2 (*)    RBC 4.08 (*)    Hemoglobin 12.3 (*)    HCT 38.0 (*)    Neutro Abs 16.8 (*)    Lymphs Abs 0.5 (*)    All other components within normal limits  COMPREHENSIVE METABOLIC PANEL - Abnormal; Notable for the following:    Potassium 6.0 (*)    CO2 21 (*)    Glucose, Bld 280 (*)    BUN 49 (*)    Creatinine, Ser 1.81 (*)    Calcium 8.4 (*)    GFR calc non Af Amer 32 (*)    GFR calc Af Amer 38 (*)    All other components within normal limits  BRAIN NATRIURETIC PEPTIDE - Abnormal; Notable for the following:    B Natriuretic Peptide 354.0 (*)    All other components within normal limits  I-STAT TROPOININ, ED    Imaging Review Dg  Chest Port 1 View  01/27/2016  CLINICAL DATA:  Shortness of breath for 1 year, worsening over the last week. EXAM: PORTABLE CHEST 1 VIEW COMPARISON:  01/19/2016. FINDINGS: Trachea is midline. Heart size stable. Thoracic aorta is calcified. Left subclavian pacemaker lead tip projects over the right ventricle. There may be mild volume loss at the left costophrenic angle. Lungs are otherwise clear. No pleural fluid. IMPRESSION: No acute findings. Electronically Signed   By: Leanna Battles M.D.   On: 01/27/2016 15:27   I have personally reviewed and evaluated these images and lab results as part of my medical decision-making.   EKG Interpretation   Date/Time:  Wednesday January 27 2016 15:17:00 EDT Ventricular Rate:  73 PR Interval:    QRS Duration: 126 QT Interval:  408 QTC Calculation: 450 R Axis:   -64 Text Interpretation:  Atrial fibrillation Left bundle branch block  Confirmed by Shianna Bally  MD, Mj Willis ([id number]) on 01/27/2016 3:22:04 PM      MDM   Final diagnoses:  COPD exacerbation (HCC)  Hypervolemia, unspecified hypervolemia type    Patient with some improvement of air movement. Suspect some degree of fluid overload. Mild elevation in BNP. Will give dose of Lasix. Evidence of infection at this point. Patient has elevated potassium though  there is been no worsening in his renal function. No EKG changes. Suspect possible lab air. We'll repeat lab and give insulin. Discussed with [facility] hospitalist and will admit to telemetry bed.    Loren Racer, MD 01/27/16 1710

## 2016-01-27 NOTE — ED Notes (Signed)
RT at bedside.

## 2016-01-27 NOTE — ED Notes (Signed)
MD at bedside. 

## 2016-01-27 NOTE — Therapy (Addendum)
[name], [place], [address] Phone: [phone]   Fax:  [phone]  Wound Care Therapy  Patient Details  Name: [name] MRN: [id number] Date of Birth: Oct 08, 1927 Referring Provider: Dr. Kathie Dike  Encounter Date: 01/27/2016      PT End of Session - 01/27/16 1608    Visit Number 8   Number of Visits 24  new eval for gait and balance will extend sessions.   Date for PT Re-Evaluation 02/26/16   Authorization Type Medicare/Tricare for life    Authorization Time Period 12/29/15 to 02/28/16   Authorization - Visit Number 8   Authorization - Number of Visits 20   PT Start Time 1030   PT Stop Time 1120   PT Time Calculation (min) 50 min   Equipment Utilized During Treatment Gait belt   Activity Tolerance Patient tolerated treatment well   Behavior During Therapy WFL for tasks assessed/performed      Past Medical History  Diagnosis Date  . Respiratory failure (HCC)     secondary to pleural effusion, severe COPD, ongoing tobacco abuse, and congestive heart failure)  . Heart block     with sinus pauses (status post St. Jude pacemaker by Dr. Caryl Comes)  . Chronic renal insufficiency   . Chronic atrial fibrillation (HCC)     (no Coumadin secondary to GI bleeding)  . Diabetes mellitus   . Coronary artery disease 2009    last catheterization in 2009 demonstrate a 3-vessel CAD . He underwent stenting with a right  coronary artery with a  PROMUS drug-eluting stent. He  also had a stenting of his circumflex with drug-eluting  stent previously))  . Cardiomyopathy     (EF 40%)  . Degenerative joint disease   . Hypertension   . Hyperlipidemia   . Peptic ulcer disease   . Peripheral vascular disease ([place])   . Sinoatrial node dysfunction [facility])     Past Surgical History  Procedure Laterality Date  . Cholecystectomy    . Insert / replace / remove pacemaker      St. Jude    There were no vitals filed for this  visit.       Subjective Assessment - 01/27/16 1210    Subjective [name] is an 82 yo male who has been being seen for skilled physical therapy for leg wounds.  He was admitted to the hospital on 01/20/2016 and discharged on 01/21/2016 for exacerbation of COPD.  He is having more trouble walking and now has orders for skilled PT for his ambulation.     Pertinent History COPD    How long can you sit comfortably? no problem   How long can you stand comfortably? less than a minute   How long can you walk comfortably? 24 feet with rolling walker, (tested)   Patient Stated Goals to be able to walk better.    Currently in Pain? No/denies          [facility] PT Assessment - 01/27/16 0001    Assessment   Medical Diagnosis difficulty walking    Referring Provider Dr. Kathie Dike   Onset Date/Surgical Date 01/20/16   Prior Therapy for wound but not for strengthening and gait    Precautions   Precautions Fall   Restrictions   Weight Bearing Restrictions No   Balance Screen   Has the patient fallen in the past 6 months Yes   How many times? --  unsure   Has  the patient had a decrease in activity level because of a fear of falling?  Yes   Is the patient reluctant to leave their home because of a fear of falling?  No   Home Ecologist residence   Prior Function   Level of Independence Independent with household mobility with device   Vocation Retired   Leisure none   Cognition   Overall Cognitive Status Within Functional Limits for tasks assessed   Functional Tests   Functional tests Single leg stance   Single Leg Stance   Comments unable on either LE    Posture/Postural Control   Posture/Postural Control Postural limitations   Postural Limitations Rounded Shoulders;Forward head;Increased thoracic kyphosis   ROM / Strength   AROM / PROM / Strength Strength   Strength   Strength Assessment Site Hip;Knee;Ankle   Right/Left Hip Right;Left   Right Hip  Flexion 3/5   Right Hip Extension 2/5   Right Hip ABduction 3/5   Left Hip Flexion 3/5   Left Hip Extension 2/5   Left Hip ABduction 3-/5   Right/Left Knee Right;Left   Right Knee Flexion 3/5   Right Knee Extension 3+/5   Left Knee Flexion 3/5   Left Knee Extension 3+/5   Right/Left Ankle Right;Left   Right Ankle Dorsiflexion 3+/5   Left Ankle Dorsiflexion 3+/5   Transfers   Transfers Stand Pivot Transfers   Stand Pivot Transfers 4: Min assist   Ambulation/Gait   Ambulation Distance (Feet) 24 Feet   Assistive device Rolling walker   Gait Pattern Decreased stride length;Decreased dorsiflexion - right;Decreased dorsiflexion - left;Shuffle   Gait velocity 45.42                 Wound Therapy - 01/27/16 1137    Subjective Pt states that he has no pain today.   Patient and Family Stated Goals wounds    Prior Treatments MD   Pain Assessment No/denies pain   Pain Score 0-No pain   Evaluation and Treatment Procedures Explained to Patient/Family Yes   Evaluation and Treatment Procedures agreed to   Wound Properties Date First Assessed: 01/08/16 Time First Assessed: 0962 Wound Type: Degloving Location: Toe (Comment  which one) Location Orientation: Left Wound Description (Comments): great toe  Final Assessment Date: 01/27/16 Final Assessment Time: 1039   Wound Properties Date First Assessed: 12/29/15 Wound Type: Laceration Location: Tibial Location Orientation: Left Final Assessment Date: 01/27/16 Final Assessment Time: 1025   Wound Properties Date First Assessed: 12/29/15 Wound Type: Laceration Location: Tibial Location Orientation: Right Present on Admission: Yes   Dressing Type Gauze (Comment)   Dressing Changed Changed   Dressing Status Old drainage   Dressing Change Frequency PRN   Site / Wound Assessment Granulation tissue   % Wound base Red or Granulating 100%   % Wound base Yellow 0%   Peri-wound Assessment Edema   Wound Length (cm) 3 cm   Wound Width (cm) 1 cm    Wound Depth (cm) 0 cm   Tunneling (cm) 0 second wound inferior to this measuring 1.5 x.5 cm    Undermining (cm) 0   Margins Attached edges (approximated)   Closure None   Drainage Amount None   Drainage Description No odor   Treatment Cleansed;Debridement (Selective)  compression with coban   Selective Debridement - Location shin and Rt great toe    Selective Debridement - Tools Used Forceps   Selective Debridement - Tissue Removed --  dry skin  Wound Therapy - Clinical Statement Pt has been admitted into the hospital on 4/19  and discharged on 4/20 for weakness and COPD.  Mr. Silveria was evaluated for this seperately.  Mr. Perleberg may benefit more from home health therapy as both he and his wife have significant mobility issues.  Therapist had to stoassist patient's wife to the care as well as she lost her balance twice.  Pt no longer needs skilled care on Lt LE. or Rt toe.  PT Rt LE shin cleansed debrided and dressed with xeroform, kerlix and coban.  Anticipate next patient to be discharged from wound care next treatment.  Pt   Pt Rt L   Wound Therapy - Functional Problem List pain, difficulty walking    Factors Delaying/Impairing Wound Healing Altered sensation;Diabetes Mellitus;Immobility;Polypharmacy;Tobacco use;Vascular compromise   Wound Therapy - Frequency Other (comment)  2x a week    Wound Therapy - Current Recommendations PT   Wound Plan probable discharge from wound therapy next visit.  Will speak to pt as to whether he would prefer home health therapy to come and work with him on his strength and balance.                  PT Education - 01/27/16 1152    Education provided Yes   Education Details dressing should not be painful and to take off if it is.  Pt should be wearing his compression garment.    Person(s) Educated Patient   Methods Explanation   Comprehension Verbalized understanding          PT Short Term Goals - 01/27/16 1558    PT SHORT TERM GOAL #1    Title Patient will demonstrate slough 0% bilateral wounds in order to demonstrate resoluation of wound infection and improvements in healing status    Time 4   Period Weeks   Status Achieved   PT SHORT TERM GOAL #2   Title Patient will demonstrate closure of both wounsd by at least 60% in order to demonstrate improved wound healing status    Time 4   Period Weeks   Status Achieved   PT SHORT TERM GOAL #3   Title Patient's wife to be able to correctly identify signs/symptoms of wound infection and demonstrate proper wound bandaging in order to increase independence in managing condition    Time 4   Period Weeks   PT SHORT TERM GOAL #4   Title Pt strength to be increased by one grade to allow pt to come sit to stand independent and safely     Baseline 01/30/16: mod asist    Time 3   Period Weeks   Status New   PT SHORT TERM GOAL #5   Title Patient to be able to ambulate with walker for 200 feet to allow patient to be able to wallk into medical appointments.    Time 3   Period Weeks   Status New   Additional Short Term Goals   Additional Short Term Goals Yes   PT SHORT TERM GOAL #6   Title Patient to be able to single leg stance for five seconds on both LE to reduce risk of falls.    Time 3   Period Weeks   Status New           PT Long Term Goals - 01/27/16 1602    PT LONG TERM GOAL #1   Title Patient to demonstrate full wound closure bilaterally in order to demonstrate improved health  condition and would healing    Time 8   Period Weeks   Status Partially Met   PT LONG TERM GOAL #2   Title Patient and his wife to be independent in exercises promoting bloodflow to lower extremities, including ankle pumps and toe/heel raises, in order to assist in reducing complications from vascualr status    Time 8   Period Weeks   Status Not Met   PT LONG TERM GOAL #3   Title Patient and his wife to be independent in safety awareness and technique in order to avoid reinjury of B LEs in  order to maintain good health status and to prevent injury    Time 8   Period Weeks   Status On-going   PT LONG TERM GOAL #4   Title Patient core and bilateral lower extremity strength to be increased to 4/5 to allow patient to step in and out of his truck without difficulty.   Baseline 01/30/2016:  see eval    Time 8   Period Weeks   Status New   PT LONG TERM GOAL #5   Title Patient to be walking with a walker for 578f to allow pt to get the mail    Baseline 01/30/2016:  pt was able to walk for 24 feet when he needed to rest.  Pt Ox was at 90.    Time 6   Period Weeks   Status New               Plan - 01/27/16 1610    Clinical Impression Statement Mr. BMaffeois a known patient to this facility.  He has been being seen for wound care to bilateral lower extremities.  He was admitted into the hospital on April 19th due to acute exacerbation of COPD.  Upon discharge Mr. BFrayrewas referred to out-patient physical therapy to attempt to improve his gait and balance to reduce his risk of falls.   Examination demonstrates postrual dysfunction, decreased balance, decreased core and bilateral lower extremity stength, as well as activity tolerance.  Mr. BPilarwound therapy will most likely be discharged next visit but he will benefit from continued skilled physcial therapy to address his other deficits as mentioned to improve his ambulation ability and reduce his risk of falls.    Rehab Potential Good   PT Frequency 2x / week   PT Duration 6 weeks  six weeks more stating today 4/29   PT Treatment/Interventions Stair training;Gait training;Functional mobility training;Therapeutic activities;Therapeutic exercise;Balance training;Patient/family education;Energy conservation;Passive range of motion;Manual techniques;Wheelchair mobility training   PT Next Visit Plan Begin wheelchair exercises :  ankle pumps, long arc quad, glut sets, scapular retraction, heel raises, minisquats and ambulation with  rolling walker.   PT Home Exercise Plan give next treatment.   Consulted and Agree with Plan of Care Patient      Patient will benefit from skilled therapeutic intervention in order to improve the following deficits and impairments:  Abnormal gait, Decreased activity tolerance, Decreased balance, Decreased endurance, Difficulty walking, Decreased mobility, Decreased strength, Postural dysfunction  Visit Diagnosis: Difficulty in walking, not elsewhere classified - Plan: PT plan of care cert/re-cert  Unspecified open wound, left lower leg, sequela  Unspecified open wound, right lower leg, sequela  Muscle weakness (generalized) - Plan: PT plan of care cert/re-cert  Unsteadiness on feet - Plan: PT plan of care cert/re-cert  Other abnormalities of gait and mobility - Plan: PT plan of care cert/re-cert      G-Codes -  01/27/16 1624    Functional Assessment Tool Used clinical judgement :     Functional Limitation Mobility: Walking and moving around   Mobility: Walking and Moving Around Current Status [phone]) At least 80 percent but less than 100 percent impaired, limited or restricted   Mobility: Walking and Moving Around Goal Status [phone]) At least 60 percent but less than 80 percent impaired, limited or restricted       Problem List Patient Active Problem List   Diagnosis Date Noted  . CKD (chronic kidney disease) stage 3, GFR 30-59 ml/min 01/20/2016  . Acute respiratory failure with hypoxia ([place]) 01/20/2016  . CHF (congestive heart failure) ([place]) 04/18/2014  . COPD exacerbation ([place]) 04/18/2014  . Acute on chronic systolic heart failure ([place]) 10/14/2011  . Sinoatrial node dysfunction (HCC)   . TOBACCO USER 01/27/2010  . Coronary atherosclerosis 01/27/2010  . Cardiac pacemaker in situ 07/01/2009  . DIABETES MELLITUS 01/20/2009  . HYPERLIPIDEMIA 01/20/2009  . Essential hypertension 01/20/2009  . Atrial fibrillation ([place]) 01/20/2009  . ANEMIA, NORMOCYTIC 12/17/2008  .  MELENA, HX OF 12/17/2008    Rayetta Humphrey, PT CLT [phone] 01/27/2016, 4:33 PM  [facility] [address] [place], [place], [address] Phone: [phone]   Fax:  [phone]  Name: [name] MRN: [id number] Date of Birth: 26-Jun-1928

## 2016-01-27 NOTE — ED Notes (Signed)
Patient with shortness of breath x 1 year per wife, but worse over last week. Seen at Dr Fusco's office and sent for further Eval.

## 2016-01-27 NOTE — Addendum Note (Signed)
Addended by: Bella Kennedy on: 01/27/2016 04:34 PM   Modules accepted: Orders

## 2016-01-27 NOTE — Patient Instructions (Signed)
Ankle Pump    Extend, lift, and hold leg still. Point toes up, then extend toes away. Repeat 10___ times each leg. Do _1__ sessions per day. NOTE: If unable to lift leg, lift ball of foot then lift heel.  Copyright  VHI. All rights reserved.  Knee Extension (Sitting)    Place _0___ pound weight on left ankle and straighten knee fully, lower slowly. Repeat __10__ times per set. Do 1____ sets per session. Do _2___ sessions per day.  http://orth.exer.us/732   Copyright  VHI. All rights reserved.  Isometric Gluteals    Tighten buttock muscles.  May do sitting  Repeat __10__ times per set. Do __01__ sets per session. Do _3___ sessions per day.  http://orth.exer.us/1126   Copyright  VHI. All rights reserved.  Strengthening: Hip Adduction - Isometric   Do sitting in a chair  With ball or folded pillow between knees, squeeze knees together. Hold __3__ seconds. Repeat 10____ times per set. Do1 ____ sets per session. Do _3___ sessions per day.  http://orth.exer.us/612   Copyright  VHI. All rights reserved.  Heel Raise: Bilateral (Standing)    Rise on balls of feet. Repeat __10_ times per set. Do ___1_ sets per session. Do ___2_ sessions per day.  http://orth.exer.us/38   Copyright  VHI. All rights reserved.

## 2016-01-27 NOTE — H&P (Signed)
TRH H&P   Patient Demographics:    [name], is a 82 y.o. male  MRN: [id number]   DOB - 25-Oct-1927  Admit Date - 01/27/2016  Outpatient Primary MD for the patient is [name]., MD  Referring MD: Dr. Ranae Palms  Outpatient Specialists:  Wound care clinic  Patient coming from: Home  Chief Complaint  Patient presents with  . Shortness of Breath      HPI:   82 year old male with history of COPD, ( not on home O2, quit smoking 2 years ago, currently smoking the cigarettes), CAD stage III, history of systolic CHF (last echo in 2015 with EF of 40-45%), A. fib not on anticoagulation due to GI bleed, type 2 diabetes mellitus on oral hypoglycemics, cardiac pacemaker was recently hospitalized from 4/18-4/20 for acute hypoxic respiratory failure to COPD exacerbation. Patient was discharged home but the wife he was never back to his baseline and continued to have dyspnea on minimal exertion. Symptoms progressed and patient became dyspneic on rest, had nonproductive cough for the past 2 days. Patient took his nebulizers and inhaler at home without much relief. He was also discharged on prednisone taper and azithromycin. Patient was seen at the wound care center today (for his right leg wound) and was found to be increasingly short of breath and was thus sent to the ED.  Patient reports increasing dyspnea, has some orthopnea but no PND. Denies chest pain, palpitations, headache, blurred vision, nausea, vomiting, bowel or urinary symptoms. He has bilateral chronic leg edema which per wife has unchanged. Reports being compliant with his medications.  Vitals in the ED showed temperature 96.7 F, tachypneic up to 30, normal heart rate and blood pressure. O2 sat dropped to 87% on room air. ( one blood pressure reading of 99/76  was an error).  Blood work showed WBC of 18.2 K, hemoglobin  of 12.3 and normal platelets. Chemistry showed sodium 138, K of 6, CO2 of 21, creatinine 1.81 and glucose of 280. BNP of 354. LFTs normal. Chest x-ray unremarkable. EKG showed A. Fib@73  with LBBB which is old.  Patient placed on 2 L via nasal cannula, given 2 rounds of albuterol nebulizer, 40 mg IV Lasix and 125 mg IV Solu-Medrol. He was able to speak in full sentences and not in distress. Given his hyperkalemia he was given 10 units of aspart insulin. Repeat potassium improved to 5.7.  Hospitalist admission requested to telemetry.       Review of systems:    In addition to the HPI above No Fever-chills, No Headache, No changes with Vision or hearing, No problems swallowing food or Liquids, No Chest pain, has dry cough and shortness of breath No abdominal pain, No Nausea or Vommitting, Bowel movements are regular, No Blood in stool or Urine, No dysuria, No new skin rashes or bruises, has chronic rt leg wound No new  joints pains-aches,  No new weakness, tingling, numbness in any extremity, No recent weight gain or loss, No polyuria, polydypsia or polyphagia, No significant Mental Stressors.  A full 10 point Review of Systems was done, except as stated above, all other Review of Systems were negative.   With Past History of the following :    Past Medical History  Diagnosis Date  . Respiratory failure (HCC)     secondary to pleural effusion, severe COPD, ongoing tobacco abuse, and congestive heart failure)  . Heart block     with sinus pauses (status post [employer] Jude pacemaker by Dr. Graciela Husbands)  . Chronic renal insufficiency   . Chronic atrial fibrillation (HCC)     (no Coumadin secondary to GI bleeding)  . Diabetes mellitus   . Coronary artery disease 2009    last catheterization in 2009 demonstrate a 3-vessel CAD . He underwent stenting with a right  coronary artery with a  PROMUS drug-eluting stent. He  also had a stenting of his circumflex with drug-eluting  stent  previously))  . Cardiomyopathy     (EF 40%)  . Degenerative joint disease   . Hypertension   . Hyperlipidemia   . Peptic ulcer disease   . Peripheral vascular disease (HCC)   . Sinoatrial node dysfunction [facility])       Past Surgical History  Procedure Laterality Date  . Cholecystectomy    . Insert / replace / remove pacemaker      St. Jude      Social History:     Social History  Substance Use Topics  . Smoking status: Former Smoker -- 1.50 packs/day for 60 years    Types: Cigarettes    Quit date: 06/15/2007  . Smokeless tobacco: Not on file  . Alcohol Use: 0.0 oz/week    0 Standard drinks or equivalent per week     Comment: occasionally have "a beer"     Lives - With wife at home  Mobility -  ambulates in and around the house but for the past week he has difficulty walking even to the bathroom with dyspnea.      Family History :     Family History  Problem Relation Age of Onset  . Coronary artery disease Neg Hx       Home Medications:   Prior to Admission medications   Medication Sig Start Date End Date Taking? Authorizing Provider  aspirin 81 MG tablet Take 81 mg by mouth daily.     Yes Historical Provider, MD  ezetimibe (ZETIA) 10 MG tablet Take 10 mg by mouth daily.     Yes Historical Provider, MD  furosemide (LASIX) 40 MG tablet Take 40 mg by mouth daily.   Yes Historical Provider, MD  glyBURIDE (DIABETA) 5 MG tablet Take 5 mg by mouth 3 (three) times daily.    Yes Historical Provider, MD  GLYSET 100 MG tablet Take 1 tablet by mouth 3 (three) times daily with meals.  11/27/15  Yes Historical Provider, MD  guaiFENesin (MUCINEX) 600 MG 12 hr tablet Take 1 tablet (600 mg total) by mouth 2 (two) times daily. 01/21/16  Yes Erick Blinks, MD  hydrALAZINE (APRESOLINE) 25 MG tablet Take 12.5 mg by mouth 3 (three) times daily.    Yes Historical Provider, MD  ipratropium-albuterol (DUONEB) 0.5-2.5 (3) MG/3ML SOLN Take 3 mLs by nebulization every 6 (six) hours as  needed. 01/21/16  Yes Erick Blinks, MD  isosorbide mononitrate (IMDUR) 30 MG 24 hr tablet Take  30 mg by mouth 2 (two) times daily.     Yes Historical Provider, MD  levalbuterol (XOPENEX HFA) 45 MCG/ACT inhaler Inhale 1 puff into the lungs every 6 (six) hours as needed for wheezing.   Yes Historical Provider, MD  metFORMIN (GLUCOPHAGE) 500 MG tablet Take 1,000 mg by mouth 2 (two) times daily with a meal.   Yes Historical Provider, MD  potassium chloride SA (K-DUR,KLOR-CON) 20 MEQ tablet Take 20 mEq by mouth daily.   Yes Historical Provider, MD  predniSONE (DELTASONE) 10 MG tablet Take 40mg  po daily for 2 days then 30mg  daily for 2 days then 20mg  daily for 2 days then 10mg  daily for 2 days then stop Patient taking differently: Take 10 mg by mouth daily. Original course: Take 40mg  po daily for 2 days then 30mg  daily for 2 days then 20mg  daily for 2 days then 10mg  daily for 2 days then stop 01/21/16  Yes Erick Blinks, MD     Allergies:     Allergies  Allergen Reactions  . Albuterol     REACTION: agitation  . Statins   . Sulfonamide Derivatives Other (See Comments)    unknown     Physical Exam:   Vitals  Blood pressure 167/82, pulse 73, temperature 96.7 F (35.9 C), temperature source Axillary, resp. rate 22, height 5\' 10"  (1.778 m), weight 81.194 kg (179 lb), SpO2 96 %.    General : Elderly male lying in bed appears fatigued, tachypneic   HEENT: No pallor, moist oral mucosa, no JVD, no cervical lymphadenopathy, supple neck Chest: Coarse breath sounds bilaterally, scattered rhonchi, no crackles CVS: S1 and S2 irregular, no murmurs rub or gallop GI: Soft, nondistended, nontender, bowel sounds present Musculoskeletal: Warm, trace edema over left leg, Ace wrap on the right leg over chronic wound CNS: Alert and oriented       Data Review:    CBC  Recent Labs Lab 01/27/16 1511 01/27/16 1714  WBC 18.2*  --   HGB 12.3* 13.6  HCT 38.0* 40.0  PLT 193  --   MCV 93.1  --   MCH  30.1  --   MCHC 32.4  --   RDW 15.3  --   LYMPHSABS 0.5*  --   MONOABS 0.9  --   EOSABS 0.0  --   BASOSABS 0.0  --    ------------------------------------------------------------------------------------------------------------------  Chemistries   Recent Labs Lab 01/21/16 0705 01/27/16 1511 01/27/16 1714  NA 134* 138 138  K 4.7 6.0* 5.7*  CL 106 109 105  CO2 20* 21*  --   GLUCOSE 259* 280* 280*  BUN 49* 49* 46*  CREATININE 1.88* 1.81* 1.70*  CALCIUM 8.6* 8.4*  --   AST  --  28  --   ALT  --  30  --   ALKPHOS  --  59  --   BILITOT  --  0.8  --    ------------------------------------------------------------------------------------------------------------------ estimated creatinine clearance is 32.8 mL/min (by C-G formula based on Cr of 1.7). ------------------------------------------------------------------------------------------------------------------ No results for input(s): TSH, T4TOTAL, T3FREE, THYROIDAB in the last 72 hours.  Invalid input(s): FREET3  Coagulation profile No results for input(s): INR, PROTIME in the last 168 hours. ------------------------------------------------------------------------------------------------------------------- No results for input(s): DDIMER in the last 72 hours. -------------------------------------------------------------------------------------------------------------------  Cardiac Enzymes No results for input(s): CKMB, TROPONINI, MYOGLOBIN in the last 168 hours.  Invalid input(s): CK ------------------------------------------------------------------------------------------------------------------    Component Value Date/Time   BNP 354.0* 01/27/2016 1511     ---------------------------------------------------------------------------------------------------------------  Urinalysis  Component Value Date/Time   COLORURINE YELLOW 03/12/2009 0433   APPEARANCEUR CLEAR 03/12/2009 0433   LABSPEC 1.010 03/12/2009 0433     PHURINE 5.5 03/12/2009 0433   GLUCOSEU NEGATIVE 03/12/2009 0433   HGBUR MODERATE* 03/12/2009 0433   BILIRUBINUR NEGATIVE 03/12/2009 0433   KETONESUR NEGATIVE 03/12/2009 0433   PROTEINUR NEGATIVE 03/12/2009 0433   UROBILINOGEN 0.2 03/12/2009 0433   NITRITE NEGATIVE 03/12/2009 0433   LEUKOCYTESUR NEGATIVE 03/12/2009 0433    ----------------------------------------------------------------------------------------------------------------   Imaging Results:    Dg Chest Port 1 View  01/27/2016  CLINICAL DATA:  Shortness of breath for 1 year, worsening over the last week. EXAM: PORTABLE CHEST 1 VIEW COMPARISON:  01/19/2016. FINDINGS: Trachea is midline. Heart size stable. Thoracic aorta is calcified. Left subclavian pacemaker lead tip projects over the right ventricle. There may be mild volume loss at the left costophrenic angle. Lungs are otherwise clear. No pleural fluid. IMPRESSION: No acute findings. Electronically Signed   By: Leanna Battles M.D.   On: 01/27/2016 15:27    My personal review of EKG: A. fib at 76, LBBB, no new changes   Assessment & Plan:    Principal Problem: Acute hypoxic respiratory failure Secondary to COPD exacerbation and acute systolic CHF. Admit to telemetry. Symptoms better after treatment received in ED. Further plan as outlined below.  Active Problems: Acute COPD exacerbation -Sats stable on 2 L via nasal cannula. IV Solu-Medrol 60 mg every 8 hours, scheduled nebs with Xopenex and ipratropium every 4 hours. Add Pulmicort nebs twice a day. Add Mucinex twice a day. Patient was recently treated with azithromycin IV have not added further antibiotics. -Patient uses cigarettes and I have counseled on quitting that as well. - assess need for home O2 prior to discharge.  Acute systolic CHF I will place him on IV Lasix 40 mg twice a day (on 20 mg oral daily at home). Monitor daily weight and strict I/O. Monitor daily renal function. Hold potassium supplements  given hyperkalemia. Check 2-D echo. Continue aspirin. Will add bisoprolol. Continue hydralazine, Imdur and Zetia. Not on ACEi/ ARB due to CKD.     Essential hypertension Stable. Resume Imdur and hydralazine. We'll add bisoprolol.   Diabetes mellitus type 2 Hold metformin and Glyset. Continue glyburide. Monitor on sliding scale coverage. Received 10 units of aspart in the ED given elevated blood glucose (I suspect this is due to steroid)   CK D stage III At baseline renal function (1.7-1.9.)  Symptomatic sinus bradycardia status post PPM Follows with Dr. Ladona Ridgel.  A. fib Not on anticoagulation due to GI bleed. Continue aspirin. Added beta blocker.  Generalized weakness PT consult.  DVT Prophylaxis Heparin -  Lovenox   AM Labs Ordered, also please review Full Orders  Family Communication: Wife and daughter at bedside.  CODE STATUS: Full code  Likely DC to:  [facility] services (RN and PT), need to assess need for home O2.   Condition GUARDED    Consults called : none  Admission status: Inpatient   Time spent in minutes : 70   Eddie North M.D on 01/27/2016 at 5:38 PM  Between 7am to 7pm - Pager - [phone]. After 7pm go to [email or web address] - password [facility]  [facility] - Office  [phone]

## 2016-01-28 ENCOUNTER — Inpatient Hospital Stay (HOSPITAL_COMMUNITY): Payer: Medicare Other

## 2016-01-28 DIAGNOSIS — E0865 Diabetes mellitus due to underlying condition with hyperglycemia: Secondary | ICD-10-CM

## 2016-01-28 DIAGNOSIS — I509 Heart failure, unspecified: Secondary | ICD-10-CM

## 2016-01-28 LAB — GLUCOSE, CAPILLARY
GLUCOSE-CAPILLARY: 338 mg/dL — AB (ref 65–99)
Glucose-Capillary: 327 mg/dL — ABNORMAL HIGH (ref 65–99)
Glucose-Capillary: 245 mg/dL — ABNORMAL HIGH (ref 65–99)
Glucose-Capillary: 175 mg/dL — ABNORMAL HIGH (ref 65–99)

## 2016-01-28 LAB — BASIC METABOLIC PANEL
ANION GAP: 12 (ref 5–15)
BUN: 50 mg/dL — ABNORMAL HIGH (ref 6–20)
CHLORIDE: 102 mmol/L (ref 101–111)
CO2: 22 mmol/L (ref 22–32)
Calcium: 8.4 mg/dL — ABNORMAL LOW (ref 8.9–10.3)
Creatinine, Ser: 1.97 mg/dL — ABNORMAL HIGH (ref 0.61–1.24)
GFR calc non Af Amer: 29 mL/min — ABNORMAL LOW (ref >60)
GFR, EST AFRICAN AMERICAN: 34 mL/min — AB (ref >60)
Glucose, Bld: 384 mg/dL — ABNORMAL HIGH (ref 65–99)
Potassium: 4.9 mmol/L (ref 3.5–5.1)
Sodium: 136 mmol/L (ref 135–145)

## 2016-01-28 LAB — ECHOCARDIOGRAM COMPLETE
Height: 70 in
WEIGHTICAEL: 2881.85 [oz_av]

## 2016-01-28 MED ORDER — FUROSEMIDE 40 MG PO TABS
40.0000 mg | ORAL_TABLET | Freq: Every day | ORAL | Status: DC
  Administered 2016-01-28 – 2016-01-31 (×4): 40 mg via ORAL
  Filled 2016-01-28 (×4): qty 1

## 2016-01-28 MED ORDER — LEVOFLOXACIN 500 MG PO TABS
500.0000 mg | ORAL_TABLET | ORAL | Status: DC
  Administered 2016-01-30: 500 mg via ORAL
  Filled 2016-01-28 (×2): qty 1

## 2016-01-28 MED ORDER — INSULIN GLARGINE 100 UNIT/ML ~~LOC~~ SOLN
10.0000 [IU] | Freq: Every day | SUBCUTANEOUS | Status: DC
  Administered 2016-01-28 – 2016-01-30 (×3): 10 [IU] via SUBCUTANEOUS
  Filled 2016-01-28 (×4): qty 0.1

## 2016-01-28 MED ORDER — IPRATROPIUM BROMIDE 0.02 % IN SOLN
0.5000 mg | Freq: Four times a day (QID) | RESPIRATORY_TRACT | Status: DC
  Administered 2016-01-29 – 2016-01-30 (×5): 0.5 mg via RESPIRATORY_TRACT
  Filled 2016-01-28 (×5): qty 2.5

## 2016-01-28 MED ORDER — LEVOFLOXACIN 750 MG PO TABS
750.0000 mg | ORAL_TABLET | Freq: Once | ORAL | Status: AC
  Administered 2016-01-28: 750 mg via ORAL
  Filled 2016-01-28: qty 1

## 2016-01-28 MED ORDER — GUAIFENESIN ER 600 MG PO TB12
1200.0000 mg | ORAL_TABLET | Freq: Two times a day (BID) | ORAL | Status: DC
  Administered 2016-01-28 – 2016-01-31 (×7): 1200 mg via ORAL
  Filled 2016-01-28 (×7): qty 2

## 2016-01-28 MED ORDER — ENOXAPARIN SODIUM 30 MG/0.3ML ~~LOC~~ SOLN
30.0000 mg | SUBCUTANEOUS | Status: DC
  Administered 2016-01-28 – 2016-01-30 (×2): 30 mg via SUBCUTANEOUS
  Filled 2016-01-28 (×2): qty 0.3

## 2016-01-28 MED ORDER — PERFLUTREN LIPID MICROSPHERE
1.0000 mL | INTRAVENOUS | Status: AC | PRN
Start: 2016-01-28 — End: 2016-01-28
  Administered 2016-01-28: 4 mL via INTRAVENOUS
  Filled 2016-01-28: qty 10

## 2016-01-28 MED ORDER — LEVALBUTEROL HCL 0.63 MG/3ML IN NEBU
0.6300 mg | INHALATION_SOLUTION | Freq: Four times a day (QID) | RESPIRATORY_TRACT | Status: DC
  Administered 2016-01-29 – 2016-01-30 (×5): 0.63 mg via RESPIRATORY_TRACT
  Filled 2016-01-28 (×5): qty 3

## 2016-01-28 MED ORDER — LEVOFLOXACIN 500 MG PO TABS: 500.0000 mg | ORAL_TABLET | ORAL | Status: DC

## 2016-01-28 NOTE — Consult Note (Signed)
   [facility] Inpatient Consult   01/28/2016  NATHIN BETKER 02-Aug-1928 [id number]   Spoke with patient at bedside regarding [facility] services. Patient does not want to decide on [facility] program until he can speak with his wife and daughters. Patient reports his wife and 4 daughters are all supportive, 2 daughters live close by and assist regularly. Patient given [facility] brochure and RNCM contact information and requested he discuss with family and for them to call with any questions, concerns or if they would like to participate in [facility] program. Patient very appreciative of the information.  Of note, [facility] Care Management services would not replace or interfere with any services that are arranged by inpatient case management or social work. For additional questions or referrals please contact:  Alben Spittle. Albertha Ghee, RN, BSN, [facility]  [facility] Liaison [phone]

## 2016-01-28 NOTE — Care Management Note (Signed)
Case Management Note  Patient Details  Name: [name] MRN: [id number] Date of Birth: 1927-11-02  Subjective/Objective:                  Pt is from home, lives with wife and is ind with ADL's. Pt's wife does not drive but pt does drive daily. Pt has cane, walker, wheelchair and neb machine PTA. Pt goes to [facility] wound [facility]. Pt has PCP, transportation and no difficulty affording his medications. Pt plans to return home at DC.  Action/Plan: ? Need HH at DC, ? Need home O2 assessment. Will cont to follow.   Expected Discharge Date:     01/31/2016             Expected Discharge Plan:  Home/Self Care  In-House Referral:  NA  Discharge planning Services     Post Acute Care Choice:  NA Choice offered to:  NA  DME Arranged:    DME Agency:     HH Arranged:    HH Agency:     Status of Service:  In process, will continue to follow  Medicare Important Message Given:    Date Medicare IM Given:    Medicare IM give by:    Date Additional Medicare IM Given:    Additional Medicare Important Message give by:     If discussed at Long Length of Stay Meetings, dates discussed:    Additional Comments:  Malcolm Metro, RN 01/28/2016, 11:48 AM

## 2016-01-28 NOTE — Progress Notes (Signed)
[facility]  PROGRESS NOTE  [name] CMK:[id number] DOB: 01-18-1928 DOA: 01/27/2016 PCP: [name]., MD   Brief HPI:  82 year old male with history of COPD, ( not on home O2, quit smoking 2 years ago, currently smoking the cigarettes), CAD stage III, history of systolic CHF (last echo in 2015 with EF of 40-45%), A. fib not on anticoagulation due to GI bleed, type 2 diabetes mellitus on oral hypoglycemics, cardiac pacemaker was sent from the wound clinic for shortness of breath. Patient found to be in acute hypoxic respiratory failure secondary to COPD as well as CHF exacerbation. Echocardiogram has been ordered.  Principal Problem:   COPD exacerbation (HCC) Active Problems:   TOBACCO USER   Essential hypertension   Cardiac pacemaker in situ   Acute on chronic systolic heart failure (HCC)   CKD (chronic kidney disease) stage 3, GFR 30-59 ml/min   Acute respiratory failure with hypoxia (HCC)   Diabetes mellitus with hyperglycemia, without long-term current use of insulin (HCC)   Hyperkalemia   Acute respiratory failure with hypoxemia (HCC)   Acute exacerbation of chronic obstructive pulmonary disease (COPD) (HCC)   Assessment/Plan: 1. Acute on chronic hypoxic respiratory failure-improving, multifactorial likely from CHF exacerbation as well as underlying COPD exacerbation. Continue treatment for both COPD as well as CHF exacerbation. 2. COPD exacerbation- improving, continue Solu-Medrol, Mucinex, Xopenex and ipratropium nebulizer  every 4 hours. I will add Levaquin 500 mg by mouth daily.  3.  CHF exacerbation-  echocardiogram from 2015 showed right ventricular systolic dysfunction, LV systolic dysfunction with EF 40-50%. Repeat echocardiogram has been ordered to this admission. And is currently pending. Net output -1.3 liters, since admission. Currently on Lasix 40 mg IV every 12 hours. Will change Lasix to 40 mg by mouth daily as creatinine has started to creep  up. 4. Chronic  kidney disease stage III- patient's baseline creatinine is around 1.8. Today creatinine is 1.97 offer diuresis with Lasix. Will change IV Lasix to by mouth. Follow BMP in a.m. 5. Diabetes mellitus- blood glucose is elevated due to Solu-Medrol, continue glyburide 5 mg 3 times a day, sliding scale insulin with NovoLog. We'll add Lantus 10 units subcutaneous daily for better glucose control.  6. Hypertension- blood pressure control, continue M.D., hydralazine, bisoprolol. Bisoprolol added during this hospitalization 7. Atrial fibrillation- CHADS2Vasc score is 5. Heart rate is controlled Patient not on anticoagulation due to history of GI bleed. Continue aspirin.   DVT prophylaxis: Lovenox Code Status: Full code full code Family Communication: *No family present at bedside Disposition Plan: Pending improvement in respiratory status, PT evaluation   Consultants:  None  Procedures:  None  Antibiotics:  None   Subjective: Patient seen and examined, bleeding has improved. Good diuretic response with IV Lasix.  Objective: Filed Vitals:   01/28/16 0255 01/28/16 0515 01/28/16 0837 01/28/16 0850  BP:  142/62    Pulse:  71    Temp:  98 F (36.7 C)    TempSrc:  Oral    Resp:  20    Height:      Weight:  81.7 kg (180 lb 1.9 oz)    SpO2: 93% 97% 95% 95%    Intake/Output Summary (Last 24 hours) at 01/28/16 0929 Last data filed at 01/28/16 0900  Gross per 24 hour  Intake    480 ml  Output   1550 ml  Net  -1070 ml   Filed Weights   01/27/16 1509 01/27/16 1834 01/28/16 0515  Weight: 81.194 kg (179  lb) 81.194 kg (179 lb) 81.7 kg (180 lb 1.9 oz)    Examination:  General exam: Appears calm and comfortable  Respiratory system: Decreased breath sounds bilaterally, Respiratory effort normal. Cardiovascular system: S1 & S2 heard, RRR. No JVD, murmurs, rubs, gallops or clicks. Trace edema of the lower extremities Gastrointestinal system: Abdomen is nondistended, soft  and nontender. No organomegaly or masses felt. Normal bowel sounds heard. Central nervous system: Alert and oriented. No focal neurological deficits. Extremities: Symmetric 5 x 5 power. Skin: No rashes, lesions or ulcers Psychiatry: Judgement and insight appear normal. Mood & affect appropriate.    Data Reviewed: I have personally reviewed following labs and imaging studies Basic Metabolic Panel:  Recent Labs Lab 01/27/16 1511 01/27/16 1714 01/28/16 0550  NA 138 138 136  K 6.0* 5.7* 4.9  CL 109 105 102  CO2 21*  --  22  GLUCOSE 280* 280* 384*  BUN 49* 46* 50*  CREATININE 1.81* 1.70* 1.97*  CALCIUM 8.4*  --  8.4*   Liver Function Tests:  Recent Labs Lab 01/27/16 1511  AST 28  ALT 30  ALKPHOS 59  BILITOT 0.8  PROT 6.7  ALBUMIN 3.9   No results for input(s): LIPASE, AMYLASE in the last 168 hours. No results for input(s): AMMONIA in the last 168 hours. CBC:  Recent Labs Lab 01/27/16 1511 01/27/16 1714  WBC 18.2*  --   NEUTROABS 16.8*  --   HGB 12.3* 13.6  HCT 38.0* 40.0  MCV 93.1  --   PLT 193  --      Recent Labs  01/19/16 2342 01/27/16 1511  BNP 224.0* 354.0*     CBG:  Recent Labs Lab 01/21/16 1124 01/21/16 1608 01/27/16 2103 01/28/16 0718  GLUCAP 261* 137* 328* 327*     Studies: Dg Chest Port 1 View  01/27/2016  CLINICAL DATA:  Shortness of breath for 1 year, worsening over the last week. EXAM: PORTABLE CHEST 1 VIEW COMPARISON:  01/19/2016. FINDINGS: Trachea is midline. Heart size stable. Thoracic aorta is calcified. Left subclavian pacemaker lead tip projects over the right ventricle. There may be mild volume loss at the left costophrenic angle. Lungs are otherwise clear. No pleural fluid. IMPRESSION: No acute findings. Electronically Signed   By: Leanna Battles M.D.   On: 01/27/2016 15:27    Scheduled Meds: . aspirin  81 mg Oral Daily  . bisoprolol  5 mg Oral Daily  . budesonide (PULMICORT) nebulizer solution  0.25 mg Nebulization BID   . enoxaparin (LOVENOX) injection  30 mg Subcutaneous Q24H  . ezetimibe  10 mg Oral Daily  . furosemide  40 mg Intravenous Q12H  . glyBURIDE  5 mg Oral TID WC  . guaiFENesin  1,200 mg Oral BID  . hydrALAZINE  12.5 mg Oral TID  . insulin aspart  0-15 Units Subcutaneous TID WC  . insulin aspart  0-5 Units Subcutaneous QHS  . ipratropium  0.5 mg Nebulization Q4H  . isosorbide mononitrate  30 mg Oral BID  . levalbuterol  0.63 mg Nebulization Q4H  . methylPREDNISolone (SOLU-MEDROL) injection  60 mg Intravenous Q8H  . sodium chloride flush  3 mL Intravenous Q12H  . sodium chloride flush  3 mL Intravenous Q12H  . sodium chloride flush  3 mL Intravenous Q12H   Continuous Infusions:      Time spent: 20 min    [facility]  [facility] Pager [phone]*. If 7PM-7AM, please contact night-coverage at [email or web address], Office  [phone]  password [facility] 01/28/2016,  9:29 AM  LOS: 1 day

## 2016-01-28 NOTE — Progress Notes (Signed)
Inpatient Diabetes Program Recommendations  AACE/ADA: New Consensus Statement on Inpatient Glycemic Control (2015)  Target Ranges:  Prepandial:   less than 140 mg/dL      Peak postprandial:   less than 180 mg/dL (1-2 hours)      Critically ill patients:  140 - 180 mg/dL   Review of Glycemic Control Results for [name], [name] (MRN [id number]) as of 01/28/2016 10:02  Ref. Range 01/27/2016 21:03 01/28/2016 07:18  Glucose-Capillary Latest Ref Range: 65-99 mg/dL 111 (H) 552 (H)   Diabetes history: DM Type 2 Outpatient Diabetes medications: Glyburide 5 mg tid + Glyset 100 mg tid + Metformin 1 gm bid Current orders for Inpatient glycemic control: Lantus 10 units daily + Glyburide 5 mg daily + Novolog correction scale moderate 0-15 & hs 0-5  Inpatient Diabetes Program Recommendations:  Noted pharmacy note regarding Glyburide. Please consider increase in Lantus to 16 units and discontinue Glyburide. May also consider meal coverage tid of Novolog 3 units tid.  Thank you, Billy Fischer. Braxden Lovering, RN, MSN, CDE Inpatient Glycemic Control Team Team Pager [phone] (8am-5pm) 01/28/2016 10:11 AM

## 2016-01-28 NOTE — Evaluation (Addendum)
Physical Therapy Evaluation Patient Details Name: [name] MRN: [id number] DOB: [birth year] Today's Date: 01/28/2016   History of Present Illness  82 yo M admitted with acute hypoxic respiratory failure due to COPD exacerbation.  Pt has had multiple recent hospitalizations.  Pt has been receiving follow up PT for R LE wound, and was recently evaluated at outpatient for balance deficits.  Pt has a PMH of respiratory failure, COPD, heart block, chronic renal insufficiency, DM, CAD, cardiomyopathy with EF of 40%, DJD, HTN, PVD, SA node dysfunction, and pacemaker.   Clinical Impression  Pt received in bed, and was marginally agreeable to PT evaluation.  Pt expressed that he was fatigued, and did not see the point of this, but he was willing to follow the doctor's orders.  Pt demonstrated bed mobility with Mod (I), and sit<>stand transfers with Min A.  Upon standing, pt felt the urge to have a BM, therefore assisted pt into the bathroom.  Pt demonstrated extremely poor safety awareness by not staying inside the base of the walker, and attempting to turn walker to the side and only use grab bars to hang on to once he reached the bathroom door. Pt also ran into multiple objects in the room due to attempting to move too quickly without negotiating RW before encroaching on object.  During tx pt seems to be SOB, but also demonstrates very loud mouth breathing despite instruction on PLB.  He will take maybe 1 breath with mouth closed, but then quickly returns to mouth breathing. SpO2 remained between 95-97% on 2L during tx.  Due to pt's poor safety awareness, and decreased balance, and multiple re-admissions he is recommended for HHPT vs SNF.     Follow Up Recommendations Home health PT vs SNF    Equipment Recommendations       Recommendations for Other Services       Precautions / Restrictions Precautions Precautions: Fall Restrictions Weight Bearing Restrictions: No      Mobility  Bed  Mobility Overal bed mobility: Modified Independent             General bed mobility comments: vc's for hand placement and encouraged to push himself to sit up.   Transfers Overall transfer level: Needs assistance Equipment used: Rolling walker (2 wheeled) Transfers: Sit to/from Stand Sit to Stand: Min assist         General transfer comment: during tx, pt expressed need for a BM, therefore PT assisted pt into the bathroom.  Pt requires assistance for safe transfer on/off bathroom commode. Pt required Max A for hygiene after BM, and was able to stand with Min A to wash hands at the sink.   Ambulation/Gait Ambulation/Gait assistance: Mod assist;Min assist Ambulation Distance (Feet): 20 Feet Assistive device: Rolling walker (2 wheeled) Gait Pattern/deviations: Shuffle;Trunk flexed     General Gait Details: Pt demonstrates poor safety awareness as he attempts to push RW away once at the bathroom doorway.  PT instructed on continued use of RW, but pt only holding it with 1 hand, and grasping grab bars with other.  Pt with very shuffled gait.  Pt demonstrates poor ability to negotiate RW and ran into multiple objects in the room.  Stairs            Wheelchair Mobility    Modified Rankin (Stroke Patients Only)       Balance           Standing balance support: Bilateral upper extremity supported Standing balance-Leahy Scale: Poor  Pertinent Vitals/Pain Pain Assessment: No/denies pain    Home Living Family/patient expects to be discharged to:: Private residence Living Arrangements: Spouse/significant other Available Help at Discharge: Family (Dtr nearby & assist with groceries and running errands. ) Type of Home: House Home Access: Stairs to enter Entrance Stairs-Rails: Can reach both Entrance Stairs-Number of Steps: 6 Home Layout: One level Home Equipment: Cane - single point;Walker - 2 wheels;Shower seat       Prior Function Level of Independence: Independent with assistive device(s)   Gait / Transfers Assistance Needed: Pt states that he uses the cane inside the house, and uses the RW when out in the community.   ADL's / Homemaking Assistance Needed: Pt states that he is independent with ADL's, but pt's dtr assists pt with running errands.         Hand Dominance        Extremity/Trunk Assessment   Upper Extremity Assessment: Generalized weakness           Lower Extremity Assessment: Generalized weakness         Communication   Communication: No difficulties  Cognition Arousal/Alertness: Awake/alert Behavior During Therapy: WFL for tasks assessed/performed   Area of Impairment: Safety/judgement;Problem solving         Safety/Judgement: Decreased awareness of deficits;Decreased awareness of safety   Problem Solving: Requires verbal cues;Requires tactile cues;Slow processing      General Comments      Exercises        Assessment/Plan    PT Assessment Patient needs continued PT services  PT Diagnosis Generalized weakness;Difficulty walking   PT Problem List Decreased strength;Decreased activity tolerance;Decreased balance;Decreased mobility;Decreased coordination;Decreased knowledge of use of DME;Decreased safety awareness;Decreased knowledge of precautions;Cardiopulmonary status limiting activity;Decreased skin integrity  PT Treatment Interventions DME instruction;Gait training;Functional mobility training;Therapeutic activities;Stair training;Therapeutic exercise;Balance training;Patient/family education   PT Goals (Current goals can be found in the Care Plan section) Acute Rehab PT Goals Patient Stated Goal: Pt wants to be able to get his strengh back to what he was doing before.  PT Goal Formulation: With patient Time For Goal Achievement: 02/11/16 Potential to Achieve Goals: Fair    Frequency Min 5X/week   Barriers to discharge        Co-evaluation                End of Session Equipment Utilized During Treatment: Gait belt;Oxygen Activity Tolerance: Patient tolerated treatment well Patient left: in bed;with call bell/phone within reach;with bed alarm set;with nursing/sitter in room      Functional Assessment Tool Used: clinical Judgement Functional Limitation: Mobility: Walking and moving around Mobility: Walking and Moving Around Current Status (Y3338): At least 80 percent but less than 100 percent impaired, limited or restricted Mobility: Walking and Moving Around Goal Status [phone]): At least 60 percent but less than 80 percent impaired, limited or restricted    Time: 1111-1144 PT Time Calculation (min) (ACUTE ONLY): 33 min   Charges:   PT Evaluation $PT Eval Moderate Complexity: 1 Procedure PT Treatments $Therapeutic Activity: 8-22 mins   PT G Codes:   PT G-Codes **NOT FOR INPATIENT CLASS** Functional Assessment Tool Used: clinical Judgement Functional Limitation: Mobility: Walking and moving around Mobility: Walking and Moving Around Current Status (T6606): At least 80 percent but less than 100 percent impaired, limited or restricted Mobility: Walking and Moving Around Goal Status [id number]): At least 60 percent but less than 80 percent impaired, limited or restricted    Carollee Herter, PT, DPT X: [id number]   01/28/2016,  12:34 PM

## 2016-01-28 NOTE — Clinical Social Work Note (Signed)
Clinical Social Work Assessment  Patient Details  Name: [name] MRN: [id number] Date of Birth: [birth year]  Date of referral:  01/28/16               Reason for consult:  Discharge Planning                Permission sought to share information with:    Permission granted to share information::     Name::        Agency::     Relationship::     Contact Information:     Housing/Transportation Living arrangements for the past 2 months:  Single Family Home Source of Information:  Patient Patient Interpreter Needed:  None Criminal Activity/Legal Involvement Pertinent to Current Situation/Hospitalization:  No - Comment as needed Significant Relationships:  Adult Children, Spouse Lives with:  Spouse Do you feel safe going back to the place where you live?  Yes Need for family participation in patient care:  Yes (Comment)  Care giving concerns:  Safety concerns due to balance.    Social Worker assessment / plan:  CSW met with pt at bedside. Pt alert and oriented and reports he lives with his wife. They have several daughters who live close by and check on them a few times daily. Daughters also provide meals when needed. He shared that their neighbors are also very helpful. Pt states, "We are old, but we do okay." He ambulates with a walker or a cane at baseline. PT evaluated pt today and recommend home health vs SNF. CSW discussed this with pt and he refuses to consider SNF. Pt feels that he is close to his baseline and feels comfortable returning home. He is very agreeable to home health services. CSW will sign off, but can be reconsulted if needed.   Employment status:  Retired Forensic scientist:  [employer] PT Recommendations:  [place], Home with [place] / Referral to community resources:  [place]  Patient/Family's Response to care:  Pt refuses SNF and plans to return home.   Patient/Family's Understanding of and Emotional  Response to Diagnosis, Current Treatment, and Prognosis: Pt states, "I am not going to rehab." He appears to have understanding of admission diagnosis and recommendations.  Emotional Assessment Appearance:  Appears stated age Attitude/Demeanor/Rapport:  Other (Cooperative) Affect (typically observed):  Appropriate Orientation:  Oriented to Self, Oriented to Place, Oriented to  Time, Oriented to Situation Alcohol / Substance use:  Not Applicable Psych involvement (Current and /or in the community):  No (Comment)  Discharge Needs  Concerns to be addressed:  No discharge needs identified Readmission within the last 30 days:  Yes Current discharge risk:  Physical Impairment Barriers to Discharge:  Continued Medical Work up   [employer], [employer], LCSW 01/28/2016, 2:55 PM [phone]

## 2016-01-29 ENCOUNTER — Ambulatory Visit (HOSPITAL_COMMUNITY): Payer: Medicare Other | Admitting: Physical Therapy

## 2016-01-29 ENCOUNTER — Telehealth (HOSPITAL_COMMUNITY): Payer: Self-pay

## 2016-01-29 DIAGNOSIS — J9601 Acute respiratory failure with hypoxia: Secondary | ICD-10-CM

## 2016-01-29 DIAGNOSIS — Z950 Presence of cardiac pacemaker: Secondary | ICD-10-CM

## 2016-01-29 DIAGNOSIS — J441 Chronic obstructive pulmonary disease with (acute) exacerbation: Secondary | ICD-10-CM

## 2016-01-29 LAB — BASIC METABOLIC PANEL
ANION GAP: 9 (ref 5–15)
BUN: 57 mg/dL — ABNORMAL HIGH (ref 6–20)
CO2: 27 mmol/L (ref 22–32)
Calcium: 8.4 mg/dL — ABNORMAL LOW (ref 8.9–10.3)
Chloride: 99 mmol/L — ABNORMAL LOW (ref 101–111)
Creatinine, Ser: 2.01 mg/dL — ABNORMAL HIGH (ref 0.61–1.24)
GFR calc non Af Amer: 29 mL/min — ABNORMAL LOW (ref >60)
GFR, EST AFRICAN AMERICAN: 33 mL/min — AB (ref >60)
GLUCOSE: 138 mg/dL — AB (ref 65–99)
POTASSIUM: 4.8 mmol/L (ref 3.5–5.1)
Sodium: 135 mmol/L (ref 135–145)

## 2016-01-29 LAB — GLUCOSE, CAPILLARY
GLUCOSE-CAPILLARY: 169 mg/dL — AB (ref 65–99)
GLUCOSE-CAPILLARY: 262 mg/dL — AB (ref 65–99)
GLUCOSE-CAPILLARY: 332 mg/dL — AB (ref 65–99)
GLUCOSE-CAPILLARY: 379 mg/dL — AB (ref 65–99)

## 2016-01-29 LAB — CBC
HEMATOCRIT: 37.7 % — AB (ref 39.0–52.0)
HEMOGLOBIN: 12.5 g/dL — AB (ref 13.0–17.0)
MCH: 30.4 pg (ref 26.0–34.0)
MCHC: 33.2 g/dL (ref 30.0–36.0)
MCV: 91.7 fL (ref 78.0–100.0)
PLATELETS: 183 10*3/uL (ref 150–400)
RBC: 4.11 MIL/uL — AB (ref 4.22–5.81)
RDW: 15.1 % (ref 11.5–15.5)
WBC: 19.4 10*3/uL — AB (ref 4.0–10.5)

## 2016-01-29 LAB — BRAIN NATRIURETIC PEPTIDE: B Natriuretic Peptide: 246 pg/mL — ABNORMAL HIGH (ref 0.0–100.0)

## 2016-01-29 NOTE — Care Management Note (Signed)
Case Management Note  Patient Details  Name: [name] MRN: [id number] Date of Birth: 04-20-1928  Expected Discharge Date:       01/31/2016           Expected Discharge Plan:  Home w Home Health Services  In-House Referral:  NA  Discharge planning Services  NA  Post Acute Care Choice:  Durable Medical Equipment, Home Health Choice offered to:  Patient  DME Arranged:  Oxygen DME Agency:  [facility]  HH Arranged:  RN, PT [facility] Agency:  [facility]  Status of Service:  Completed, signed off  Medicare Important Message Given:  Yes Date Medicare IM Given:    Medicare IM give by:    Date Additional Medicare IM Given:    Additional Medicare Important Message give by:     If discussed at Long Length of Stay Meetings, dates discussed:    Additional Comments: Anticipate DC home over weekend. Pt will need HH nursing and PT. Pt has chosen [facility] from [facility] agency list. Pt understands HH will have 48 hours to initiate services. Alroy Bailiff, of [facility], made aware of referral and will obtain pt info from chart. PT will need home O2 assessment prior to DC. Pt would like O2 to come from [facility] also if he needs it. Weekend staff will contact [facility] to notify them of DC over weekend and provide DME orders.  Malcolm Metro, RN 01/29/2016, 10:09 AM

## 2016-01-29 NOTE — Progress Notes (Signed)
Physical Therapy Treatment Patient Details Name: [name] MRN: [id number] DOB: Feb 27, 1928 Today's Date: 01/29/2016    History of Present Illness 82 yo M admitted with acute hypoxic respiratory failure due to COPD exacerbation.  Pt has had multiple recent hospitalizations.  Pt has been receiving follow up PT for R LE wound, and was recently evaluated at outpatient for balance deficits.  Pt has a PMH of respiratory failure, COPD, heart block, chronic renal insufficiency, DM, CAD, cardiomyopathy with EF of 40%, DJD, HTN, PVD, SA node dysfunction, and pacemaker.     PT Comments    Pt semirecumbent in bed with daughter and son in law in room, willing to participate with therapist today.  Pt independent with supine to sit, very fast with bed mobility today.  Min guard with transfer sit to stand with cueing for hand placement for assistance to standing and safety.  Increased distance with gait training today though poor safety awareness with gait mechanics.  Pt demonstrates poor safety awarness with gait mechanics with verbal cueing and manual assistance to stay within the walker.  Pt with shuffled with max cueing to increase stride length.  Poor abilty to negoiate RW with turns, running into wall increased risk of fall without assistance.  Upon return to room pt left in bed due to time of treatment and request.  Pt c/o SOB requiring diaphagamatic breathing instructions to improve breathing.  O2 sat following gait range for 94-97%.  No reports of pain throgh session.  Call bell within reach and daughter/in law in room.    Follow Up Recommendations        Equipment Recommendations       Recommendations for Other Services       Precautions / Restrictions Precautions Precautions: Fall Restrictions Weight Bearing Restrictions: No    Mobility  Bed Mobility Overal bed mobility: Modified Independent             General bed mobility comments: vc's for hand placement and encouraged to push  himself to sit up.   Transfers Overall transfer level: Modified independent Equipment used: Rolling walker (2 wheeled) Transfers: Sit to/from Stand Sit to Stand: Min guard         General transfer comment: cueing for handplacement to assist with standings and safety  Ambulation/Gait Ambulation/Gait assistance: Mod assist;Min assist Ambulation Distance (Feet): 88 Feet Assistive device: Rolling walker (2 wheeled) Gait Pattern/deviations: Shuffle;Trunk flexed     General Gait Details: Pt demonstrates poor safety awarness with mechanics with, contact cueing and manual assistance to step into the walker.  Pt with shuffled with max cueing to increase stride length.  Poor abilty to negoiate RW with turns, running into wall.   Stairs            Wheelchair Mobility    Modified Rankin (Stroke Patients Only)       Balance                                    Cognition Arousal/Alertness: Awake/alert Behavior During Therapy: WFL for tasks assessed/performed Overall Cognitive Status: Within Functional Limits for tasks assessed Area of Impairment: Safety/judgement;Problem solving         Safety/Judgement: Decreased awareness of deficits;Decreased awareness of safety   Problem Solving: Requires verbal cues;Requires tactile cues;Slow processing General Comments: Pt pushes RW too far infront, multiple cueing to stand within walker for safety and endurance.    Exercises  General Comments        Pertinent Vitals/Pain Pain Assessment: No/denies pain    Home Living                      Prior Function            PT Goals (current goals can now be found in the care plan section) Acute Rehab PT Goals Patient Stated Goal: Pt wants to be able to get his strengh back to what he was doing before.  Progress towards PT goals: Progressing toward goals    Frequency       PT Plan Current plan remains appropriate    Co-evaluation              End of Session Equipment Utilized During Treatment: Gait belt;Oxygen Activity Tolerance: Patient tolerated treatment well Patient left: in bed;with call bell/phone within reach;with bed alarm set;with family/visitor present     Time: 2683-4196 PT Time Calculation (min) (ACUTE ONLY): 24 min  Charges:  $Gait Training: 8-22 mins $Therapeutic Activity: 8-22 mins                    G Codes:     Becky Sax, LPTA; CBIS [phone]  [name] 01/29/2016, 6:27 PM

## 2016-01-29 NOTE — Care Management Important Message (Signed)
Important Message  Patient Details  Name: [name] MRN: [id number] Date of Birth: 09/05/1928   Medicare Important Message Given:  Yes    Malcolm Metro, RN 01/29/2016, 10:06 AM

## 2016-01-29 NOTE — Telephone Encounter (Signed)
01/29/16 patient is in the hospital

## 2016-01-29 NOTE — Progress Notes (Signed)
Inpatient Diabetes Program Recommendations  AACE/ADA: New Consensus Statement on Inpatient Glycemic Control (2015)  Target Ranges:  Prepandial:   less than 140 mg/dL      Peak postprandial:   less than 180 mg/dL (1-2 hours)      Critically ill patients:  140 - 180 mg/dL  Results for ETHAN, MCKINNEY (MRN [id number]) as of 01/29/2016 11:06  Ref. Range 01/28/2016 07:18 01/28/2016 11:07 01/28/2016 16:45 01/28/2016 22:07 01/29/2016 08:01  Glucose-Capillary Latest Ref Range: 65-99 mg/dL 009 (H) 381 (H) 829 (H) 175 (H) 169 (H)   Review of Glycemic Control  Current orders for Inpatient glycemic control: Lantus 10 units QHS, Novolog 0-15 units TID with meals, Novolog 0-5 units QHS, Glyburide 5 mg TID  Inpatient Diabetes Program Recommendations: Insulin - Basal: Fasting glucose 169 mg/dl this morning. Do not reccommend any changes with Lantus dose at this time. Insulin - Meal Coverage: If steroids are continued as ordered,please consider ordering Novolog 5 units TID with meals for meal coverage (in addition to Novolog correction scale). Oral Agents: Please see Pharmacy note on MD sticky note regarding Glyburide and consider discontinuing Glyburide.  Thanks, Orlando Penner, RN, MSN, CDE Diabetes Coordinator Inpatient Diabetes Program [phone] (Team Pager from 8am to 5pm) [phone] ([facility] office) [phone] [facility] office) [phone] [facility] office)

## 2016-01-29 NOTE — Progress Notes (Signed)
[facility]  PROGRESS NOTE  [name] NGE:[id number] DOB: 08-06-1928 DOA: 01/27/2016 PCP: [name]., MD   Brief HPI:  82 year old male with history of COPD, ( not on home O2, quit smoking 2 years ago, currently smoking the cigarettes), CAD stage III, history of systolic CHF (last echo in 2015 with EF of 40-45%), A. fib not on anticoagulation due to GI bleed, type 2 diabetes mellitus on oral hypoglycemics, cardiac pacemaker was sent from the wound clinic for shortness of breath. Patient found to be in acute hypoxic respiratory failure secondary to COPD as well as CHF exacerbation. Echocardiogram has been ordered.  Principal Problem:   COPD exacerbation (HCC) Active Problems:   TOBACCO USER   Essential hypertension   Cardiac pacemaker in situ   Acute on chronic systolic heart failure (HCC)   CKD (chronic kidney disease) stage 3, GFR 30-59 ml/min   Acute respiratory failure with hypoxia (HCC)   Diabetes mellitus with hyperglycemia, without long-term current use of insulin (HCC)   Hyperkalemia   Acute respiratory failure with hypoxemia (HCC)   Acute exacerbation of chronic obstructive pulmonary disease (COPD) (HCC)   Assessment/Plan: 1. Acute on chronic hypoxic respiratory failure-improving, multifactorial likely from CHF exacerbation as well as underlying COPD exacerbation. Continue treatment for both COPD as well as CHF exacerbation. 2. COPD exacerbation- improving, continue Solu-Medrol, Mucinex, Xopenex and ipratropium nebulizer  every 4 hours. Patient continued on Levaquin 500 mg by mouth daily.  3.  CHF exacerbation-  echocardiogram from 2015 showed right ventricular systolic dysfunction, LV systolic dysfunction with EF 40-50%. Repeat echocardiogram has been ordered to this admission. And is currently pending. Net output -1.3 liters, since admission. Currently on Lasix to 40 mg by mouth daily. 4. Chronic  kidney disease stage III- Cr remains stable. Cont to follow  BMET. 5. Diabetes mellitus- blood glucose is elevated due to Solu-Medrol, continue glyburide 5 mg 3 times a day, sliding scale insulin with NovoLog. Cont Lantus 10 units subcutaneous daily for better glucose control.  6. Hypertension- blood pressure control, continue M.D., hydralazine, bisoprolol. Bisoprolol added during this hospitalization 7. Atrial fibrillation- CHADS2Vasc score is 5. Heart rate is controlled Patient not on anticoagulation due to history of GI bleed. Continue aspirin.   DVT prophylaxis: Lovenox Code Status: Full code full code Family Communication: *No family present at bedside, pt in room Disposition Plan: Pending improvement in respiratory status,    Consultants:  None  Procedures:  None  Antibiotics:  None   Subjective: Reports feeling somewhat better today. In good spirits  Objective: Filed Vitals:   01/29/16 0700 01/29/16 0737 01/29/16 0747 01/29/16 1052  BP:      Pulse:      Temp:      TempSrc:      Resp:      Height:  (1.778 m)     Weight: 80.74 kg (178 lb)     SpO2:  98% 98% 97%    Intake/Output Summary (Last 24 hours) at 01/29/16 1353 Last data filed at 01/29/16 1226  Gross per 24 hour  Intake    720 ml  Output   1200 ml  Net   -480 ml   Filed Weights   01/27/16 1834 01/28/16 0515 01/29/16 0700  Weight: 81.194 kg (179 lb) 81.7 kg (180 lb 1.9 oz) 80.74 kg (178 lb)    Examination:  General exam: Appears calm and comfortable  Respiratory system: Decreased breath sounds bilaterally, Respiratory effort normal, B wheezing throughout Cardiovascular system: S1 &  S2 heard, RRR. Gastrointestinal system: Abdomen is nondistended, soft and nontender. No organomegaly or masses felt. Normal bowel sounds heard. Central nervous system: Alert and oriented. No focal neurological deficits. Extremities: Symmetric 5 x 5 power. Skin: No rashes, lesions or ulcers Psychiatry: Judgement and insight appear normal. Mood & affect appropriate.     Data Reviewed: I have personally reviewed following labs and imaging studies Basic Metabolic Panel:  Recent Labs Lab 01/27/16 1511 01/27/16 1714 01/28/16 0550 01/29/16 0451  NA 138 138 136 135  K 6.0* 5.7* 4.9 4.8  CL 109 105 102 99*  CO2 21*  --  22 27  GLUCOSE 280* 280* 384* 138*  BUN 49* 46* 50* 57*  CREATININE 1.81* 1.70* 1.97* 2.01*  CALCIUM 8.4*  --  8.4* 8.4*   Liver Function Tests:  Recent Labs Lab 01/27/16 1511  AST 28  ALT 30  ALKPHOS 59  BILITOT 0.8  PROT 6.7  ALBUMIN 3.9   No results for input(s): LIPASE, AMYLASE in the last 168 hours. No results for input(s): AMMONIA in the last 168 hours. CBC:  Recent Labs Lab 01/27/16 1511 01/27/16 1714 01/29/16 0451  WBC 18.2*  --  19.4*  NEUTROABS 16.8*  --   --   HGB 12.3* 13.6 12.5*  HCT 38.0* 40.0 37.7*  MCV 93.1  --  91.7  PLT 193  --  183     Recent Labs  01/19/16 2342 01/27/16 1511 01/29/16 0452  BNP 224.0* 354.0* 246.0*     CBG:  Recent Labs Lab 01/28/16 1107 01/28/16 1645 01/28/16 2207 01/29/16 0801 01/29/16 1149  GLUCAP 338* 245* 175* 169* 262*     Studies: Dg Chest Port 1 View  01/27/2016  CLINICAL DATA:  Shortness of breath for 1 year, worsening over the last week. EXAM: PORTABLE CHEST 1 VIEW COMPARISON:  01/19/2016. FINDINGS: Trachea is midline. Heart size stable. Thoracic aorta is calcified. Left subclavian pacemaker lead tip projects over the right ventricle. There may be mild volume loss at the left costophrenic angle. Lungs are otherwise clear. No pleural fluid. IMPRESSION: No acute findings. Electronically Signed   By: Leanna Battles M.D.   On: 01/27/2016 15:27    Scheduled Meds: . aspirin  81 mg Oral Daily  . bisoprolol  5 mg Oral Daily  . budesonide (PULMICORT) nebulizer solution  0.25 mg Nebulization BID  . enoxaparin (LOVENOX) injection  30 mg Subcutaneous Q24H  . ezetimibe  10 mg Oral Daily  . furosemide  40 mg Oral Daily  . glyBURIDE  5 mg Oral TID WC  .  guaiFENesin  1,200 mg Oral BID  . hydrALAZINE  12.5 mg Oral TID  . insulin aspart  0-15 Units Subcutaneous TID WC  . insulin aspart  0-5 Units Subcutaneous QHS  . insulin glargine  10 Units Subcutaneous QHS  . ipratropium  0.5 mg Nebulization QID  . isosorbide mononitrate  30 mg Oral BID  . levalbuterol  0.63 mg Nebulization QID  . [START ON 01/30/2016] levofloxacin  500 mg Oral Q48H  . methylPREDNISolone (SOLU-MEDROL) injection  60 mg Intravenous Q8H  . sodium chloride flush  3 mL Intravenous Q12H  . sodium chloride flush  3 mL Intravenous Q12H  . sodium chloride flush  3 mL Intravenous Q12H   Continuous Infusions:   Time spent: 20 min  CHIU, STEPHEN K  [facility] Pager [phone]*. If 7PM-7AM, please contact night-coverage at [email or web address],  password [facility] 01/29/2016, 1:53 PM  LOS: 2 days

## 2016-01-30 DIAGNOSIS — I5023 Acute on chronic systolic (congestive) heart failure: Secondary | ICD-10-CM

## 2016-01-30 DIAGNOSIS — I10 Essential (primary) hypertension: Secondary | ICD-10-CM

## 2016-01-30 LAB — BASIC METABOLIC PANEL
Anion gap: 9 (ref 5–15)
BUN: 71 mg/dL — AB (ref 6–20)
CHLORIDE: 100 mmol/L — AB (ref 101–111)
CO2: 26 mmol/L (ref 22–32)
CREATININE: 2.09 mg/dL — AB (ref 0.61–1.24)
Calcium: 8.5 mg/dL — ABNORMAL LOW (ref 8.9–10.3)
GFR calc Af Amer: 32 mL/min — ABNORMAL LOW (ref >60)
GFR calc non Af Amer: 27 mL/min — ABNORMAL LOW (ref >60)
GLUCOSE: 179 mg/dL — AB (ref 65–99)
POTASSIUM: 4.6 mmol/L (ref 3.5–5.1)
SODIUM: 135 mmol/L (ref 135–145)

## 2016-01-30 LAB — GLUCOSE, CAPILLARY
GLUCOSE-CAPILLARY: 155 mg/dL — AB (ref 65–99)
Glucose-Capillary: 317 mg/dL — ABNORMAL HIGH (ref 65–99)
Glucose-Capillary: 334 mg/dL — ABNORMAL HIGH (ref 65–99)
Glucose-Capillary: 283 mg/dL — ABNORMAL HIGH (ref 65–99)

## 2016-01-30 MED ORDER — LEVALBUTEROL HCL 0.63 MG/3ML IN NEBU
0.6300 mg | INHALATION_SOLUTION | Freq: Three times a day (TID) | RESPIRATORY_TRACT | Status: DC
  Administered 2016-01-30 – 2016-01-31 (×3): 0.63 mg via RESPIRATORY_TRACT
  Filled 2016-01-30 (×3): qty 3

## 2016-01-30 MED ORDER — METHYLPREDNISOLONE SODIUM SUCC 125 MG IJ SOLR
60.0000 mg | Freq: Two times a day (BID) | INTRAMUSCULAR | Status: DC
  Administered 2016-01-30 – 2016-01-31 (×2): 60 mg via INTRAVENOUS
  Filled 2016-01-30 (×2): qty 2

## 2016-01-30 MED ORDER — IPRATROPIUM BROMIDE 0.02 % IN SOLN
0.5000 mg | Freq: Three times a day (TID) | RESPIRATORY_TRACT | Status: DC
  Administered 2016-01-30 – 2016-01-31 (×3): 0.5 mg via RESPIRATORY_TRACT
  Filled 2016-01-30 (×3): qty 2.5

## 2016-01-30 NOTE — Progress Notes (Signed)
[facility]  PROGRESS NOTE  DRAPER VALLIS BEM:[id number] DOB: 1928/06/18 DOA: 01/27/2016 PCP: [name]., MD   Brief HPI:  82 year old male with history of COPD, ( not on home O2, quit smoking 2 years ago, currently smoking the cigarettes), CAD stage III, history of systolic CHF (last echo in 2015 with EF of 40-45%), A. fib not on anticoagulation due to GI bleed, type 2 diabetes mellitus on oral hypoglycemics, cardiac pacemaker was sent from the wound clinic for shortness of breath. Patient found to be in acute hypoxic respiratory failure secondary to COPD as well as CHF exacerbation. Echocardiogram has been ordered.  Principal Problem:   COPD exacerbation (HCC) Active Problems:   TOBACCO USER   Essential hypertension   Cardiac pacemaker in situ   Acute on chronic systolic heart failure (HCC)   CKD (chronic kidney disease) stage 3, GFR 30-59 ml/min   Acute respiratory failure with hypoxia (HCC)   Diabetes mellitus with hyperglycemia, without long-term current use of insulin (HCC)   Hyperkalemia   Acute respiratory failure with hypoxemia (HCC)   Acute exacerbation of chronic obstructive pulmonary disease (COPD) (HCC)   Assessment/Plan: 1. Acute on chronic hypoxic respiratory failure-improving, multifactorial likely from CHF exacerbation as well as underlying COPD exacerbation. Wean solumedrol to BID dosing today, then qday dosing tomorrow. On PO diuretic 2. COPD exacerbation- improving, continue Solu-Medrol, Mucinex, Xopenex and ipratropium nebulizer  every 4 hours. Patient continued on Levaquin 500 mg by mouth daily.  3.  CHF exacerbation-  echocardiogram from 2015 showed right ventricular systolic dysfunction, LV systolic dysfunction with EF 40-50%. Repeat echocardiogram has been ordered to this admission with EF 55-60%, unable to determine diastolic function. Net output -2.9 liters, since admission. Currently on Lasix to 40 mg by mouth daily. 4. Chronic  kidney disease stage  III- Cr remains stable. Cont to follow BMET. 5. Diabetes mellitus- blood glucose is elevated due to Solu-Medrol, continue glyburide 5 mg 3 times a day, sliding scale insulin with NovoLog. Cont Lantus 10 units subcutaneous daily for better glucose control.  6. Hypertension- blood pressure control, continue M.D., hydralazine, bisoprolol. Bisoprolol added during this hospitalization 7. Atrial fibrillation- CHADS2Vasc score is 5. Heart rate is controlled Patient not on anticoagulation due to history of GI bleed. Continue aspirin.   DVT prophylaxis: Lovenox Code Status: Full code full code Family Communication: *No family present at bedside, pt in room Disposition Plan: Pending improvement in respiratory status,    Consultants:  None  Procedures:  None  Antibiotics:  Levaquin 4/27>>>  Subjective: Eager to go home. States he is breathing better  Objective: Filed Vitals:   01/30/16 0742 01/30/16 0755 01/30/16 1424 01/30/16 1518  BP:    119/39  Pulse:    55  Temp:    97.8 F (36.6 C)  TempSrc:    Oral  Resp:    18  Height:      Weight:      SpO2: 95% 96% 93% 95%    Intake/Output Summary (Last 24 hours) at 01/30/16 1541 Last data filed at 01/30/16 0615  Gross per 24 hour  Intake    240 ml  Output   1150 ml  Net   -910 ml   Filed Weights   01/28/16 0515 01/29/16 0700 01/30/16 0712  Weight: 81.7 kg (180 lb 1.9 oz) 80.74 kg (178 lb) 79.924 kg (176 lb 3.2 oz)    Examination:  General exam: Appears calm and comfortable  Respiratory system: Decreased breath sounds bilaterally, Respiratory effort normal,  trace B wheezing throughout Cardiovascular system: S1 & S2 heard, RRR. Gastrointestinal system: Abdomen is nondistended, soft and nontender. No organomegaly or masses felt. Normal bowel sounds heard. Central nervous system: Alert and oriented. No focal neurological deficits. Extremities: Symmetric 5 x 5 power. Skin: No rashes, lesions or ulcers Psychiatry: Judgement and  insight appear normal. Mood & affect appropriate.    Data Reviewed: I have personally reviewed following labs and imaging studies Basic Metabolic Panel:  Recent Labs Lab 01/27/16 1511 01/27/16 1714 01/28/16 0550 01/29/16 0451 01/30/16 0548  NA 138 138 136 135 135  K 6.0* 5.7* 4.9 4.8 4.6  CL 109 105 102 99* 100*  CO2 21*  --  GLUCOSE 280* 280* 384* 138* 179*  BUN 49* 46* 50* 57* 71*  CREATININE 1.81* 1.70* 1.97* 2.01* 2.09*  CALCIUM 8.4*  --  8.4* 8.4* 8.5*   Liver Function Tests:  Recent Labs Lab 01/27/16 1511  AST 28  ALT 30  ALKPHOS 59  BILITOT 0.8  PROT 6.7  ALBUMIN 3.9   No results for input(s): LIPASE, AMYLASE in the last 168 hours. No results for input(s): AMMONIA in the last 168 hours. CBC:  Recent Labs Lab 01/27/16 1511 01/27/16 1714 01/29/16 0451  WBC 18.2*  --  19.4*  NEUTROABS 16.8*  --   --   HGB 12.3* 13.6 12.5*  HCT 38.0* 40.0 37.7*  MCV 93.1  --  91.7  PLT 193  --  183     Recent Labs  01/19/16 2342 01/27/16 1511 01/29/16 0452  BNP 224.0* 354.0* 246.0*     CBG:  Recent Labs Lab 01/29/16 1149 01/29/16 1627 01/29/16 2037 01/30/16 0804 01/30/16 1153  GLUCAP 262* 332* 379* 155* 317*     Studies: No results found.  Scheduled Meds: . aspirin  81 mg Oral Daily  . bisoprolol  5 mg Oral Daily  . budesonide (PULMICORT) nebulizer solution  0.25 mg Nebulization BID  . enoxaparin (LOVENOX) injection  30 mg Subcutaneous Q24H  . ezetimibe  10 mg Oral Daily  . furosemide  40 mg Oral Daily  . glyBURIDE  5 mg Oral TID WC  . guaiFENesin  1,200 mg Oral BID  . hydrALAZINE  12.5 mg Oral TID  . insulin aspart  0-15 Units Subcutaneous TID WC  . insulin aspart  0-5 Units Subcutaneous QHS  . insulin glargine  10 Units Subcutaneous QHS  . ipratropium  0.5 mg Nebulization TID  . isosorbide mononitrate  30 mg Oral BID  . levalbuterol  0.63 mg Nebulization TID  . levofloxacin  500 mg Oral Q48H  . methylPREDNISolone  (SOLU-MEDROL) injection  60 mg Intravenous Q12H  . sodium chloride flush  3 mL Intravenous Q12H  . sodium chloride flush  3 mL Intravenous Q12H  . sodium chloride flush  3 mL Intravenous Q12H   Continuous Infusions:    Tharon Kitch K  [facility] Pager [phone]*. If 7PM-7AM, please contact night-coverage at [email or web address],  password [facility] 01/30/2016, 3:41 PM  LOS: 3 days

## 2016-01-31 DIAGNOSIS — N183 Chronic kidney disease, stage 3 (moderate): Secondary | ICD-10-CM

## 2016-01-31 LAB — BASIC METABOLIC PANEL
Anion gap: 10 (ref 5–15)
BUN: 84 mg/dL — ABNORMAL HIGH (ref 6–20)
CALCIUM: 8.6 mg/dL — AB (ref 8.9–10.3)
CHLORIDE: 99 mmol/L — AB (ref 101–111)
CO2: 27 mmol/L (ref 22–32)
CREATININE: 2.15 mg/dL — AB (ref 0.61–1.24)
GFR calc non Af Amer: 26 mL/min — ABNORMAL LOW (ref >60)
GFR, EST AFRICAN AMERICAN: 31 mL/min — AB (ref >60)
Glucose, Bld: 91 mg/dL (ref 65–99)
Potassium: 4.3 mmol/L (ref 3.5–5.1)
SODIUM: 136 mmol/L (ref 135–145)

## 2016-01-31 LAB — GLUCOSE, CAPILLARY
GLUCOSE-CAPILLARY: 68 mg/dL (ref 65–99)
Glucose-Capillary: 219 mg/dL — ABNORMAL HIGH (ref 65–99)

## 2016-01-31 MED ORDER — BISOPROLOL FUMARATE 5 MG PO TABS: 5.0000 mg | ORAL_TABLET | Freq: Every day | ORAL | Status: DC

## 2016-01-31 MED ORDER — PREDNISONE 5 MG PO TABS: 5.0000 mg | ORAL_TABLET | Freq: Every day | ORAL | Status: AC

## 2016-01-31 MED ORDER — LEVOFLOXACIN 500 MG PO TABS: 500.0000 mg | ORAL_TABLET | ORAL | Status: DC

## 2016-01-31 NOTE — Discharge Summary (Signed)
Physician Discharge Summary  [name]:[id number] DOB: 1928/08/23 DOA: 01/27/2016  PCP: [name]., MD  Admit date: 01/27/2016 Discharge date: 01/31/2016  Time spent: 2 minutes  Recommendations for Outpatient Follow-up:  1. Follow up with PCP in 2-3 weeks   Discharge Diagnoses:  Principal Problem:   COPD exacerbation (HCC) Active Problems:   TOBACCO USER   Essential hypertension   Cardiac pacemaker in situ   Acute on chronic systolic heart failure (HCC)   CKD (chronic kidney disease) stage 3, GFR 30-59 ml/min   Acute respiratory failure with hypoxia (HCC)   Diabetes mellitus with hyperglycemia, without long-term current use of insulin (HCC)   Hyperkalemia   Acute respiratory failure with hypoxemia (HCC)   Acute exacerbation of chronic obstructive pulmonary disease (COPD) (HCC)   Discharge Condition: Improved  Diet recommendation: Heart healthy, diabetic  Filed Weights   01/29/16 0700 01/30/16 0712 01/31/16 0504  Weight: 80.74 kg (178 lb) 79.924 kg (176 lb 3.2 oz) 78.744 kg (173 lb 9.6 oz)    History of present illness:  Please review dictated H and P from 4/26 for details. Briefly, 82 year old male with history of COPD, ( not on home O2, quit smoking 2 years ago, currently smoking the cigarettes), CAD stage III, history of systolic CHF (last echo in 2015 with EF of 40-45%), A. fib not on anticoagulation due to GI bleed, type 2 diabetes mellitus on oral hypoglycemics, cardiac pacemaker was sent from the wound clinic for shortness of breath. Patient found to be in acute hypoxic respiratory failure secondary to COPD as well as CHF exacerbation.   Hospital Course:  1. Acute on chronic hypoxic respiratory Improved to baseline by day of discharge, multifactorial likely from CHF exacerbation as well as underlying COPD exacerbation. Steroids were successfully weaned his hospital admission. Patient to prednisone wean on discharge. Patient has residual and expiratory  wheezing on exam by day of discharge however, patient is very adamant that this is his baseline and patient insists on going home today, declines any further inpatient workup 2. COPD exacerbation- improved with Solu-Medrol, Mucinex, Xopenex and ipratropium nebulizerthis admission. Patient was also continued on Levaquin 500 mg by mouth daily, to complete on discharge  3. CHF exacerbation- echocardiogram from 2015 showed right ventricular systolic dysfunction, LV systolic dysfunction with EF 40-50%. Repeat echocardiogram has been ordered to this admission with EF 55-60%, unable to determine diastolic function. Net output - 4.9 L, since admission. Patient has been continued on Lasix to 40 mg by mouth daily. 4. Chronic kidney disease stage III- Cr remains stable. Cont to follow BMET. 5. Diabetes mellitus- patient continued on glyburide 5 mg 3 times a day, sliding scale insulin with NovoLog while admitted. Cont Lantus 10 units subcutaneous daily for better glucose control.  6. Hypertension- blood pressure remains stable this hospital admission 7. Atrial fibrillation- CHADS2Vasc score is 5. Heart rate is controlled Patient not on anticoagulation due to history of significant GI bleed. Continued aspirin.   Discharge Exam: Filed Vitals:   01/30/16 2134 01/31/16 0504 01/31/16 0756 01/31/16 0805  BP: 149/62 139/69    Pulse: 70 65    Temp: 98.5 F (36.9 C) 98.4 F (36.9 C)    TempSrc: Oral Oral    Resp: 20 20    Height:      Weight:  78.744 kg (173 lb 9.6 oz)    SpO2: 99% 99% 98% 99%    General: Awake, in no acute distress Cardiovascular: Regular, S1-S2 Respiratory: Normal respiratory effort, end expiratory  wheezing auscultated throughout  Discharge Instructions     Medication List    TAKE these medications        aspirin 81 MG tablet  Take 81 mg by mouth daily.     bisoprolol 5 MG tablet  Commonly known as:  ZEBETA  Take 1 tablet (5 mg total) by mouth daily.     ezetimibe 10 MG  tablet  Commonly known as:  ZETIA  Take 10 mg by mouth daily.     furosemide 40 MG tablet  Commonly known as:  LASIX  Take 40 mg by mouth daily.     glyBURIDE 5 MG tablet  Commonly known as:  DIABETA  Take 5 mg by mouth 3 (three) times daily.     GLYSET 100 MG tablet  Generic drug:  miglitol  Take 1 tablet by mouth 3 (three) times daily with meals.     guaiFENesin 600 MG 12 hr tablet  Commonly known as:  MUCINEX  Take 1 tablet (600 mg total) by mouth 2 (two) times daily.     hydrALAZINE 25 MG tablet  Commonly known as:  APRESOLINE  Take 12.5 mg by mouth 3 (three) times daily.     ipratropium-albuterol 0.5-2.5 (3) MG/3ML Soln  Commonly known as:  DUONEB  Take 3 mLs by nebulization every 6 (six) hours as needed.     isosorbide mononitrate 30 MG 24 hr tablet  Commonly known as:  IMDUR  Take 30 mg by mouth 2 (two) times daily.     levalbuterol 45 MCG/ACT inhaler  Commonly known as:  XOPENEX HFA  Inhale 1 puff into the lungs every 6 (six) hours as needed for wheezing.     levofloxacin 500 MG tablet  Commonly known as:  LEVAQUIN  Take 1 tablet (500 mg total) by mouth every other day.     metFORMIN 500 MG tablet  Commonly known as:  GLUCOPHAGE  Take 1,000 mg by mouth 2 (two) times daily with a meal.     potassium chloride SA 20 MEQ tablet  Commonly known as:  K-DUR,KLOR-CON  Take 20 mEq by mouth daily.     predniSONE 5 MG tablet  Commonly known as:  DELTASONE  Take 1 tablet (5 mg total) by mouth daily with breakfast.       Allergies  Allergen Reactions  . Albuterol     REACTION: agitation  . Statins   . Sulfonamide Derivatives Other (See Comments)    unknown   Follow-up Information    Follow up with [facility].   Contact information:   [address] [place] [address] [phone]        The results of significant diagnostics from this hospitalization (including imaging, microbiology, ancillary and laboratory) are listed  below for reference.    Significant Diagnostic Studies: Dg Chest Port 1 View  01/27/2016  CLINICAL DATA:  Shortness of breath for 1 year, worsening over the last week. EXAM: PORTABLE CHEST 1 VIEW COMPARISON:  01/19/2016. FINDINGS: Trachea is midline. Heart size stable. Thoracic aorta is calcified. Left subclavian pacemaker lead tip projects over the right ventricle. There may be mild volume loss at the left costophrenic angle. Lungs are otherwise clear. No pleural fluid. IMPRESSION: No acute findings. Electronically Signed   By: Leanna Battles M.D.   On: 01/27/2016 15:27   Dg Chest Portable 1 View  01/20/2016  CLINICAL DATA:  Shortness of breath and wheezing for 3 days. Bilateral leg swelling. Productive cough. EXAM:  PORTABLE CHEST 1 VIEW COMPARISON:  04/18/2014 FINDINGS: Cardiac pacemaker. Mild cardiac enlargement without significant pulmonary vascular congestion. No focal airspace disease or consolidation. No blunting of costophrenic angles. No pneumothorax. Calcified and tortuous aorta. Degenerative changes in the shoulders. IMPRESSION: Mild cardiac enlargement.  No evidence of active pulmonary disease. Electronically Signed   By: Burman Nieves M.D.   On: 01/20/2016 00:09    Microbiology: No results found for this or any previous visit (from the past 240 hour(s)).   Labs: Basic Metabolic Panel:  Recent Labs Lab 01/27/16 1511 01/27/16 1714 01/28/16 0550 01/29/16 0451 01/30/16 0548 01/31/16 0503  NA 138 138 136 135 135 136  K 6.0* 5.7* 4.9 4.8 4.6 4.3  CL 109 105 102 99* 100* 99*  CO2 21*  --  22 27 26 27   GLUCOSE 280* 280* 384* 138* 179* 91  BUN 49* 46* 50* 57* 71* 84*  CREATININE 1.81* 1.70* 1.97* 2.01* 2.09* 2.15*  CALCIUM 8.4*  --  8.4* 8.4* 8.5* 8.6*   Liver Function Tests:  Recent Labs Lab 01/27/16 1511  AST 28  ALT 30  ALKPHOS 59  BILITOT 0.8  PROT 6.7  ALBUMIN 3.9   No results for input(s): LIPASE, AMYLASE in the last 168 hours. No results for input(s):  AMMONIA in the last 168 hours. CBC:  Recent Labs Lab 01/27/16 1511 01/27/16 1714 01/29/16 0451  WBC 18.2*  --  19.4*  NEUTROABS 16.8*  --   --   HGB 12.3* 13.6 12.5*  HCT 38.0* 40.0 37.7*  MCV 93.1  --  91.7  PLT 193  --  183   Cardiac Enzymes: No results for input(s): CKTOTAL, CKMB, CKMBINDEX, TROPONINI in the last 168 hours. BNP: BNP (last 3 results)  Recent Labs  01/19/16 2342 01/27/16 1511 01/29/16 0452  BNP 224.0* 354.0* 246.0*    ProBNP (last 3 results) No results for input(s): PROBNP in the last 8760 hours.  CBG:  Recent Labs Lab 01/30/16 1153 01/30/16 1626 01/30/16 2019 01/31/16 0737 01/31/16 1147  GLUCAP 317* 334* 283* 68 219*    Signed:  Harbor Vanover K  [facility] 01/31/2016, 5:20 PM

## 2016-01-31 NOTE — Progress Notes (Signed)
SATURATION QUALIFICATIONS: (This note is used to comply with regulatory documentation for home oxygen)  Patient Saturations on Room Air at Rest = 98%  Patient Saturations on Room Air while Ambulating = 88%  Patient Saturations on 2 Liters of oxygen while Ambulating = 98%  Please briefly explain why patient needs home oxygen:

## 2016-02-01 ENCOUNTER — Ambulatory Visit (HOSPITAL_COMMUNITY): Payer: Medicare Other | Admitting: Physical Therapy

## 2016-02-01 ENCOUNTER — Other Ambulatory Visit: Payer: Self-pay | Admitting: *Deleted

## 2016-02-01 ENCOUNTER — Telehealth (HOSPITAL_COMMUNITY): Payer: Self-pay | Admitting: Physical Therapy

## 2016-02-01 NOTE — Patient Outreach (Signed)
[facility]) Care Management  02/01/2016  ROLLIN MESIDOR [birth year] [id number]  Received e-mail from patient's daughter who advised that her father( patient ) had been recently been hospitalized 2 times in last month. States her sister will be calling regarding some questions she has about home health services.     Received call from patient's daughter-Susan who had questions about home health services & personal care services. HIPPA verification received.  Patient was advised of differences in services and voices understanding. States her father would definitely need personal  care services & understands that services would be self pay and not covered by health insurance.   Daughter was advised of [facility] services & offered services since recently discharged from hospital. States he is already getting case management services from MD office-Jennifer. Would like for me to talk to other sister -Elnita Maxwell who is currently taking care of patient.  Telephone call to Cheryl-caregiver-daughter who advised was advised of [facility] care management services. States she is already getting calls from case manager-Jennifer case manager regarding her father's recent hospital stay. States she is taking all of father's calls because he is very hard of hering. States she will let this RN CM know if interested in [facility] services after talking with Victorino Dike tomorrow.   Plan: will follow up .   Colleen Can, RN BSN CCM Care Management Coordinator [facility] Care Management  [phone]

## 2016-02-01 NOTE — Telephone Encounter (Signed)
Patient got out of the hospital yesterday and doesn't feel like coming in today, hopes to be here next apptment. NF

## 2016-02-02 ENCOUNTER — Other Ambulatory Visit: Payer: Self-pay | Admitting: *Deleted

## 2016-02-02 NOTE — Patient Outreach (Signed)
[facility]) Care Management  02/02/2016  [name] 08/04/1928 [id number]   Telephone call to caregiver/daughter of patient. States she has not decided if she wants [facility] care management services. States she is currently in the process of checking into personal care services for her father (patient). States she will call back if she makes to decision to use [facility] services.  Plan: Will follow up. Colleen Can, RN BSN CCM Care Management Coordinator [facility] Care Management  [phone]

## 2016-02-03 ENCOUNTER — Ambulatory Visit (HOSPITAL_COMMUNITY): Payer: Medicare Other | Attending: Internal Medicine | Admitting: Physical Therapy

## 2016-02-03 DIAGNOSIS — S81801S Unspecified open wound, right lower leg, sequela: Secondary | ICD-10-CM

## 2016-02-03 DIAGNOSIS — X58XXXS Exposure to other specified factors, sequela: Secondary | ICD-10-CM | POA: Insufficient documentation

## 2016-02-03 DIAGNOSIS — R262 Difficulty in walking, not elsewhere classified: Secondary | ICD-10-CM | POA: Insufficient documentation

## 2016-02-03 NOTE — Therapy (Signed)
[place], [place], [address] Phone: [phone]   Fax:  [phone]  Wound Care Therapy  Patient Details  Name: [name] MRN: [id number] Date of Birth: [birth year] Referring Provider: Dr. Kathie Dike  Encounter Date: 02/03/2016      PT End of Session - 02/03/16 1207    Visit Number 9   Number of Visits 24   Date for PT Re-Evaluation 02/26/16   Authorization Type Medicare/Tricare for life    Authorization Time Period 12/29/15 to 02/28/16   Authorization - Visit Number 9   Authorization - Number of Visits 20   PT Start Time 3903  patient arrived late    PT Stop Time 1112   PT Time Calculation (min) 27 min   Activity Tolerance Patient tolerated treatment well   Behavior During Therapy [facility] for tasks assessed/performed      Past Medical History  Diagnosis Date  . Respiratory failure (HCC)     secondary to pleural effusion, severe COPD, ongoing tobacco abuse, and congestive heart failure)  . Heart block     with sinus pauses (status post St. Jude pacemaker by Dr. Caryl Comes)  . Chronic renal insufficiency   . Chronic atrial fibrillation (HCC)     (no Coumadin secondary to GI bleeding)  . Diabetes mellitus   . Coronary artery disease 2009    last catheterization in 2009 demonstrate a 3-vessel CAD . He underwent stenting with a right  coronary artery with a  PROMUS drug-eluting stent. He  also had a stenting of his circumflex with drug-eluting  stent previously))  . Cardiomyopathy     (EF 40%)  . Degenerative joint disease   . Hypertension   . Hyperlipidemia   . Peptic ulcer disease   . Peripheral vascular disease ([place])   . Sinoatrial node dysfunction [facility])     Past Surgical History  Procedure Laterality Date  . Cholecystectomy    . Insert / replace / remove pacemaker      St. Jude    There were no vitals filed for this visit.                  Wound Therapy - 02/03/16 1210     Subjective Patient arrives with one of his daughters, who reports patient had to go back into hospital for COPD; daughter reports taht she is working on getting them an aide and home health set up but it is not done yet, might be done in the enxt couple of weeks. Patient has not been elevating his LEs and daughter reports that they are more swollen because of this.    Patient and Family Stated Goals wounds    Pain Assessment No/denies pain   Pain Score 0-No pain   Wound Properties Date First Assessed: 12/29/15 Wound Type: Laceration Location: Tibial Location Orientation: Right Present on Admission: Yes   Dressing Type Gauze (Comment)   Dressing Status Old drainage   Dressing Change Frequency PRN   Site / Wound Assessment Granulation tissue   % Wound base Red or Granulating 100%   % Wound base Yellow 0%   Peri-wound Assessment Edema   Margins Attached edges (approximated)   Closure None   Drainage Amount None   Drainage Description No odor   Wound Therapy - Clinical Statement Patient arrives to PT after being in the hospital for an extended period of time due to COPD. He continues to have a small wound on the  lower right extremiety which is completely granualted at this poitn, as well as a couple otehr smaller wounds around his R shin. LEs swollen today. Dressed with xeroform and profore today due to edema in LE. Patient's daughter working on setting up home health and will let us know so we can discharge when it is ready to start.    Wound Therapy - Functional Problem List pain, difficulty walking    Factors Delaying/Impairing Wound Healing Altered sensation;Diabetes Mellitus;Immobility;Polypharmacy;Tobacco use;Vascular compromise   Wound Plan possible DC of R LE wounds next session                    PT Short Term Goals - 01/27/16 1558    PT SHORT TERM GOAL #1   Title Patient will demonstrate slough 0% bilateral wounds in order to demonstrate resoluation of wound infection and  improvements in healing status    Time 4   Period Weeks   Status Achieved   PT SHORT TERM GOAL #2   Title Patient will demonstrate closure of both wounsd by at least 60% in order to demonstrate improved wound healing status    Time 4   Period Weeks   Status Achieved   PT SHORT TERM GOAL #3   Title Patient's wife to be able to correctly identify signs/symptoms of wound infection and demonstrate proper wound bandaging in order to increase independence in managing condition    Time 4   Period Weeks   PT SHORT TERM GOAL #4   Title Pt strength to be increased by one grade to allow pt to come sit to stand independent and safely     Baseline 01/30/16: mod asist    Time 3   Period Weeks   Status New   PT SHORT TERM GOAL #5   Title Patient to be able to ambulate with walker for 200 feet to allow patient to be able to wallk into medical appointments.    Time 3   Period Weeks   Status New   Additional Short Term Goals   Additional Short Term Goals Yes   PT SHORT TERM GOAL #6   Title Patient to be able to single leg stance for five seconds on both LE to reduce risk of falls.    Time 3   Period Weeks   Status New           PT Long Term Goals - 01/27/16 1602    PT LONG TERM GOAL #1   Title Patient to demonstrate full wound closure bilaterally in order to demonstrate improved health condition and would healing    Time 8   Period Weeks   Status Partially Met   PT LONG TERM GOAL #2   Title Patient and his wife to be independent in exercises promoting bloodflow to lower extremities, including ankle pumps and toe/heel raises, in order to assist in reducing complications from vascualr status    Time 8   Period Weeks   Status Not Met   PT LONG TERM GOAL #3   Title Patient and his wife to be independent in safety awareness and technique in order to avoid reinjury of B LEs in order to maintain good health status and to prevent injury    Time 8   Period Weeks   Status On-going   PT LONG  TERM GOAL #4   Title Patient core and bilateral lower extremity strength to be increased to 4/5 to allow patient to step in and out  of his truck without difficulty.   Baseline 01/30/2016:  see eval    Time 8   Period Weeks   Status New   PT LONG TERM GOAL #5   Title Patient to be walking with a walker for 562f to allow pt to get the mail    Baseline 01/30/2016:  pt was able to walk for 24 feet when he needed to rest.  Pt Ox was at 90.    Time 6   Period Weeks   Status New       Education provided regarding need to DC once home heatlh starts; resasons for use of proform today; cut off profore if toes hurt  Or change color; elevate LEs       Patient will benefit from skilled therapeutic intervention in order to improve the following deficits and impairments:     Visit Diagnosis: Difficulty in walking, not elsewhere classified  Unspecified open wound, right lower leg, sequela     Problem List Patient Active Problem List   Diagnosis Date Noted  . Diabetes mellitus with hyperglycemia, without long-term current use of insulin ([facility] 01/27/2016  . Hyperkalemia 01/27/2016  . Acute respiratory failure with hypoxemia ([facility] 01/27/2016  . Acute exacerbation of chronic obstructive pulmonary disease (COPD) ([facility] 01/27/2016  . CKD (chronic kidney disease) stage 3, GFR 30-59 ml/min 01/20/2016  . Acute respiratory failure with hypoxia ([facility] 01/20/2016  . CHF (congestive heart failure) ([facility] 04/18/2014  . COPD exacerbation ([place] 04/18/2014  . Acute on chronic systolic heart failure ([facility] 10/14/2011  . Sinoatrial node dysfunction (HCC)   . TOBACCO USER 01/27/2010  . Coronary atherosclerosis 01/27/2010  . Cardiac pacemaker in situ 07/01/2009  . DIABETES MELLITUS 01/20/2009  . HYPERLIPIDEMIA 01/20/2009  . Essential hypertension 01/20/2009  . Atrial fibrillation ([facility] 01/20/2009  . ANEMIA, NORMOCYTIC 12/17/2008  . MELENA, HX OF 12/17/2008    KDeniece ReePT,  DPT [address]SAven[address] [place] [address]Phone: [phone]  Fax:  [phone] Name: Ryan FETCHMRN: [id number] of Birth: 5December 14, 1929

## 2016-02-08 ENCOUNTER — Ambulatory Visit (HOSPITAL_COMMUNITY): Payer: Medicare Other | Admitting: Physical Therapy

## 2016-02-08 ENCOUNTER — Telehealth (HOSPITAL_COMMUNITY): Payer: Self-pay | Admitting: Physical Therapy

## 2016-02-08 ENCOUNTER — Other Ambulatory Visit: Payer: Self-pay | Admitting: *Deleted

## 2016-02-08 NOTE — Telephone Encounter (Signed)
4:35 pm called to cx apptment, patient can not get down the steps and they will try again for his next apptment. NF

## 2016-02-08 NOTE — Patient Outreach (Signed)
[facility]) Care Management  02/08/2016  [name] [birth year] [id number]   Telephone follow up call; spoke with caregiver-daughter(beverly)who advised they were working on caregiver arrangments at this time.  States other sister Elnita Maxwell -caregiver was back in [place] at this time.   States has case Production designer, theatre/television/film; no consent for [facility] services received . Already has [facility] contact information if needed..  Plan: Close case. Send MD closure letter.  Colleen Can, RN BSN CCM Care Management Coordinator [facility] Care Management  [phone]

## 2016-02-11 ENCOUNTER — Encounter: Payer: Self-pay | Admitting: *Deleted

## 2016-02-12 ENCOUNTER — Inpatient Hospital Stay (HOSPITAL_COMMUNITY)
Admission: EM | Admit: 2016-02-12 | Discharge: 2016-02-16 | DRG: 291 | Disposition: A | Discharge status: Hospice | Payer: Medicare Other | Attending: Internal Medicine | Admitting: Internal Medicine

## 2016-02-12 ENCOUNTER — Emergency Department (HOSPITAL_COMMUNITY): Payer: Medicare Other

## 2016-02-12 ENCOUNTER — Encounter (HOSPITAL_COMMUNITY): Payer: Self-pay | Admitting: Emergency Medicine

## 2016-02-12 ENCOUNTER — Ambulatory Visit (HOSPITAL_COMMUNITY): Payer: Medicare Other

## 2016-02-12 DIAGNOSIS — Z7982 Long term (current) use of aspirin: Secondary | ICD-10-CM

## 2016-02-12 DIAGNOSIS — E785 Hyperlipidemia, unspecified: Secondary | ICD-10-CM | POA: Diagnosis present

## 2016-02-12 DIAGNOSIS — Z7984 Long term (current) use of oral hypoglycemic drugs: Secondary | ICD-10-CM | POA: Diagnosis not present

## 2016-02-12 DIAGNOSIS — L98429 Non-pressure chronic ulcer of back with unspecified severity: Secondary | ICD-10-CM | POA: Diagnosis present

## 2016-02-12 DIAGNOSIS — J449 Chronic obstructive pulmonary disease, unspecified: Secondary | ICD-10-CM | POA: Diagnosis present

## 2016-02-12 DIAGNOSIS — Z87891 Personal history of nicotine dependence: Secondary | ICD-10-CM | POA: Diagnosis not present

## 2016-02-12 DIAGNOSIS — E871 Hypo-osmolality and hyponatremia: Secondary | ICD-10-CM | POA: Diagnosis present

## 2016-02-12 DIAGNOSIS — L89152 Pressure ulcer of sacral region, stage 2: Secondary | ICD-10-CM | POA: Diagnosis present

## 2016-02-12 DIAGNOSIS — N183 Chronic kidney disease, stage 3 unspecified: Secondary | ICD-10-CM | POA: Diagnosis present

## 2016-02-12 DIAGNOSIS — I255 Ischemic cardiomyopathy: Secondary | ICD-10-CM | POA: Diagnosis present

## 2016-02-12 DIAGNOSIS — E0865 Diabetes mellitus due to underlying condition with hyperglycemia: Secondary | ICD-10-CM | POA: Diagnosis not present

## 2016-02-12 DIAGNOSIS — I2510 Atherosclerotic heart disease of native coronary artery without angina pectoris: Secondary | ICD-10-CM | POA: Diagnosis present

## 2016-02-12 DIAGNOSIS — E11649 Type 2 diabetes mellitus with hypoglycemia without coma: Secondary | ICD-10-CM | POA: Diagnosis present

## 2016-02-12 DIAGNOSIS — I482 Chronic atrial fibrillation: Secondary | ICD-10-CM | POA: Diagnosis present

## 2016-02-12 DIAGNOSIS — E1122 Type 2 diabetes mellitus with diabetic chronic kidney disease: Secondary | ICD-10-CM | POA: Diagnosis present

## 2016-02-12 DIAGNOSIS — I130 Hypertensive heart and chronic kidney disease with heart failure and stage 1 through stage 4 chronic kidney disease, or unspecified chronic kidney disease: Principal | ICD-10-CM | POA: Diagnosis present

## 2016-02-12 DIAGNOSIS — Z7952 Long term (current) use of systemic steroids: Secondary | ICD-10-CM

## 2016-02-12 DIAGNOSIS — I5033 Acute on chronic diastolic (congestive) heart failure: Secondary | ICD-10-CM | POA: Diagnosis present

## 2016-02-12 DIAGNOSIS — E1165 Type 2 diabetes mellitus with hyperglycemia: Secondary | ICD-10-CM | POA: Diagnosis present

## 2016-02-12 DIAGNOSIS — I272 Other secondary pulmonary hypertension: Secondary | ICD-10-CM | POA: Diagnosis present

## 2016-02-12 DIAGNOSIS — Z955 Presence of coronary angioplasty implant and graft: Secondary | ICD-10-CM

## 2016-02-12 DIAGNOSIS — Z9111 Patient's noncompliance with dietary regimen: Secondary | ICD-10-CM

## 2016-02-12 DIAGNOSIS — R601 Generalized edema: Secondary | ICD-10-CM | POA: Diagnosis present

## 2016-02-12 DIAGNOSIS — N184 Chronic kidney disease, stage 4 (severe): Secondary | ICD-10-CM | POA: Diagnosis present

## 2016-02-12 DIAGNOSIS — I10 Essential (primary) hypertension: Secondary | ICD-10-CM | POA: Diagnosis not present

## 2016-02-12 DIAGNOSIS — Z66 Do not resuscitate: Secondary | ICD-10-CM | POA: Diagnosis present

## 2016-02-12 LAB — CBC WITH DIFFERENTIAL/PLATELET
BASOS ABS: 0 10*3/uL (ref 0.0–0.1)
Basophils Relative: 0 %
EOS ABS: 0.4 10*3/uL (ref 0.0–0.7)
EOS PCT: 3 %
HCT: 36.3 % — ABNORMAL LOW (ref 39.0–52.0)
Hemoglobin: 12 g/dL — ABNORMAL LOW (ref 13.0–17.0)
LYMPHS PCT: 11 %
Lymphs Abs: 1.4 10*3/uL (ref 0.7–4.0)
MCH: 30.8 pg (ref 26.0–34.0)
MCHC: 33.1 g/dL (ref 30.0–36.0)
MCV: 93.1 fL (ref 78.0–100.0)
Monocytes Absolute: 0.9 10*3/uL (ref 0.1–1.0)
Monocytes Relative: 7 %
NEUTROS PCT: 79 %
Neutro Abs: 9.7 10*3/uL — ABNORMAL HIGH (ref 1.7–7.7)
PLATELETS: 150 10*3/uL (ref 150–400)
RBC: 3.9 MIL/uL — AB (ref 4.22–5.81)
RDW: 15 % (ref 11.5–15.5)
WBC: 12.4 10*3/uL — AB (ref 4.0–10.5)

## 2016-02-12 LAB — COMPREHENSIVE METABOLIC PANEL
ALBUMIN: 3.4 g/dL — AB (ref 3.5–5.0)
ALT: 18 U/L (ref 17–63)
AST: 17 U/L (ref 15–41)
Alkaline Phosphatase: 63 U/L (ref 38–126)
Anion gap: 4 — ABNORMAL LOW (ref 5–15)
BILIRUBIN TOTAL: 0.6 mg/dL (ref 0.3–1.2)
BUN: 57 mg/dL — ABNORMAL HIGH (ref 6–20)
CHLORIDE: 107 mmol/L (ref 101–111)
CO2: 22 mmol/L (ref 22–32)
CREATININE: 1.72 mg/dL — AB (ref 0.61–1.24)
Calcium: 8.6 mg/dL — ABNORMAL LOW (ref 8.9–10.3)
GFR calc Af Amer: 40 mL/min — ABNORMAL LOW (ref >60)
GFR calc non Af Amer: 34 mL/min — ABNORMAL LOW (ref >60)
Glucose, Bld: 197 mg/dL — ABNORMAL HIGH (ref 65–99)
POTASSIUM: 4.9 mmol/L (ref 3.5–5.1)
Sodium: 133 mmol/L — ABNORMAL LOW (ref 135–145)
Total Protein: 6.1 g/dL — ABNORMAL LOW (ref 6.5–8.1)

## 2016-02-12 LAB — BRAIN NATRIURETIC PEPTIDE: B NATRIURETIC PEPTIDE 5: 176 pg/mL — AB (ref 0.0–100.0)

## 2016-02-12 MED ORDER — INSULIN ASPART 100 UNIT/ML ~~LOC~~ SOLN: 0.0000 [IU] | Freq: Three times a day (TID) | SUBCUTANEOUS | Status: DC

## 2016-02-12 MED ORDER — EZETIMIBE 10 MG PO TABS
10.0000 mg | ORAL_TABLET | Freq: Every day | ORAL | Status: DC
  Administered 2016-02-13 – 2016-02-16 (×4): 10 mg via ORAL
  Filled 2016-02-12 (×6): qty 1

## 2016-02-12 MED ORDER — SODIUM CHLORIDE 0.9% FLUSH
3.0000 mL | Freq: Two times a day (BID) | INTRAVENOUS | Status: DC
  Administered 2016-02-12 – 2016-02-15 (×7): 3 mL via INTRAVENOUS

## 2016-02-12 MED ORDER — LEVALBUTEROL TARTRATE 45 MCG/ACT IN AERO: 1.0000 | INHALATION_SPRAY | Freq: Four times a day (QID) | RESPIRATORY_TRACT | Status: DC | PRN

## 2016-02-12 MED ORDER — ENOXAPARIN SODIUM 40 MG/0.4ML ~~LOC~~ SOLN
40.0000 mg | SUBCUTANEOUS | Status: DC
  Administered 2016-02-12 – 2016-02-15 (×4): 40 mg via SUBCUTANEOUS
  Filled 2016-02-12 (×4): qty 0.4

## 2016-02-12 MED ORDER — ASPIRIN 81 MG PO CHEW
81.0000 mg | CHEWABLE_TABLET | Freq: Every day | ORAL | Status: DC
  Administered 2016-02-13 – 2016-02-16 (×4): 81 mg via ORAL
  Filled 2016-02-12 (×4): qty 1

## 2016-02-12 MED ORDER — METFORMIN HCL 500 MG PO TABS
1000.0000 mg | ORAL_TABLET | Freq: Two times a day (BID) | ORAL | Status: DC
  Administered 2016-02-13: 1000 mg via ORAL
  Filled 2016-02-12: qty 2

## 2016-02-12 MED ORDER — SODIUM CHLORIDE 0.9% FLUSH: 3.0000 mL | INTRAVENOUS | Status: DC | PRN

## 2016-02-12 MED ORDER — PREDNISONE 10 MG PO TABS
5.0000 mg | ORAL_TABLET | Freq: Every day | ORAL | Status: AC
  Administered 2016-02-13 – 2016-02-14 (×2): 5 mg via ORAL
  Filled 2016-02-12 (×2): qty 1

## 2016-02-12 MED ORDER — SODIUM CHLORIDE 0.9 % IV SOLN: 250.0000 mL | INTRAVENOUS | Status: DC | PRN

## 2016-02-12 MED ORDER — ACETAMINOPHEN 325 MG PO TABS: 650.0000 mg | ORAL_TABLET | ORAL | Status: DC | PRN

## 2016-02-12 MED ORDER — BARRIER CREAM NON-SPECIFIED
1.0000 "application " | TOPICAL_CREAM | Freq: Two times a day (BID) | TOPICAL | Status: DC | PRN
  Filled 2016-02-12: qty 1

## 2016-02-12 MED ORDER — BISOPROLOL FUMARATE 5 MG PO TABS
5.0000 mg | ORAL_TABLET | Freq: Every day | ORAL | Status: DC
  Administered 2016-02-13 – 2016-02-16 (×4): 5 mg via ORAL
  Filled 2016-02-12 (×5): qty 1

## 2016-02-12 MED ORDER — LEVALBUTEROL HCL 0.63 MG/3ML IN NEBU
0.6300 mg | INHALATION_SOLUTION | Freq: Four times a day (QID) | RESPIRATORY_TRACT | Status: DC | PRN
  Administered 2016-02-14: 0.63 mg via RESPIRATORY_TRACT
  Filled 2016-02-12 (×2): qty 3

## 2016-02-12 MED ORDER — ONDANSETRON HCL 4 MG/2ML IJ SOLN: 4.0000 mg | Freq: Four times a day (QID) | INTRAMUSCULAR | Status: DC | PRN

## 2016-02-12 MED ORDER — GLYBURIDE 5 MG PO TABS
5.0000 mg | ORAL_TABLET | Freq: Three times a day (TID) | ORAL | Status: DC
  Administered 2016-02-13: 5 mg via ORAL
  Filled 2016-02-12: qty 1

## 2016-02-12 MED ORDER — FUROSEMIDE 10 MG/ML IJ SOLN
40.0000 mg | Freq: Once | INTRAMUSCULAR | Status: AC
  Administered 2016-02-12: 40 mg via INTRAVENOUS
  Filled 2016-02-12: qty 4

## 2016-02-12 MED ORDER — POTASSIUM CHLORIDE CRYS ER 20 MEQ PO TBCR
20.0000 meq | EXTENDED_RELEASE_TABLET | Freq: Two times a day (BID) | ORAL | Status: DC
  Administered 2016-02-12 – 2016-02-16 (×8): 20 meq via ORAL
  Filled 2016-02-12 (×8): qty 1

## 2016-02-12 MED ORDER — ISOSORBIDE MONONITRATE ER 30 MG PO TB24
30.0000 mg | ORAL_TABLET | Freq: Two times a day (BID) | ORAL | Status: DC
  Administered 2016-02-12 – 2016-02-16 (×8): 30 mg via ORAL
  Filled 2016-02-12 (×8): qty 1

## 2016-02-12 MED ORDER — IPRATROPIUM-ALBUTEROL 0.5-2.5 (3) MG/3ML IN SOLN
3.0000 mL | Freq: Four times a day (QID) | RESPIRATORY_TRACT | Status: DC | PRN
  Filled 2016-02-12: qty 3

## 2016-02-12 MED ORDER — FUROSEMIDE 10 MG/ML IJ SOLN
40.0000 mg | Freq: Every day | INTRAMUSCULAR | Status: DC
  Administered 2016-02-13: 40 mg via INTRAVENOUS
  Filled 2016-02-12: qty 4

## 2016-02-12 NOTE — ED Notes (Addendum)
Pt reports cough, BLE swelling for last several days, "sore" right buttock per EMS. Pt referred to ED by Dr. Sherwood Gambler. BLE weeping,moderate swelling noted.

## 2016-02-12 NOTE — H&P (Signed)
History and Physical  [name] ZOX:[id number] DOB: [birth year] DOA: 02/12/2016  Referring physician: Dr Criss Alvine, ED physician PCP: [name]., MD  Outpatient Specialists:   Wound Care clinic  Dr Ladona Ridgel (Cardiology)   Chief Complaint: Leg pain  HPI: [name] is a 82 y.o. male with a history of heart block, status post placement of St. Jude pacemaker, stage III chronic kidney disease, coronary artery disease with Authorization in 2009 showing 3 vessel CAD and stenting, [name] myopathy with EF of 40% in the past (last echo showed normal LVEF on 01/28/16), type 2 diabetes on oral medications, atrial fibrillation with chads score of 5 not on anticoagulation secondary to bleeds, hypertension. Patient has had 2 hospitalizations in the past month. He was initially admitted for acute hypoxic respiratory failure with COPD exacerbation. Patient never went back to baseline and return for hospitalization from 4/26 until 4/30. Patient was sent home on a prednisone taper, which he has not finished, and levofloxacin. During this time, his had an increase in his creatinine and his Lasix was stopped. The patient and his family state that no one told him to restart his Lasix after discharge. Since his discharge the patient has not taken his Lasix. His lower extremities have become extremely swollen and he has developed fluid blisters on his legs bilaterally. These blisters have fractured and drained clear fluid. His laser extremity tender, worse with ambulation and improved with rest. He has laid in the bed mostly and sat in his his recliner. He does have orthopnea and dyspnea on exertion. He has developed a pressure ulcer on his sacrum.   Review of Systems:   Pt denies any fevers, chills, nausea, vomiting, diarrhea, constipation, abdominal pain, cough, wheezing, palpitations, headache, vision changes, lightheadedness, dizziness, melena, rectal bleeding.  Review of systems are otherwise  negative  Past Medical History  Diagnosis Date  . Respiratory failure (HCC)     secondary to pleural effusion, severe COPD, ongoing tobacco abuse, and congestive heart failure)  . Heart block     with sinus pauses (status post St. Jude pacemaker by Dr. Graciela Husbands)  . Chronic renal insufficiency   . Chronic atrial fibrillation (HCC)     (no Coumadin secondary to GI bleeding)  . Diabetes mellitus   . Coronary artery disease 2009    last catheterization in 2009 demonstrate a 3-vessel CAD . He underwent stenting with a right  coronary artery with a  PROMUS drug-eluting stent. He  also had a stenting of his circumflex with drug-eluting  stent previously))  . Cardiomyopathy     (EF 40%)  . Degenerative joint disease   . Hypertension   . Hyperlipidemia   . Peptic ulcer disease   . Peripheral vascular disease (HCC)   . Sinoatrial node dysfunction [facility])    Past Surgical History  Procedure Laterality Date  . Cholecystectomy    . Insert / replace / remove pacemaker      St. Jude   Social History:  reports that he quit smoking about 8 years ago. His smoking use included Cigarettes. He has a 90 pack-year smoking history. He does not have any smokeless tobacco history on file. He reports that he drinks alcohol. His drug history is not on file. Patient lives at Home  Allergies  Allergen Reactions  . Albuterol     REACTION: agitation  . Statins   . Sulfonamide Derivatives Other (See Comments)    unknown    Family History  Problem Relation Age of  Onset  . Coronary artery disease Neg Hx       Prior to Admission medications   Medication Sig Start Date End Date Taking? Authorizing Provider  aspirin 81 MG tablet Take 81 mg by mouth daily.      Historical Provider, MD  bisoprolol (ZEBETA) 5 MG tablet Take 1 tablet (5 mg total) by mouth daily. 01/31/16   Jerald Kief, MD  ezetimibe (ZETIA) 10 MG tablet Take 10 mg by mouth daily.      Historical Provider, MD  furosemide (LASIX) 40 MG tablet  Take 40 mg by mouth daily.    Historical Provider, MD  glyBURIDE (DIABETA) 5 MG tablet Take 5 mg by mouth 3 (three) times daily.     Historical Provider, MD  GLYSET 100 MG tablet Take 1 tablet by mouth 3 (three) times daily with meals.  11/27/15   Historical Provider, MD  guaiFENesin (MUCINEX) 600 MG 12 hr tablet Take 1 tablet (600 mg total) by mouth 2 (two) times daily. 01/21/16   Erick Blinks, MD  ipratropium-albuterol (DUONEB) 0.5-2.5 (3) MG/3ML SOLN Take 3 mLs by nebulization every 6 (six) hours as needed. 01/21/16   Erick Blinks, MD  isosorbide mononitrate (IMDUR) 30 MG 24 hr tablet Take 30 mg by mouth 2 (two) times daily.      Historical Provider, MD  levalbuterol (XOPENEX HFA) 45 MCG/ACT inhaler Inhale 1 puff into the lungs every 6 (six) hours as needed for wheezing.    Historical Provider, MD  metFORMIN (GLUCOPHAGE) 500 MG tablet Take 1,000 mg by mouth 2 (two) times daily with a meal.    Historical Provider, MD  potassium chloride SA (K-DUR,KLOR-CON) 20 MEQ tablet Take 20 mEq by mouth daily.    Historical Provider, MD  predniSONE (DELTASONE) 5 MG tablet Take 1 tablet (5 mg total) by mouth daily with breakfast. 01/31/16   Jerald Kief, MD    Physical Exam: BP 146/59 mmHg  Pulse 117  Temp(Src) 97.8 F (36.6 C) (Oral)  Resp 20  Ht 5\' 10"  (1.778 m)  Wt 77.111 kg (170 lb)  BMI 24.39 kg/m2  SpO2 100%  General: Elderly Caucasian male. Awake and alert and oriented x3. No acute cardiopulmonary distress.  HEENT: Normocephalic atraumatic.  Right and left ears normal in appearance.  Pupils equal, round, reactive to light. Extraocular muscles are intact. Sclerae anicteric and noninjected.  Moist mucosal membranes. No mucosal lesions.  Neck: Neck supple without lymphadenopathy. No carotid bruits. No masses palpated.  Cardiovascular: Regular rate with normal S1-S2 sounds. No murmurs, rubs, gallops auscultated. No JVD.  Respiratory: Good respiratory effort with no wheezes, rales, rhonchi. Lungs  clear to auscultation bilaterally.  No accessory muscle use. Abdomen: Soft, nontender, nondistended. Active bowel sounds. No masses or hepatosplenomegaly  Skin: No rashes.  Dry, warm to touch. 2+ dorsalis pedis and radial pulses. There is a 5 cm erythematous area on his sacrum with skin breakdown (stage II pressure ulcer).  Additionally he has 3+ pitting edema of his lower extremities with anasarca bilaterally. There is a raw area approximately 5-6 cm in night diameter on his right lower extremity. Musculoskeletal: No calf pain. All major joints not erythematous nontender.  No upper or lower joint deformation.  Good ROM.  No contractures  Psychiatric: Intact judgment and insight. Pleasant and cooperative. Neurologic: No focal neurological deficits. Strength is 5/5 and symmetric in upper and lower extremities.  Cranial nerves II through XII are grossly intact.  Labs on Admission: I have personally reviewed following labs and imaging studies  CBC:  Recent Labs Lab 02/12/16 1828  WBC 12.4*  NEUTROABS 9.7*  HGB 12.0*  HCT 36.3*  MCV 93.1  PLT 150   Basic Metabolic Panel:  Recent Labs Lab 02/12/16 1828  NA 133*  K 4.9  CL 107  CO2 22  GLUCOSE 197*  BUN 57*  CREATININE 1.72*  CALCIUM 8.6*   GFR: Estimated Creatinine Clearance: 32.4 mL/min (by C-G formula based on Cr of 1.72). Liver Function Tests:  Recent Labs Lab 02/12/16 1828  AST 17  ALT 18  ALKPHOS 63  BILITOT 0.6  PROT 6.1*  ALBUMIN 3.4*   No results for input(s): LIPASE, AMYLASE in the last 168 hours. No results for input(s): AMMONIA in the last 168 hours. Coagulation Profile: No results for input(s): INR, PROTIME in the last 168 hours. Cardiac Enzymes: No results for input(s): CKTOTAL, CKMB, CKMBINDEX, TROPONINI in the last 168 hours. BNP (last 3 results) No results for input(s): PROBNP in the last 8760 hours. HbA1C: No results for input(s): HGBA1C in the last 72 hours. CBG: No results for  input(s): GLUCAP in the last 168 hours. Lipid Profile: No results for input(s): CHOL, HDL, LDLCALC, TRIG, CHOLHDL, LDLDIRECT in the last 72 hours. Thyroid Function Tests: No results for input(s): TSH, T4TOTAL, FREET4, T3FREE, THYROIDAB in the last 72 hours. Anemia Panel: No results for input(s): VITAMINB12, FOLATE, FERRITIN, TIBC, IRON, RETICCTPCT in the last 72 hours. Urine analysis:    Component Value Date/Time   COLORURINE YELLOW 03/12/2009 0433   APPEARANCEUR CLEAR 03/12/2009 0433   LABSPEC 1.010 03/12/2009 0433   PHURINE 5.5 03/12/2009 0433   GLUCOSEU NEGATIVE 03/12/2009 0433   HGBUR MODERATE* 03/12/2009 0433   BILIRUBINUR NEGATIVE 03/12/2009 0433   KETONESUR NEGATIVE 03/12/2009 0433   PROTEINUR NEGATIVE 03/12/2009 0433   UROBILINOGEN 0.2 03/12/2009 0433   NITRITE NEGATIVE 03/12/2009 0433   LEUKOCYTESUR NEGATIVE 03/12/2009 0433   Sepsis Labs: @LABRCNTIP (procalcitonin:4,lacticidven:4) )No results found for this or any previous visit (from the past 240 hour(s)).   Radiological Exams on Admission: Dg Chest 2 View  02/12/2016  CLINICAL DATA:  82 y/o male with c/o dyspnea, general pelvic pain, and bilateral leg swelling. CXR and pelvis ordered. Pt very SOB during exam. Hx HTN, DM, smoker, a-fib, CAD, respiratory failure, cardiomyopathy, pacemaker EXAM: CHEST  2 VIEW COMPARISON:  01/27/2016 FINDINGS: Left-sided transvenous pacemaker lead to the right ventricle. Patient is slightly rotated towards the left. The heart is mildly enlarged. There are no focal consolidations. Small pleural effusions are present. No definite pulmonary edema. Chronic changes in both shoulders and spine. IMPRESSION: 1. Mild cardiomegaly. 2. Small bilateral effusions. Electronically Signed   By: Norva Pavlov M.D.   On: 02/12/2016 18:58   Dg Pelvis 1-2 Views  02/12/2016  CLINICAL DATA:  General pelvic pain and bilateral leg swelling. EXAM: PELVIS - 1-2 VIEW COMPARISON:  02/18/2009 FINDINGS: Exam demonstrates  diffuse decreased bone mineralization. There are mild symmetric degenerative changes of the hips after stable. No evidence of fracture or dislocation. Degenerative change of the spine. Vascular calcifications are present. IMPRESSION: No acute findings per Degenerative change of the spine and hips. Electronically Signed   By: Elberta Fortis M.D.   On: 02/12/2016 18:59    EKG: Independently reviewed. Ventricularly paced rhythm.  Assessment/Plan: Active Problems:   Essential hypertension   CKD (chronic kidney disease) stage 3, GFR 30-59 ml/min   Diabetes mellitus with hyperglycemia, without long-term current use of  insulin (HCC)   Acute on chronic diastolic heart failure (HCC)   Anasarca   Stage 2 skin ulcer of sacral region    This patient was discussed with the ED physician, including pertinent vitals, physical exam findings, labs, and imaging.  We also discussed care given by the ED provider.  #1 acute on chronic heart failure  Admit  Diuresis with Lasix  Stretch I's and O's  BMP in the morning to evaluate creatinine and potassium.  Continue potassium  No need for echo as the patient had an echo during the last hospitalization  Continue beta blocker  No ACE inhibitor due to chronic kidney disease #2 anasarca  Should improve with Lasix #3 chronic kidney disease stage III  Check creatinine in the morning #4 diabetes type 2  Continue home medications with sliding scale insulin #5 essential hypertension  Continue home medications #6 stage II pressure ulcer  Barrier creams  Wound care consult  DVT prophylaxis: Lovenox Consultants: Wound care Code Status: DO NOT RESUSCITATE Family Communication: Wife and daughters in the room  Disposition Plan: Admit   Levie Heritage, DO [facility] Pager [phone]  If 7PM-7AM, please contact night-coverage [email or web address] Password [email or web address]

## 2016-02-12 NOTE — ED Provider Notes (Signed)
CSN: [id number]     Arrival date & time 02/12/16  1736 History   First MD Initiated Contact with Patient 02/12/16 1801     Chief Complaint  Patient presents with  . Leg Swelling     (Consider location/radiation/quality/duration/timing/severity/associated sxs/prior Treatment) HPI  82 year old male presents with bilateral lower extremity swelling. This has been ongoing for the past 6 days. Patient is now also having weeping, right lower extremity worse than left. No injuries. Patient has a history of chronic kidney disease as well as CHF. He is on Lasix, wife states he takes a half a pill per day. His dose has been reduced due to a worsening renal function when he was admitted last month. Patient denies any fevers. No other swelling. Has chronic cough and chronic shortness of breath but these are not worse than typical. No chest pain. Patient also has a wound over his right buttocks that family feels like started after a fall where he slid down to the ground. Patient walks but over the last week or so walks very rarely and is mostly laying around in the bed.  Past Medical History  Diagnosis Date  . Respiratory failure (HCC)     secondary to pleural effusion, severe COPD, ongoing tobacco abuse, and congestive heart failure)  . Heart block     with sinus pauses (status post [employer] Jude pacemaker by Dr. Graciela Husbands)  . Chronic renal insufficiency   . Chronic atrial fibrillation (HCC)     (no Coumadin secondary to GI bleeding)  . Diabetes mellitus   . Coronary artery disease 2009    last catheterization in 2009 demonstrate a 3-vessel CAD . He underwent stenting with a right  coronary artery with a  PROMUS drug-eluting stent. He  also had a stenting of his circumflex with drug-eluting  stent previously))  . Cardiomyopathy     (EF 40%)  . Degenerative joint disease   . Hypertension   . Hyperlipidemia   . Peptic ulcer disease   . Peripheral vascular disease (HCC)   . Sinoatrial node dysfunction [facility])      Past Surgical History  Procedure Laterality Date  . Cholecystectomy    . Insert / replace / remove pacemaker      St. Jude   Family History  Problem Relation Age of Onset  . Coronary artery disease Neg Hx    Social History  Substance Use Topics  . Smoking status: Former Smoker -- 1.50 packs/day for 60 years    Types: Cigarettes    Quit date: 06/15/2007  . Smokeless tobacco: None  . Alcohol Use: 0.0 oz/week    0 Standard drinks or equivalent per week     Comment: occasionally have "a beer"    Review of Systems  Constitutional: Negative for fever.  Respiratory: Positive for cough and shortness of breath.   Cardiovascular: Positive for leg swelling.  Gastrointestinal: Negative for abdominal pain.  Skin: Positive for wound.  All other systems reviewed and are negative.     Allergies  Albuterol; Statins; and Sulfonamide derivatives  Home Medications   Prior to Admission medications   Medication Sig Start Date End Date Taking? Authorizing Provider  aspirin 81 MG tablet Take 81 mg by mouth daily.      Historical Provider, MD  bisoprolol (ZEBETA) 5 MG tablet Take 1 tablet (5 mg total) by mouth daily. 01/31/16   Jerald Kief, MD  ezetimibe (ZETIA) 10 MG tablet Take 10 mg by mouth daily.  Historical Provider, MD  furosemide (LASIX) 40 MG tablet Take 40 mg by mouth daily.    Historical Provider, MD  glyBURIDE (DIABETA) 5 MG tablet Take 5 mg by mouth 3 (three) times daily.     Historical Provider, MD  GLYSET 100 MG tablet Take 1 tablet by mouth 3 (three) times daily with meals.  11/27/15   Historical Provider, MD  guaiFENesin (MUCINEX) 600 MG 12 hr tablet Take 1 tablet (600 mg total) by mouth 2 (two) times daily. 01/21/16   Erick Blinks, MD  hydrALAZINE (APRESOLINE) 25 MG tablet Take 12.5 mg by mouth 3 (three) times daily.     Historical Provider, MD  ipratropium-albuterol (DUONEB) 0.5-2.5 (3) MG/3ML SOLN Take 3 mLs by nebulization every 6 (six) hours as needed. 01/21/16    Erick Blinks, MD  isosorbide mononitrate (IMDUR) 30 MG 24 hr tablet Take 30 mg by mouth 2 (two) times daily.      Historical Provider, MD  levalbuterol (XOPENEX HFA) 45 MCG/ACT inhaler Inhale 1 puff into the lungs every 6 (six) hours as needed for wheezing.    Historical Provider, MD  levofloxacin (LEVAQUIN) 500 MG tablet Take 1 tablet (500 mg total) by mouth every other day. 01/31/16   Jerald Kief, MD  metFORMIN (GLUCOPHAGE) 500 MG tablet Take 1,000 mg by mouth 2 (two) times daily with a meal.    Historical Provider, MD  potassium chloride SA (K-DUR,KLOR-CON) 20 MEQ tablet Take 20 mEq by mouth daily.    Historical Provider, MD  predniSONE (DELTASONE) 5 MG tablet Take 1 tablet (5 mg total) by mouth daily with breakfast. 01/31/16   Jerald Kief, MD   BP 146/59 mmHg  Pulse 117  Temp(Src) 97.8 F (36.6 C) (Oral)  Resp 20  Ht 5\' 10"  (1.778 m)  Wt 170 lb (77.111 kg)  BMI 24.39 kg/m2  SpO2 100% Physical Exam  Constitutional: He is oriented to person, place, and time. He appears well-developed and well-nourished.  HENT:  Head: Normocephalic and atraumatic.  Right Ear: External ear normal.  Left Ear: External ear normal.  Nose: Nose normal.  Eyes: Right eye exhibits no discharge. Left eye exhibits no discharge.  Neck: Neck supple.  Cardiovascular: Normal rate, regular rhythm, normal heart sounds and intact distal pulses.   Pulmonary/Chest: Effort normal. He has rales (mild bibasilar rales).  Abdominal: Soft. There is no tenderness.  Musculoskeletal: He exhibits edema.  Severe pitting edema to bilateral LE from knees down to feet. Right anterior mid-lower leg has a superficial skin breakdown with clear weeping. L lower leg has some mild clear weeping as well  Neurological: He is alert and oriented to person, place, and time.  Skin: Skin is warm and dry. There is pallor.     Nursing note and vitals reviewed.   ED Course  Procedures (including critical care time) Labs Review Labs  Reviewed  COMPREHENSIVE METABOLIC PANEL - Abnormal; Notable for the following:    Sodium 133 (*)    Glucose, Bld 197 (*)    BUN 57 (*)    Creatinine, Ser 1.72 (*)    Calcium 8.6 (*)    Total Protein 6.1 (*)    Albumin 3.4 (*)    GFR calc non Af Amer 34 (*)    GFR calc Af Amer 40 (*)    Anion gap 4 (*)    All other components within normal limits  BRAIN NATRIURETIC PEPTIDE - Abnormal; Notable for the following:    B Natriuretic Peptide 176.0 (*)  All other components within normal limits  CBC WITH DIFFERENTIAL/PLATELET - Abnormal; Notable for the following:    WBC 12.4 (*)    RBC 3.90 (*)    Hemoglobin 12.0 (*)    HCT 36.3 (*)    Neutro Abs 9.7 (*)    All other components within normal limits  BASIC METABOLIC PANEL    Imaging Review Dg Chest 2 View  02/12/2016  CLINICAL DATA:  82 y/o male with c/o dyspnea, general pelvic pain, and bilateral leg swelling. CXR and pelvis ordered. Pt very SOB during exam. Hx HTN, DM, smoker, a-fib, CAD, respiratory failure, cardiomyopathy, pacemaker EXAM: CHEST  2 VIEW COMPARISON:  01/27/2016 FINDINGS: Left-sided transvenous pacemaker lead to the right ventricle. Patient is slightly rotated towards the left. The heart is mildly enlarged. There are no focal consolidations. Small pleural effusions are present. No definite pulmonary edema. Chronic changes in both shoulders and spine. IMPRESSION: 1. Mild cardiomegaly. 2. Small bilateral effusions. Electronically Signed   By: Norva Pavlov M.D.   On: 02/12/2016 18:58   Dg Pelvis 1-2 Views  02/12/2016  CLINICAL DATA:  General pelvic pain and bilateral leg swelling. EXAM: PELVIS - 1-2 VIEW COMPARISON:  02/18/2009 FINDINGS: Exam demonstrates diffuse decreased bone mineralization. There are mild symmetric degenerative changes of the hips after stable. No evidence of fracture or dislocation. Degenerative change of the spine. Vascular calcifications are present. IMPRESSION: No acute findings per Degenerative  change of the spine and hips. Electronically Signed   By: Elberta Fortis M.D.   On: 02/12/2016 18:59   I have personally reviewed and evaluated these images and lab results as part of my medical decision-making.   EKG Interpretation   Date/Time:  Friday Feb 12 2016 17:46:21 EDT Ventricular Rate:  65 PR Interval:  24 QRS Duration: 163 QT Interval:  485 QTC Calculation: 504 R Axis:   -177 Text Interpretation:  Ventricular-paced rhythm No further analysis  attempted due to paced rhythm Confirmed by Lisaann Atha MD, Gal Feldhaus [phone]) on  02/12/2016 6:02:23 PM      MDM   Final diagnoses:  Acute on chronic diastolic heart failure (HCC)  Anasarca  Stage 2 skin ulcer of sacral region    Patient is acute on chronically ill but stable. Severe lower extremity edema. I think the weeping is likely from edema and not infection. Will need compression and IV lasix. He is dyspneic but not in distress. Likely this is from heart failure. IV lasix, admit to hospitalist.     Pricilla Loveless, MD 02/13/16 0127

## 2016-02-13 DIAGNOSIS — I5033 Acute on chronic diastolic (congestive) heart failure: Secondary | ICD-10-CM

## 2016-02-13 DIAGNOSIS — N183 Chronic kidney disease, stage 3 (moderate): Secondary | ICD-10-CM

## 2016-02-13 DIAGNOSIS — E0865 Diabetes mellitus due to underlying condition with hyperglycemia: Secondary | ICD-10-CM

## 2016-02-13 DIAGNOSIS — I10 Essential (primary) hypertension: Secondary | ICD-10-CM

## 2016-02-13 LAB — BASIC METABOLIC PANEL
Anion gap: 6 (ref 5–15)
BUN: 55 mg/dL — ABNORMAL HIGH (ref 6–20)
CHLORIDE: 104 mmol/L (ref 101–111)
CO2: 26 mmol/L (ref 22–32)
Calcium: 8.7 mg/dL — ABNORMAL LOW (ref 8.9–10.3)
Creatinine, Ser: 1.79 mg/dL — ABNORMAL HIGH (ref 0.61–1.24)
GFR calc non Af Amer: 33 mL/min — ABNORMAL LOW (ref >60)
GFR, EST AFRICAN AMERICAN: 38 mL/min — AB (ref >60)
Glucose, Bld: 64 mg/dL — ABNORMAL LOW (ref 65–99)
Potassium: 4.6 mmol/L (ref 3.5–5.1)
SODIUM: 136 mmol/L (ref 135–145)

## 2016-02-13 LAB — GLUCOSE, CAPILLARY
GLUCOSE-CAPILLARY: 57 mg/dL — AB (ref 65–99)
Glucose-Capillary: 187 mg/dL — ABNORMAL HIGH (ref 65–99)
Glucose-Capillary: 235 mg/dL — ABNORMAL HIGH (ref 65–99)
Glucose-Capillary: 208 mg/dL — ABNORMAL HIGH (ref 65–99)

## 2016-02-13 MED ORDER — FUROSEMIDE 10 MG/ML IJ SOLN
40.0000 mg | Freq: Two times a day (BID) | INTRAMUSCULAR | Status: DC
  Administered 2016-02-14 – 2016-02-15 (×4): 40 mg via INTRAVENOUS
  Filled 2016-02-13 (×5): qty 4

## 2016-02-13 MED ORDER — INSULIN ASPART 100 UNIT/ML ~~LOC~~ SOLN
0.0000 [IU] | Freq: Three times a day (TID) | SUBCUTANEOUS | Status: DC
  Administered 2016-02-13: 2 [IU] via SUBCUTANEOUS
  Administered 2016-02-13: 3 [IU] via SUBCUTANEOUS
  Administered 2016-02-14: 2 [IU] via SUBCUTANEOUS
  Administered 2016-02-14: 3 [IU] via SUBCUTANEOUS
  Administered 2016-02-15 – 2016-02-16 (×3): 2 [IU] via SUBCUTANEOUS

## 2016-02-13 NOTE — Progress Notes (Signed)
PROGRESS NOTE        PATIENT DETAILS Name: [name] Age: 82 y.o. Sex: male Date of Birth: November 09, 1927 Admit Date: 02/12/2016 Admitting Physician Levie Heritage, DO ZOX:WRUEA,VWUJWJXB J., MD  Brief Narrative: Patient is a 82 y.o. male with numerous recent hospitalizations-history of COPD, chronic diastolic heart failure, A. 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 Found to have acute diastolic heart failure and admitted for further evaluation and treatment.  Subjective: Shortness of breath is markedly better. Continues to have significant swelling in his lower extremities.  Assessment/Plan: Active Problems: Acute on chronic diastolic heart failure (TTE 4/27 EF 55-60%): Continue IV Lasix, still volume overloaded with 3+ pitting edema. Negative balance of 2.5 L, weight 181 pounds (weight last discharge 173 pounds). Cards consult-but no coverage over the weekend.  COPD: Doubt COPD exacerbation-his clinical situation is more consistent with acute diastolic heart failure. His lungs are clear on exam today, continue bronchodilators, he is on chronic prednisone 5 mg daily which is being continued.  Hypertension: Controlled, continue bisoprolol,  Moderate pulmonary hypertension (PA pressure 49 mmHg by TTE on 4/27): Likely secondary to chronic diastolic heart failure/COPD. Stable for outpatient follow-up  Hypertension: Continue bisoprolol and Imdur. Follow and adjust accordingly  Type 2 diabetes: Hold metformin and glyburide. Mild hypoglycemic spell this morning-change as a side to sensitive and follow. Check A1c-allow mild permissive hyperglycemia in this frail 82 year old patient  Chronic kidney disease stage III: Creatinine close to baseline, follow closely while on IV diuretics  History of  atrial fibrillation:chads2vasc of at least 4-not on chronic anticoagulation due to prior history of GI bleeding. Continue bisoprolol.  History of symptomatic bradycardia-with permanent pacemaker insertion  Lower extremity ulceration: Wound care evaluation pending  Deconditioning: PT evaluation pending, appears very frail and deconditioned-with recurrent hospitalization-may require SNF placement  DVT Prophylaxis: Prophylactic Lovenox   Code Status:  DNR-reconfirmed with patient at bedside  Family Communication: None at bedside  Disposition Plan: Remain inpatient-await PT eval-but may require SNF given recurrent hospitalization  Antimicrobial agents: None  Procedures: None  CONSULTS:  None  Time spent: 25 minutes-Greater than 50% of this time was spent in counseling, explanation of diagnosis, planning of further management, and coordination of care.  MEDICATIONS: Anti-infectives    None      Scheduled Meds: . aspirin  81 mg Oral Daily  . bisoprolol  5 mg Oral Daily  . enoxaparin (LOVENOX) injection  40 mg Subcutaneous Q24H  . ezetimibe  10 mg Oral Daily  . furosemide  40 mg Intravenous BID  . insulin aspart  0-9 Units Subcutaneous TID WC  . isosorbide mononitrate  30 mg Oral BID  . potassium chloride SA  20 mEq Oral BID  . predniSONE  5 mg Oral Q breakfast  . sodium chloride flush  3 mL Intravenous Q12H   Continuous Infusions:  PRN Meds:.sodium chloride, acetaminophen, barrier cream, ipratropium-albuterol, levalbuterol, ondansetron (ZOFRAN) IV, sodium chloride flush   PHYSICAL EXAM: Vital signs: Filed Vitals:   02/12/16 2030 02/12/16 2100 02/13/16 0655 02/13/16 0912  BP: 140/63 137/67 115/49   Pulse: 60 60    Temp:   97.5 F (36.4 C)   TempSrc:   Oral   Resp: 29 25 21  Height:     (1.778 m)  Weight:    82.4 kg (181 lb 10.5 oz)  SpO2: 100% 100% 98%    Filed Weights   02/12/16 1738 02/13/16 0912  Weight: 77.111 kg (170 lb) 82.4 kg (181 lb 10.5  oz)   Body mass index is 26.07 kg/(m^2).   Gen Exam: Awake and alert with clear speech. Not in any distress  Neck: Supple, No JVD.   Chest: Bibasilar rales CVS: S1 S2 irregular Abdomen: soft, BS +, non tender, non distended.  Extremities: 3+ edema, lower extremities warm to touch. Some blistering with unroofing and ulceration in both lower extremities. Neurologic: Non Focal.   Skin: No Rash or lesions   Wounds: N/A.   LABORATORY DATA: CBC:  Recent Labs Lab 02/12/16 1828  WBC 12.4*  NEUTROABS 9.7*  HGB 12.0*  HCT 36.3*  MCV 93.1  PLT 150    Basic Metabolic Panel:  Recent Labs Lab 02/12/16 1828 02/13/16 0630  NA 133* 136  K 4.9 4.6  CL 107 104  CO2 22 26  GLUCOSE 197* 64*  BUN 57* 55*  CREATININE 1.72* 1.79*  CALCIUM 8.6* 8.7*    GFR: Estimated Creatinine Clearance: 31.2 mL/min (by C-G formula based on Cr of 1.79).  Liver Function Tests:  Recent Labs Lab 02/12/16 1828  AST 17  ALT 18  ALKPHOS 63  BILITOT 0.6  PROT 6.1*  ALBUMIN 3.4*   No results for input(s): LIPASE, AMYLASE in the last 168 hours. No results for input(s): AMMONIA in the last 168 hours.  Coagulation Profile: No results for input(s): INR, PROTIME in the last 168 hours.  Cardiac Enzymes: No results for input(s): CKTOTAL, CKMB, CKMBINDEX, TROPONINI in the last 168 hours.  BNP (last 3 results) No results for input(s): PROBNP in the last 8760 hours.  HbA1C: No results for input(s): HGBA1C in the last 72 hours.  CBG:  Recent Labs Lab 02/13/16 0759  GLUCAP 57*    Lipid Profile: No results for input(s): CHOL, HDL, LDLCALC, TRIG, CHOLHDL, LDLDIRECT in the last 72 hours.  Thyroid Function Tests: No results for input(s): TSH, T4TOTAL, FREET4, T3FREE, THYROIDAB in the last 72 hours.  Anemia Panel: No results for input(s): VITAMINB12, FOLATE, FERRITIN, TIBC, IRON, RETICCTPCT in the last 72 hours.  Urine analysis:    Component Value Date/Time   COLORURINE YELLOW 03/12/2009  0433   APPEARANCEUR CLEAR 03/12/2009 0433   LABSPEC 1.010 03/12/2009 0433   PHURINE 5.5 03/12/2009 0433   GLUCOSEU NEGATIVE 03/12/2009 0433   HGBUR MODERATE* 03/12/2009 0433   BILIRUBINUR NEGATIVE 03/12/2009 0433   KETONESUR NEGATIVE 03/12/2009 0433   PROTEINUR NEGATIVE 03/12/2009 0433   UROBILINOGEN 0.2 03/12/2009 0433   NITRITE NEGATIVE 03/12/2009 0433   LEUKOCYTESUR NEGATIVE 03/12/2009 0433    Sepsis Labs: Lactic Acid, Venous No results found for: LATICACIDVEN  MICROBIOLOGY: No results found for this or any previous visit (from the past 240 hour(s)).  RADIOLOGY STUDIES/RESULTS: Dg Chest 2 View  02/12/2016  CLINICAL DATA:  82 y/o male with c/o dyspnea, general pelvic pain, and bilateral leg swelling. CXR and pelvis ordered. Pt very SOB during exam. Hx HTN, DM, smoker, a-fib, CAD, respiratory failure, cardiomyopathy, pacemaker EXAM: CHEST  2 VIEW COMPARISON:  01/27/2016 FINDINGS: Left-sided transvenous pacemaker lead to the right ventricle. Patient is slightly rotated towards the left. The heart is mildly enlarged. There are no focal consolidations. Small pleural effusions are present. No definite pulmonary edema. Chronic changes in both shoulders and spine. IMPRESSION: 1.  Mild cardiomegaly. 2. Small bilateral effusions. Electronically Signed   By: Norva Pavlov M.D.   On: 02/12/2016 18:58   Dg Pelvis 1-2 Views  02/12/2016  CLINICAL DATA:  General pelvic pain and bilateral leg swelling. EXAM: PELVIS - 1-2 VIEW COMPARISON:  02/18/2009 FINDINGS: Exam demonstrates diffuse decreased bone mineralization. There are mild symmetric degenerative changes of the hips after stable. No evidence of fracture or dislocation. Degenerative change of the spine. Vascular calcifications are present. IMPRESSION: No acute findings per Degenerative change of the spine and hips. Electronically Signed   By: Elberta Fortis M.D.   On: 02/12/2016 18:59   Dg Chest Port 1 View  01/27/2016  CLINICAL DATA:  Shortness  of breath for 1 year, worsening over the last week. EXAM: PORTABLE CHEST 1 VIEW COMPARISON:  01/19/2016. FINDINGS: Trachea is midline. Heart size stable. Thoracic aorta is calcified. Left subclavian pacemaker lead tip projects over the right ventricle. There may be mild volume loss at the left costophrenic angle. Lungs are otherwise clear. No pleural fluid. IMPRESSION: No acute findings. Electronically Signed   By: Leanna Battles M.D.   On: 01/27/2016 15:27   Dg Chest Portable 1 View  01/20/2016  CLINICAL DATA:  Shortness of breath and wheezing for 3 days. Bilateral leg swelling. Productive cough. EXAM: PORTABLE CHEST 1 VIEW COMPARISON:  04/18/2014 FINDINGS: Cardiac pacemaker. Mild cardiac enlargement without significant pulmonary vascular congestion. No focal airspace disease or consolidation. No blunting of costophrenic angles. No pneumothorax. Calcified and tortuous aorta. Degenerative changes in the shoulders. IMPRESSION: Mild cardiac enlargement.  No evidence of active pulmonary disease. Electronically Signed   By: Burman Nieves M.D.   On: 01/20/2016 00:09     LOS: 1 day   Jeoffrey Massed, MD  [facility] Pager:[phone]  If 7PM-7AM, please contact night-coverage [email or web address] Password [email or web address] 02/13/2016, 10:04 AM

## 2016-02-14 LAB — GLUCOSE, CAPILLARY
GLUCOSE-CAPILLARY: 90 mg/dL (ref 65–99)
Glucose-Capillary: 101 mg/dL — ABNORMAL HIGH (ref 65–99)
Glucose-Capillary: 199 mg/dL — ABNORMAL HIGH (ref 65–99)
Glucose-Capillary: 238 mg/dL — ABNORMAL HIGH (ref 65–99)

## 2016-02-14 LAB — BASIC METABOLIC PANEL
Anion gap: 3 — ABNORMAL LOW (ref 5–15)
BUN: 56 mg/dL — AB (ref 6–20)
CHLORIDE: 103 mmol/L (ref 101–111)
CO2: 26 mmol/L (ref 22–32)
CREATININE: 1.87 mg/dL — AB (ref 0.61–1.24)
Calcium: 8.5 mg/dL — ABNORMAL LOW (ref 8.9–10.3)
GFR calc Af Amer: 36 mL/min — ABNORMAL LOW (ref >60)
GFR calc non Af Amer: 31 mL/min — ABNORMAL LOW (ref >60)
Glucose, Bld: 114 mg/dL — ABNORMAL HIGH (ref 65–99)
Potassium: 5 mmol/L (ref 3.5–5.1)
Sodium: 132 mmol/L — ABNORMAL LOW (ref 135–145)

## 2016-02-14 NOTE — Progress Notes (Signed)
PROGRESS NOTE        PATIENT DETAILS Name: [name] Age: 82 y.o. Sex: male Date of Birth: 11/03/1927 Admit Date: 02/12/2016 Admitting Physician Levie Heritage, DO FAO:ZHYQM,VHQIONGE J., MD  Brief Narrative: Patient is a 82 y.o. male with numerous recent hospitalizations-history of COPD, chronic diastolic heart failure, A. 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 Found to have acute diastolic heart failure and admitted for further evaluation and treatment.  Subjective:  Patient in bed, denies any headache, no chest or abdominal pain, he does not have shortness of breath at this time. No focal weakness.  Assessment/Plan:   Acute on chronic diastolic heart failure (TTE 4/27 EF 55-60%): Continue IV Lasix, still volume overloaded with 2-3+ pitting edema. Negative balance of 3.6 L, dry weight close to ?? 173 pounds. Cards consult requested -but no coverage over the weekend.   Filed Weights   02/12/16 1738 02/13/16 0912 02/14/16 0646  Weight: 77.111 kg (170 lb) 82.4 kg (181 lb 10.5 oz) 78.5 kg (173 lb 1 oz)    COPD: At baseline, no wheezing, continue bronchodilators, he is on chronic prednisone 5 mg daily which is being continued.  Hypertension: Controlled, continue bisoprolol,  Moderate pulmonary hypertension (PA pressure 49 mmHg by TTE on 4/27): Likely secondary to chronic diastolic heart failure/COPD. Stable for outpatient follow-up  Hypertension: Continue bisoprolol and Imdur. Follow and adjust accordingly  Chronic kidney disease stage III: Creatinine close to baseline, follow closely while on IV diuretics  History of atrial fibrillation:chads2vasc of at least 4-not on chronic anticoagulation due to prior history of GI bleeding. Continue bisoprolol.  History of symptomatic  bradycardia-with permanent pacemaker insertion  Lower extremity ulceration: Wound care evaluation pending  Deconditioning: PT evaluation pending, appears very frail and deconditioned-with recurrent hospitalization-may require SNF placement.  Mild hyponatremia. Due to fluid overload. Continue diuresis with Lasix.  Type 2 diabetes: Hold metformin and glyburide. Mild hypoglycemic spell this morning-change as a side to sensitive and follow. Check A1c-allow mild permissive hyperglycemia in this frail 82 year old patient   CBG (last 3)   Recent Labs  02/13/16 1709 02/13/16 2311 02/14/16 0755  GLUCAP 235* 208* 101*      DVT Prophylaxis: Prophylactic Lovenox   Code Status:  DNR-reconfirmed with patient at bedside  Family Communication: None at bedside  Disposition Plan: Remain inpatient-await PT eval-but may require SNF given recurrent hospitalization  Antimicrobial agents: None  Procedures: None  CONSULTS:  None  Time spent: 25 minutes-Greater than 50% of this time was spent in counseling, explanation of diagnosis, planning of further management, and coordination of care.  MEDICATIONS: Anti-infectives    None      Scheduled Meds: . aspirin  81 mg Oral Daily  . bisoprolol  5 mg Oral Daily  . enoxaparin (LOVENOX) injection  40 mg Subcutaneous Q24H  . ezetimibe  10 mg Oral Daily  . furosemide  40 mg Intravenous BID  . insulin aspart  0-9 Units Subcutaneous TID WC  . isosorbide mononitrate  30 mg Oral BID  . potassium chloride SA  20 mEq Oral BID  . sodium chloride flush  3 mL Intravenous Q12H   Continuous Infusions:  PRN Meds:.sodium chloride, acetaminophen, barrier cream, ipratropium-albuterol, levalbuterol, ondansetron (ZOFRAN) IV, sodium  chloride flush   PHYSICAL EXAM: Vital signs: Filed Vitals:   02/13/16 2200 02/13/16 2313 02/14/16 0329 02/14/16 0646  BP: 111/52 116/48  125/46  Pulse: 65 70  65  Temp: 97.5 F (36.4 C) 98 F (36.7 C)  97.8 F  (36.6 C)  TempSrc: Oral Oral  Oral  Resp: 20 18  15   Height:      Weight:    78.5 kg (173 lb 1 oz)  SpO2: 100% 98% 90% 100%   Filed Weights   02/12/16 1738 02/13/16 0912 02/14/16 0646  Weight: 77.111 kg (170 lb) 82.4 kg (181 lb 10.5 oz) 78.5 kg (173 lb 1 oz)   Body mass index is 24.83 kg/(m^2).   Gen Exam: Awake and alert with clear speech. Not in any distress  Neck: Supple, No JVD.   Chest: Bibasilar rales CVS: S1 S2 irregular Abdomen: soft, BS +, non tender, non distended.  Extremities: 3+ leg edema, lower extremities warm to touch. Some blistering with unroofing and ulceration in both lower extremities. Neurologic: Non Focal.   Skin: No Rash or lesions   Wounds: N/A.   LABORATORY DATA: CBC:  Recent Labs Lab 02/12/16 1828  WBC 12.4*  NEUTROABS 9.7*  HGB 12.0*  HCT 36.3*  MCV 93.1  PLT 150    Basic Metabolic Panel:  Recent Labs Lab 02/12/16 1828 02/13/16 0630 02/14/16 0621  NA 133* 136 132*  K 4.9 4.6 5.0  CL 107 104 103  CO2 22 26 26   GLUCOSE 197* 64* 114*  BUN 57* 55* 56*  CREATININE 1.72* 1.79* 1.87*  CALCIUM 8.6* 8.7* 8.5*    GFR: Estimated Creatinine Clearance: 29.8 mL/min (by C-G formula based on Cr of 1.87).  Liver Function Tests:  Recent Labs Lab 02/12/16 1828  AST 17  ALT 18  ALKPHOS 63  BILITOT 0.6  PROT 6.1*  ALBUMIN 3.4*   No results for input(s): LIPASE, AMYLASE in the last 168 hours. No results for input(s): AMMONIA in the last 168 hours.  Coagulation Profile: No results for input(s): INR, PROTIME in the last 168 hours.  Cardiac Enzymes: No results for input(s): CKTOTAL, CKMB, CKMBINDEX, TROPONINI in the last 168 hours.  BNP (last 3 results) No results for input(s): PROBNP in the last 8760 hours.  HbA1C: No results for input(s): HGBA1C in the last 72 hours.  CBG:  Recent Labs Lab 02/13/16 0759 02/13/16 1236 02/13/16 1709 02/13/16 2311 02/14/16 0755  GLUCAP 57* 187* 235* 208* 101*    Lipid Profile: No  results for input(s): CHOL, HDL, LDLCALC, TRIG, CHOLHDL, LDLDIRECT in the last 72 hours.  Thyroid Function Tests: No results for input(s): TSH, T4TOTAL, FREET4, T3FREE, THYROIDAB in the last 72 hours.  Anemia Panel: No results for input(s): VITAMINB12, FOLATE, FERRITIN, TIBC, IRON, RETICCTPCT in the last 72 hours.  Urine analysis:    Component Value Date/Time   COLORURINE YELLOW 03/12/2009 0433   APPEARANCEUR CLEAR 03/12/2009 0433   LABSPEC 1.010 03/12/2009 0433   PHURINE 5.5 03/12/2009 0433   GLUCOSEU NEGATIVE 03/12/2009 0433   HGBUR MODERATE* 03/12/2009 0433   BILIRUBINUR NEGATIVE 03/12/2009 0433   KETONESUR NEGATIVE 03/12/2009 0433   PROTEINUR NEGATIVE 03/12/2009 0433   UROBILINOGEN 0.2 03/12/2009 0433   NITRITE NEGATIVE 03/12/2009 0433   LEUKOCYTESUR NEGATIVE 03/12/2009 0433    Sepsis Labs: Lactic Acid, Venous No results found for: LATICACIDVEN  MICROBIOLOGY: No results found for this or any previous visit (from the past 240 hour(s)).  RADIOLOGY STUDIES/RESULTS: Dg Chest 2 View  02/12/2016  CLINICAL DATA:  82 y/o male with c/o dyspnea, general pelvic pain, and bilateral leg swelling. CXR and pelvis ordered. Pt very SOB during exam. Hx HTN, DM, smoker, a-fib, CAD, respiratory failure, cardiomyopathy, pacemaker EXAM: CHEST  2 VIEW COMPARISON:  01/27/2016 FINDINGS: Left-sided transvenous pacemaker lead to the right ventricle. Patient is slightly rotated towards the left. The heart is mildly enlarged. There are no focal consolidations. Small pleural effusions are present. No definite pulmonary edema. Chronic changes in both shoulders and spine. IMPRESSION: 1. Mild cardiomegaly. 2. Small bilateral effusions. Electronically Signed   By: Norva Pavlov M.D.   On: 02/12/2016 18:58   Dg Pelvis 1-2 Views  02/12/2016  CLINICAL DATA:  General pelvic pain and bilateral leg swelling. EXAM: PELVIS - 1-2 VIEW COMPARISON:  02/18/2009 FINDINGS: Exam demonstrates diffuse decreased bone  mineralization. There are mild symmetric degenerative changes of the hips after stable. No evidence of fracture or dislocation. Degenerative change of the spine. Vascular calcifications are present. IMPRESSION: No acute findings per Degenerative change of the spine and hips. Electronically Signed   By: Elberta Fortis M.D.   On: 02/12/2016 18:59   Dg Chest Port 1 View  01/27/2016  CLINICAL DATA:  Shortness of breath for 1 year, worsening over the last week. EXAM: PORTABLE CHEST 1 VIEW COMPARISON:  01/19/2016. FINDINGS: Trachea is midline. Heart size stable. Thoracic aorta is calcified. Left subclavian pacemaker lead tip projects over the right ventricle. There may be mild volume loss at the left costophrenic angle. Lungs are otherwise clear. No pleural fluid. IMPRESSION: No acute findings. Electronically Signed   By: Leanna Battles M.D.   On: 01/27/2016 15:27   Dg Chest Portable 1 View  01/20/2016  CLINICAL DATA:  Shortness of breath and wheezing for 3 days. Bilateral leg swelling. Productive cough. EXAM: PORTABLE CHEST 1 VIEW COMPARISON:  04/18/2014 FINDINGS: Cardiac pacemaker. Mild cardiac enlargement without significant pulmonary vascular congestion. No focal airspace disease or consolidation. No blunting of costophrenic angles. No pneumothorax. Calcified and tortuous aorta. Degenerative changes in the shoulders. IMPRESSION: Mild cardiac enlargement.  No evidence of active pulmonary disease. Electronically Signed   By: Burman Nieves M.D.   On: 01/20/2016 00:09     LOS: 2 days   Leroy Sea, MD  [facility] Pager:[phone]  If 7PM-7AM, please contact night-coverage [email or web address] Password [facility] 02/14/2016, 8:36 AM

## 2016-02-14 NOTE — Progress Notes (Signed)
Nurse called stating patient short of breath; Patient given xopenex nebulizer and placed on 2lpm, Patient has long history of smoking , plus history of heart problems.

## 2016-02-15 ENCOUNTER — Telehealth (HOSPITAL_COMMUNITY): Payer: Self-pay

## 2016-02-15 ENCOUNTER — Ambulatory Visit (HOSPITAL_COMMUNITY): Payer: Medicare Other | Admitting: Physical Therapy

## 2016-02-15 DIAGNOSIS — Z950 Presence of cardiac pacemaker: Secondary | ICD-10-CM

## 2016-02-15 DIAGNOSIS — I25118 Atherosclerotic heart disease of native coronary artery with other forms of angina pectoris: Secondary | ICD-10-CM

## 2016-02-15 DIAGNOSIS — I4891 Unspecified atrial fibrillation: Secondary | ICD-10-CM

## 2016-02-15 DIAGNOSIS — R601 Generalized edema: Secondary | ICD-10-CM

## 2016-02-15 LAB — GLUCOSE, CAPILLARY
GLUCOSE-CAPILLARY: 164 mg/dL — AB (ref 65–99)
Glucose-Capillary: 75 mg/dL (ref 65–99)
Glucose-Capillary: 171 mg/dL — ABNORMAL HIGH (ref 65–99)
Glucose-Capillary: 85 mg/dL (ref 65–99)

## 2016-02-15 LAB — BASIC METABOLIC PANEL
ANION GAP: 6 (ref 5–15)
BUN: 56 mg/dL — ABNORMAL HIGH (ref 6–20)
CHLORIDE: 102 mmol/L (ref 101–111)
CO2: 25 mmol/L (ref 22–32)
Calcium: 8.5 mg/dL — ABNORMAL LOW (ref 8.9–10.3)
Creatinine, Ser: 1.87 mg/dL — ABNORMAL HIGH (ref 0.61–1.24)
GFR calc Af Amer: 36 mL/min — ABNORMAL LOW (ref >60)
GFR, EST NON AFRICAN AMERICAN: 31 mL/min — AB (ref >60)
GLUCOSE: 69 mg/dL (ref 65–99)
POTASSIUM: 4.7 mmol/L (ref 3.5–5.1)
Sodium: 133 mmol/L — ABNORMAL LOW (ref 135–145)

## 2016-02-15 LAB — HEMOGLOBIN A1C
HEMOGLOBIN A1C: 8.6 % — AB (ref 4.8–5.6)
Mean Plasma Glucose: 200 mg/dL

## 2016-02-15 NOTE — Consult Note (Signed)
CARDIOLOGY CONSULT NOTE   Patient ID: [name] MRN: [id number] DOB/AGE: Dec 25, 1927 82 y.o.  Admit Date: 02/12/2016 Referring Physician: [id number] Primary Physician: [name]., MD Consulting Cardiologist: Prentice Docker MD Primary Cardiologist: Rollene Rotunda [facility]) Reason for Consultation: CHF  Clinical Summary [name] is a Medically complicated, 82 y.o.male, with known history of ischemic cardiomyopathy, coronary artery disease with stent to the right coronary artery in the remote past, St. Jude permanent pacemaker, atrial fibrillation CHADS VASC score 5 no anticoagulation due to GIB's, COPD, renal insufficiency, diabetes, hypertension, hyperlipidemia with multiple other medical issues.   He has had recent discharge from [facility] on 01/31/2016, with acute on chronic respiratory failure,CHF exacerbation, and COPD exacerbation.  On discharge she was sent home on antibiotics, and by mouth Lasix, Prednisone taper, and antibiotics. His Lasix was discontinued however secondary to increased creatinine. He did not take any Lasix at home from that point on and began to have significant fluid retention with lower extremity edema and developed blisters on his legs bilaterally.  On arrival to the emergency room the patient's blood pressure was 146/59 heart rate 117 O2 sat 100% on room air he was afebrile. He was found to have a sodium of 133, BUN 57 creatinine of 1.72. BNP 176. There was no evidence of anemia, he did have some leukocytosis presumably from prednisone. Chest x-ray revealed mild cardiomegaly with small bilateral pleural effusions. EKG revealed paced rhythm. He was treated with IV lasix. He has since diuresed 6.3 liters. We are asked for further recommendations.   Family states that he is very inactive, sits in recliner most of the day. He is also not compliant with salt restriction.    Allergies  Allergen Reactions  . Statins   . Sulfonamide Derivatives Other (See  Comments)    unknown    Medications Scheduled Medications: . aspirin  81 mg Oral Daily  . bisoprolol  5 mg Oral Daily  . enoxaparin (LOVENOX) injection  40 mg Subcutaneous Q24H  . ezetimibe  10 mg Oral Daily  . furosemide  40 mg Intravenous BID  . insulin aspart  0-9 Units Subcutaneous TID WC  . isosorbide mononitrate  30 mg Oral BID  . potassium chloride SA  20 mEq Oral BID  . sodium chloride flush  3 mL Intravenous Q12H      PRN Medications: sodium chloride, acetaminophen, barrier cream, ipratropium-albuterol, levalbuterol, ondansetron (ZOFRAN) IV, sodium chloride flush   Past Medical History  Diagnosis Date  . Respiratory failure (HCC)     secondary to pleural effusion, severe COPD, ongoing tobacco abuse, and congestive heart failure)  . Heart block     with sinus pauses (status post St. Jude pacemaker by Dr. Graciela Husbands)  . Chronic renal insufficiency   . Chronic atrial fibrillation (HCC)     (no Coumadin secondary to GI bleeding)  . Diabetes mellitus   . Coronary artery disease 2009    last catheterization in 2009 demonstrate a 3-vessel CAD . He underwent stenting with a right  coronary artery with a  PROMUS drug-eluting stent. He  also had a stenting of his circumflex with drug-eluting  stent previously))  . Cardiomyopathy     (EF 40%)  . Degenerative joint disease   . Hypertension   . Hyperlipidemia   . Peptic ulcer disease   . Peripheral vascular disease (HCC)   . Sinoatrial node dysfunction [name])     Past Surgical History  Procedure Laterality Date  . Cholecystectomy    .  Insert / replace / remove pacemaker      St. Jude    Family History  Problem Relation Age of Onset  . Coronary artery disease Neg Hx     Social History [name] reports that he quit smoking about 8 years ago. His smoking use included Cigarettes. He has a 90 pack-year smoking history. He does not have any smokeless tobacco history on file. [name] reports that he drinks  alcohol.  Review of Systems Complete review of systems are found to be negative unless outlined in H&P above.  Physical Examination Blood pressure 130/57, pulse 65, temperature 97.4 F (36.3 C), temperature source Oral, resp. rate 15, height 5\' 10"  (1.778 m), weight 166 lb 7.2 oz (75.5 kg), SpO2 98 %.  Intake/Output Summary (Last 24 hours) at 02/15/16 1101 Last data filed at 02/15/16 0900  Gross per 24 hour  Intake    360 ml  Output   2550 ml  Net  -2190 ml    Telemetry:Paced   GEN: Ill appearing, disheveled.  HEENT: Conjunctiva and lids normal, oropharynx clear with moist mucosa. Neck: Supple, no elevated JVP or carotid bruits, no thyromegaly. Lungs:Bibasilar crackles. No wheezes.  Cardiac: Regular rate and rhythm, distant heart sounds. no S3 or significant systolic murmur, no pericardial rub. Abdomen: Soft, nontender, no hepatomegaly, bowel sounds present, no guarding or rebound. Extremities: No pitting edema, distal pulses 2+.LEE with ace wraps, significant wrinkling of the feet bilaterally.  Skin: Warm and dry. Musculoskeletal: No kyphosis.Deconditioned Neuropsychiatric: Alert and oriented x3, affect grossly appropriate.  Prior Cardiac Testing/Procedures Cardiac Cath 12/2007.  CONCLUSIONS: 1. Successful percutaneous coronary intervention of tandem lesions in  the mid and proximal right coronary artery with improvement in the  mid lesion from 95% to 0% using a Promus drug-eluting stent and  improvement in the proximal stenosis from 90% to 0% using a Promus  drug-eluting stents. 2. Successful percutaneous coronary intervention of the lesion in the  circumflex marginal vessel using a Promus drug-eluting stent with  improvement in sentinel narrowing from 90% to 0%.  Pacemaker Insertion:  St. Jude   Echocardiogram 01/28/2016 Procedure narrative: Transthoracic echocardiography. Image  quality was poor. The study was technically difficult, as a   result of poor acoustic windows and poor sound wave transmission.  Intravenous contrast (Definity) was administered. - Left ventricle: The cavity size was normal. Systolic function was  normal. The estimated ejection fraction was in the range of 55%  to 60%. The study was not technically sufficient to allow  evaluation of LV diastolic dysfunction due to atrial  fibrillation. Mild concentric and moderate focal basal septal  hypertrophy. - Aortic valve: Moderately calcified annulus. Mildly thickened  leaflets. - Mitral valve: Calcified annulus. There was mild to moderate  regurgitation. - Left atrium: The atrium was moderately dilated. - Right ventricle: Pacer wire or catheter noted in right ventricle. - Right atrium: The atrium was mildly dilated. - Atrial septum: There was increased thickness of the septum,  consistent with lipomatous hypertrophy. - Tricuspid valve: There was mild regurgitation. - Pulmonary arteries: Systolic pressure was moderately increased.  PA peak pressure: 49 mm Hg (S). - Systemic veins: IVC dilated with normal respiratory variation.  Estimated CVP 8 mmHg.   Lab Results  Basic Metabolic Panel:  Recent Labs Lab 02/12/16 1828 02/13/16 0630 02/14/16 0621 02/15/16 0506  NA 133* 136 132* 133*  K 4.9 4.6 5.0 4.7  CL 107 104 103 102  CO2 22 26 26 25   GLUCOSE 197* 64*  114* 69  BUN 57* 55* 56* 56*  CREATININE 1.72* 1.79* 1.87* 1.87*  CALCIUM 8.6* 8.7* 8.5* 8.5*    Liver Function Tests:  Recent Labs Lab 02/12/16 1828  AST 17  ALT 18  ALKPHOS 63  BILITOT 0.6  PROT 6.1*  ALBUMIN 3.4*    CBC:  Recent Labs Lab 02/12/16 1828  WBC 12.4*  NEUTROABS 9.7*  HGB 12.0*  HCT 36.3*  MCV 93.1  PLT 150   BNP: 176   Radiology: FINDINGS: Exam demonstrates diffuse decreased bone mineralization. There are mild symmetric degenerative changes of the hips after stable. No evidence of fracture or dislocation. Degenerative change of  the spine. Vascular calcifications are present.  IMPRESSION: No acute findings per  Degenerative change of the spine and hips.  ECG: Paced rhythm with rate of 65 bpm.    Impression and Recommendations  1.Acute on Chronic diastolic CHF:  Recent discharge for same in April of 2017, and was sent home on lasix 40 mg daily. Family states he was not told to take lasix at home. He began to decompensate over the last 2 weeks, with worsening LEE and blistering, along with dietary non-compliance.   He has been started on IV lasix, and has diuresed 6.2 liters. Wt down from highest recorded at 181, to 166 lbs. Wt on discharge 173 lbs per note. Creatinine level this am is rising since admission with level of 1.87 vs 1.72 on admission. He continues to have evidence of fluid overload on examination. Continue IV lasix. Transition to lasix possibly tomorrow. Need to discuss end of life plans. He has a Living Will. Consider Hospice consult.   2. ICM: Continues on bisoprolol 5 mg, no ACE or ARB due to renal insufficiency.   3. CAD: Hx of stents to RCA and Cx. He is not on antiplatelet therapy due to hx of GIB bleed. On ASA  4. Atrial Fib: Heart rate is currently well controlled. Not anticoagulant candidate for frequent GIB.   5. Hypertension: BP is slightly elevated for reduced EF. Will monitor as he diureses.    Signed: Bettey Mare. Lawrence NP [facility]  02/15/2016, 11:01 AM Co-Sign MD  The patient was seen and examined, and I agree with the history, physical exam, assessment and plan as documented above which has been discussed with Harriet Pho NP, with modifications as noted below.  82 yr old male with aforementioned history and presentation admitted with acute on chronic diastolic heart failure. Was recently hospitalized for this in combination with COPD exacerbation. Discharged on 40 mg Lasix daily but PCP decreased to 20 mg every other day due to concerns regarding worsening renal  function. Pacemaker functioning normally when interrogated by Dr. Ladona Ridgel in 01/2016. Echo 01/28/16 showed normalization of LVEF as compared to echo 04/19/14.  Has had 2 falls at home in recent past as per daughter. Has been diuresing well but agree with another day of IV diuresis. Will need Lasix 20-40 mg daily upon discharge.  Had a lengthy discussion with daughter about placing parents in assisted living. They are in the process of doing so. He is symptomatically stable with respect to CAD. Poor anticoagulation candidate for atrial fibrillation due to prior h/o GI bleeds. Prefers to f/u in our [place] office due to long drive to [place] as per wife and daughter. He can fu with me going forward.  Prentice Docker, MD, [facility]  02/15/2016 11:26 AM

## 2016-02-15 NOTE — Progress Notes (Signed)
PROGRESS NOTE        PATIENT DETAILS Name: [name] Age: 82 y.o. Sex: male Date of Birth: Aug 17, 1928 Admit Date: 02/12/2016 Admitting Physician Levie Heritage, DO ZOX:WRUEA,VWUJWJXB J., MD  Brief Narrative: Patient is a 82 y.o. male with numerous recent hospitalizations-history of COPD, chronic diastolic heart failure, A. 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 Found to have acute diastolic heart failure and admitted for further evaluation and treatment.  Subjective:  Patient in bed, denies any headache, no chest or abdominal pain, he does not have shortness of breath at this time. No focal weakness.  Assessment/Plan:   Acute on chronic diastolic heart failure (TTE 4/27 EF 55-60%): Continue IV Lasix, still volume overloaded with 2-3+ pitting edema. Negative balance of 6.5 L, dry weight close to ?? 173 pounds. Cards consult requested.   Filed Weights   02/13/16 0912 02/14/16 0646 02/15/16 0600  Weight: 82.4 kg (181 lb 10.5 oz) 78.5 kg (173 lb 1 oz) 75.5 kg (166 lb 7.2 oz)    COPD: At baseline, no wheezing, continue bronchodilators, he is on chronic prednisone 5 mg daily which is being continued.  Hypertension: Controlled, continue bisoprolol,  Moderate pulmonary hypertension (PA pressure 49 mmHg by TTE on 4/27): Likely secondary to chronic diastolic heart failure/COPD. Stable for outpatient follow-up  Hypertension: Continue bisoprolol and Imdur. Follow and adjust accordingly  Chronic kidney disease stage III: Creatinine close to baseline, follow closely while on IV diuretics  History of atrial fibrillation:chads2vasc of at least 4-not on chronic anticoagulation due to prior history of GI bleeding. Continue bisoprolol.  History of symptomatic bradycardia-with permanent pacemaker  insertion  Lower extremity ulceration: Wound care evaluation pending  Deconditioning: PT evaluation pending, appears very frail and deconditioned-with recurrent hospitalization-may require SNF placement.  Mild hyponatremia. Due to fluid overload. Continue diuresis with Lasix.  Type 2 diabetes: Hold metformin and glyburide. Mild hypoglycemic spell this morning-change as a side to sensitive and follow. Check A1c-allow mild permissive hyperglycemia in this frail 82 year old patient   CBG (last 3)   Recent Labs  02/14/16 1627 02/14/16 2211 02/15/16 0754  GLUCAP 238* 90 75      DVT Prophylaxis: Prophylactic Lovenox   Code Status:  DNR-reconfirmed with patient at bedside  Family Communication: None at bedside  Disposition Plan: Remain inpatient-await PT eval-but may require SNF given recurrent hospitalization  Antimicrobial agents: None  Procedures: None  CONSULTS: Cards  Time spent: 25 minutes-Greater than 50% of this time was spent in counseling, explanation of diagnosis, planning of further management, and coordination of care.  MEDICATIONS: Anti-infectives    None      Scheduled Meds: . aspirin  81 mg Oral Daily  . bisoprolol  5 mg Oral Daily  . enoxaparin (LOVENOX) injection  40 mg Subcutaneous Q24H  . ezetimibe  10 mg Oral Daily  . furosemide  40 mg Intravenous BID  . insulin aspart  0-9 Units Subcutaneous TID WC  . isosorbide mononitrate  30 mg Oral BID  . potassium chloride SA  20 mEq Oral BID  . sodium chloride flush  3 mL Intravenous Q12H   Continuous Infusions:  PRN Meds:.sodium chloride, acetaminophen, barrier cream, ipratropium-albuterol, levalbuterol, ondansetron (ZOFRAN) IV, sodium chloride flush   PHYSICAL  EXAM: Vital signs: Filed Vitals:   02/14/16 0646 02/14/16 1524 02/14/16 2215 02/15/16 0600  BP: 125/46 123/51 130/57   Pulse: 65 67 65   Temp: 97.8 F (36.6 C) 98.1 F (36.7 C) 97.4 F (36.3 C)   TempSrc: Oral Oral Oral     Resp: 15 18 15    Height:      Weight: 78.5 kg (173 lb 1 oz)   75.5 kg (166 lb 7.2 oz)  SpO2: 100% 100% 98%    Filed Weights   02/13/16 0912 02/14/16 0646 02/15/16 0600  Weight: 82.4 kg (181 lb 10.5 oz) 78.5 kg (173 lb 1 oz) 75.5 kg (166 lb 7.2 oz)   Body mass index is 23.88 kg/(m^2).   Gen Exam: Awake and alert with clear speech. Not in any distress  Neck: Supple, No JVD.   Chest: Bibasilar rales CVS: S1 S2 irregular Abdomen: soft, BS +, non tender, non distended.  Extremities: 2+ leg edema, lower extremities warm to touch. Some blistering with unroofing and ulceration in both lower extremities. Neurologic: Non Focal.   Skin: No Rash or lesions   Wounds: N/A.   LABORATORY DATA: CBC:  Recent Labs Lab 02/12/16 1828  WBC 12.4*  NEUTROABS 9.7*  HGB 12.0*  HCT 36.3*  MCV 93.1  PLT 150    Basic Metabolic Panel:  Recent Labs Lab 02/12/16 1828 02/13/16 0630 02/14/16 0621 02/15/16 0506  NA 133* 136 132* 133*  K 4.9 4.6 5.0 4.7  CL 107 104 103 102  CO2 22 26 26 25   GLUCOSE 197* 64* 114* 69  BUN 57* 55* 56* 56*  CREATININE 1.72* 1.79* 1.87* 1.87*  CALCIUM 8.6* 8.7* 8.5* 8.5*    GFR: Estimated Creatinine Clearance: 29.8 mL/min (by C-G formula based on Cr of 1.87).  Liver Function Tests:  Recent Labs Lab 02/12/16 1828  AST 17  ALT 18  ALKPHOS 63  BILITOT 0.6  PROT 6.1*  ALBUMIN 3.4*   No results for input(s): LIPASE, AMYLASE in the last 168 hours. No results for input(s): AMMONIA in the last 168 hours.  Coagulation Profile: No results for input(s): INR, PROTIME in the last 168 hours.  Cardiac Enzymes: No results for input(s): CKTOTAL, CKMB, CKMBINDEX, TROPONINI in the last 168 hours.  BNP (last 3 results) No results for input(s): PROBNP in the last 8760 hours.  HbA1C: No results for input(s): HGBA1C in the last 72 hours.  CBG:  Recent Labs Lab 02/14/16 0755 02/14/16 1105 02/14/16 1627 02/14/16 2211 02/15/16 0754  GLUCAP 101* 199* 238*  90 75    Lipid Profile: No results for input(s): CHOL, HDL, LDLCALC, TRIG, CHOLHDL, LDLDIRECT in the last 72 hours.  Thyroid Function Tests: No results for input(s): TSH, T4TOTAL, FREET4, T3FREE, THYROIDAB in the last 72 hours.  Anemia Panel: No results for input(s): VITAMINB12, FOLATE, FERRITIN, TIBC, IRON, RETICCTPCT in the last 72 hours.  Urine analysis:    Component Value Date/Time   COLORURINE YELLOW 03/12/2009 0433   APPEARANCEUR CLEAR 03/12/2009 0433   LABSPEC 1.010 03/12/2009 0433   PHURINE 5.5 03/12/2009 0433   GLUCOSEU NEGATIVE 03/12/2009 0433   HGBUR MODERATE* 03/12/2009 0433   BILIRUBINUR NEGATIVE 03/12/2009 0433   KETONESUR NEGATIVE 03/12/2009 0433   PROTEINUR NEGATIVE 03/12/2009 0433   UROBILINOGEN 0.2 03/12/2009 0433   NITRITE NEGATIVE 03/12/2009 0433   LEUKOCYTESUR NEGATIVE 03/12/2009 0433    Sepsis Labs: Lactic Acid, Venous No results found for: LATICACIDVEN  MICROBIOLOGY: No results found for this or any previous visit (from  the past 240 hour(s)).  RADIOLOGY STUDIES/RESULTS: Dg Chest 2 View  02/12/2016  CLINICAL DATA:  82 y/o male with c/o dyspnea, general pelvic pain, and bilateral leg swelling. CXR and pelvis ordered. Pt very SOB during exam. Hx HTN, DM, smoker, a-fib, CAD, respiratory failure, cardiomyopathy, pacemaker EXAM: CHEST  2 VIEW COMPARISON:  01/27/2016 FINDINGS: Left-sided transvenous pacemaker lead to the right ventricle. Patient is slightly rotated towards the left. The heart is mildly enlarged. There are no focal consolidations. Small pleural effusions are present. No definite pulmonary edema. Chronic changes in both shoulders and spine. IMPRESSION: 1. Mild cardiomegaly. 2. Small bilateral effusions. Electronically Signed   By: Norva Pavlov M.D.   On: 02/12/2016 18:58   Dg Pelvis 1-2 Views  02/12/2016  CLINICAL DATA:  General pelvic pain and bilateral leg swelling. EXAM: PELVIS - 1-2 VIEW COMPARISON:  02/18/2009 FINDINGS: Exam  demonstrates diffuse decreased bone mineralization. There are mild symmetric degenerative changes of the hips after stable. No evidence of fracture or dislocation. Degenerative change of the spine. Vascular calcifications are present. IMPRESSION: No acute findings per Degenerative change of the spine and hips. Electronically Signed   By: Elberta Fortis M.D.   On: 02/12/2016 18:59   Dg Chest Port 1 View  01/27/2016  CLINICAL DATA:  Shortness of breath for 1 year, worsening over the last week. EXAM: PORTABLE CHEST 1 VIEW COMPARISON:  01/19/2016. FINDINGS: Trachea is midline. Heart size stable. Thoracic aorta is calcified. Left subclavian pacemaker lead tip projects over the right ventricle. There may be mild volume loss at the left costophrenic angle. Lungs are otherwise clear. No pleural fluid. IMPRESSION: No acute findings. Electronically Signed   By: Leanna Battles M.D.   On: 01/27/2016 15:27   Dg Chest Portable 1 View  01/20/2016  CLINICAL DATA:  Shortness of breath and wheezing for 3 days. Bilateral leg swelling. Productive cough. EXAM: PORTABLE CHEST 1 VIEW COMPARISON:  04/18/2014 FINDINGS: Cardiac pacemaker. Mild cardiac enlargement without significant pulmonary vascular congestion. No focal airspace disease or consolidation. No blunting of costophrenic angles. No pneumothorax. Calcified and tortuous aorta. Degenerative changes in the shoulders. IMPRESSION: Mild cardiac enlargement.  No evidence of active pulmonary disease. Electronically Signed   By: Burman Nieves M.D.   On: 01/20/2016 00:09     LOS: 3 days   Leroy Sea, MD  [facility] Pager:[phone]  If 7PM-7AM, please contact night-coverage [email or web address] Password [facility] 02/15/2016, 9:39 AM

## 2016-02-15 NOTE — Consult Note (Signed)
WOC wound consult note Reason for Consult: Edema to lower extremities, refuses "tight wraps"  Ruptured serum filled blisters.  Stage 3 pressure injury to sacrum.  Wound type:Pressure injury.  Chronic venous insufficiency Pressure Ulcer POA: Yes Measurement:Sacrum/upper right buttocks 2.2 cm x 0.5 cm x 0.1 cm pink and moist Right anterior lower leg.  Ruptured blister.   Pink and moist LEft anterior lower leg  Ruptured blister. pInk and moist Wound ION:GEXB and moist Drainage (amount, consistency, odor) Minimal serosanguinous Periwound:Intact.  Lower legs/feet with generalized edema.  Refuses multilayer compression. Agrees to ace compression for now.  Dressing procedure/placement/frequency:CLeanse bilateral lower legs with NS and pat gently dry.  Apply vaseline gauze to ruptured blisters on bilateral lower legs. Wrap from below toes to just below knee with kerlix, followed by ace wrap for minimal compression.  Patient refuses Profore. Change twice weekly.  Monday and Thursday.   Cleanse sacral wound with soap and water and pat dry.  Apply silicone border foam dressing.  Change every 3 days and PRN soilage.    Will not follow at this time.  Please re-consult if needed.  Maple Hudson RN BSN CWON Pager [phone]

## 2016-02-15 NOTE — Care Management Note (Signed)
Case Management Note  Patient Details  Name: [name] MRN: [id number] Date of Birth: 03/01/1928  Subjective/Objective:Spoke with family of patient at the bedside. Daughters [name] [phone] and [name] [phone]  and spouse [name] [phone]. Patient is from home with spouse and has only a walker. No Home O2.  According to the family Cardiology has seen patient today and recommended that they start to think about home with hospice. Family is open to this Idea.  Spoke with patient care manager at Dr Ignacia Palma [facility]) office concerning case as well. Victorino Dike [phone] x [phone].  Contacted Dr Thedore Mins who gave telephone order for palliative consult. InformedTasha Dove at Palliative care. Offered choice of HH providers but no preference. Daughter [name] stated to use anybody but [facility].  Will know more after palliative consult.                       Action/Plan: Home with hospice.   Expected Discharge Date:                  Expected Discharge Plan:  Home w Hospice Care  In-House Referral:  Hospice / Palliative Care  Discharge planning Services  CM Consult  Post Acute Care Choice:    Choice offered to:     DME Arranged:    DME Agency:     HH Arranged:    HH Agency:     Status of Service:  In process, will continue to follow  Medicare Important Message Given:  Yes Date Medicare IM Given:    Medicare IM give by:    Date Additional Medicare IM Given:    Additional Medicare Important Message give by:     If discussed at Long Length of Stay Meetings, dates discussed:    Additional Comments:  Adonis Huguenin, RN 02/15/2016, 2:08 PM

## 2016-02-15 NOTE — Care Management Important Message (Signed)
Important Message  Patient Details  Name: [name] MRN: [id number] Date of Birth: [birth year]   Medicare Important Message Given:  Yes    Adonis Huguenin, RN 02/15/2016, 9:16 AM

## 2016-02-15 NOTE — Progress Notes (Addendum)
LCSW in consultation with Care Manager stated that the family of patient is agreeable to take patient home and to follow with Hospice Care No further needs at this time. LCSW was asked initially to consult to see if family would consider  Potential pt recommendation for SNF placement  Domnick Chervenak LCSW

## 2016-02-15 NOTE — Evaluation (Signed)
Physical Therapy Evaluation Patient Details Name: [name] MRN: [id number] DOB: [birth year] Today's Date: 02/15/2016   History of Present Illness  82 yo M admitted with acute on chronic heart failure.  Pt d/c from hospital ~2wks ago and was not re-started on lasix, and therefore developed fluid blisters on B LE's that ruptured and drained clear fluid. Per H&P, pt also has a sacral pressure ulcer. Pt has also had 2 recent admissions for COPD exacerbation.  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    Clinical Impression  Pt received in bed, and was agreeable to PT evaluation, but states "I'm not going to push it."  Pt lives with his wife, and states that normally he ambulates with a cane inside the house.  Today, pt required Mod A for sit<>stand transfer, and bed<>chair transfer with RW.  Pt expressed a fear of falling when attempting to get up.  Educated pt on importance of mobility in order to get over the fear.  Wife and 2 dtr's arrive at end of tx, who express desire for pt to go home at d/c, and that they are looking into hospice vs HH.  Pt would benefit from HHPT for balance, strength, and endurance    Follow Up Recommendations Home health PT;Supervision/Assistance - 24 hour    Equipment Recommendations  None recommended by PT    Recommendations for Other Services       Precautions / Restrictions Precautions Precautions: Fall Precaution Comments: Pt states that he has not fallen in the last 6 months, however he is extremely fearful of falling during mobility assessment.  Restrictions Weight Bearing Restrictions: No      Mobility  Bed Mobility Overal bed mobility: Needs Assistance Bed Mobility: Supine to Sit           General bed mobility comments: Pt continues to require vc's for hand placement and encouragement to push from the bed, not pull on PT.    Transfers Overall transfer  level: Needs assistance Equipment used: Rolling walker (2 wheeled) Transfers: Sit to/from [employer] Sit to Stand: Mod assist Stand pivot transfers: Min assist (with RW. )       General transfer comment: multiple cues for hand placement.  Pt required increased time sitting on EOB prior to standing due to fear of falling with pt repeating "don't let me fall."   Ambulation/Gait                Stairs            Wheelchair Mobility    Modified Rankin (Stroke Patients Only)       Balance Overall balance assessment: Needs assistance Sitting-balance support: Bilateral upper extremity supported;Feet supported Sitting balance-Leahy Scale: Fair     Standing balance support: Bilateral upper extremity supported Standing balance-Leahy Scale: Poor                               Pertinent Vitals/Pain Pain Assessment: No/denies pain    Home Living   Living Arrangements: Spouse/significant other Available Help at Discharge: Family (Dtr nearby and assists with running errands and groceries. ) Type of Home: House Home Access: Stairs to enter Entrance Stairs-Rails: Can reach both Entrance Stairs-Number of Steps: 6 Home Layout: One level Home Equipment: Cane - single point;Walker - 2 wheels;Shower seat;Wheelchair - manual      Prior  Function     Gait / Transfers Assistance Needed: Pt states that he uses the cane inside the house, and uses the RW when out in the community.   ADL's / Homemaking Assistance Needed: Pt states that he is independent with ADL's, but pt's dtr assists pt with running errands.         Hand Dominance        Extremity/Trunk Assessment   Upper Extremity Assessment: Generalized weakness           Lower Extremity Assessment: Generalized weakness         Communication   Communication: No difficulties  Cognition Arousal/Alertness: Awake/alert Behavior During Therapy: WFL for tasks  assessed/performed Overall Cognitive Status: Within Functional Limits for tasks assessed                      General Comments      Exercises        Assessment/Plan    PT Assessment Patient needs continued PT services  PT Diagnosis Difficulty walking;Generalized weakness   PT Problem List Decreased strength;Decreased activity tolerance;Decreased balance;Decreased mobility;Decreased coordination;Decreased knowledge of use of DME;Decreased safety awareness;Decreased knowledge of precautions;Cardiopulmonary status limiting activity;Decreased skin integrity  PT Treatment Interventions Gait training;DME instruction;Functional mobility training;Therapeutic activities;Therapeutic exercise;Balance training;Patient/family education   PT Goals (Current goals can be found in the Care Plan section) Acute Rehab PT Goals Patient Stated Goal: Pt wants to be able to get his strengh back to what he was doing before.  PT Goal Formulation: With patient Time For Goal Achievement: 02/11/16 Potential to Achieve Goals: Fair    Frequency Min 5X/week   Barriers to discharge Decreased caregiver support Pt lives with wife, who is frail and visits him today in a w/c, and also uses O2.  Dtr's are only able to assist pt in the afternoon.      Co-evaluation               End of Session Equipment Utilized During Treatment: Gait belt;Oxygen Activity Tolerance: Patient tolerated treatment well Patient left: in chair;with call bell/phone within reach;with chair alarm set;with family/visitor present Nurse Communication: Mobility status    Functional Assessment Tool Used: [employer] AM-Pax "6-clicks"  Functional Limitation: Mobility: Walking and moving around Mobility: Walking and Moving Around Current Status [phone]): At least 60 percent but less than 80 percent impaired, limited or restricted Mobility: Walking and Moving Around Goal Status [phone]): At least 40 percent but less than 60  percent impaired, limited or restricted    Time: 1251-1320 PT Time Calculation (min) (ACUTE ONLY): 29 min   Charges:   PT Evaluation $PT Eval Moderate Complexity: 1 Procedure PT Treatments $Therapeutic Activity: 8-22 mins   PT G Codes:   PT G-Codes **NOT FOR INPATIENT CLASS** Functional Assessment Tool Used: [employer] AM-Pax "6-clicks"  Functional Limitation: Mobility: Walking and moving around Mobility: Walking and Moving Around Current Status [phone]): At least 60 percent but less than 80 percent impaired, limited or restricted Mobility: Walking and Moving Around Goal Status [phone]): At least 40 percent but less than 60 percent impaired, limited or restricted    Carollee Herter, PT, DPT X: [phone]   02/15/2016, 3:22 PM

## 2016-02-15 NOTE — Telephone Encounter (Signed)
02/15/16 daughter left a message that he was back in the hospital, his legs are swollen and seeping... Just cx for 5/15

## 2016-02-16 ENCOUNTER — Telehealth (HOSPITAL_COMMUNITY): Payer: Self-pay

## 2016-02-16 DIAGNOSIS — I2510 Atherosclerotic heart disease of native coronary artery without angina pectoris: Secondary | ICD-10-CM | POA: Insufficient documentation

## 2016-02-16 LAB — BASIC METABOLIC PANEL
ANION GAP: 8 (ref 5–15)
BUN: 64 mg/dL — AB (ref 6–20)
CHLORIDE: 101 mmol/L (ref 101–111)
CO2: 24 mmol/L (ref 22–32)
Calcium: 8.4 mg/dL — ABNORMAL LOW (ref 8.9–10.3)
Creatinine, Ser: 2.13 mg/dL — ABNORMAL HIGH (ref 0.61–1.24)
GFR calc Af Amer: 31 mL/min — ABNORMAL LOW (ref >60)
GFR, EST NON AFRICAN AMERICAN: 27 mL/min — AB (ref >60)
GLUCOSE: 80 mg/dL (ref 65–99)
POTASSIUM: 4.8 mmol/L (ref 3.5–5.1)
SODIUM: 133 mmol/L — AB (ref 135–145)

## 2016-02-16 LAB — GLUCOSE, CAPILLARY
GLUCOSE-CAPILLARY: 92 mg/dL (ref 65–99)
GLUCOSE-CAPILLARY: 200 mg/dL — AB (ref 65–99)

## 2016-02-16 LAB — MAGNESIUM: MAGNESIUM: 1.9 mg/dL (ref 1.7–2.4)

## 2016-02-16 MED ORDER — LORAZEPAM 2 MG/ML PO CONC: 1.0000 mg | Freq: Four times a day (QID) | ORAL | Status: AC | PRN

## 2016-02-16 MED ORDER — MORPHINE SULFATE (CONCENTRATE) 10 MG/0.5ML PO SOLN: 10.0000 mg | ORAL | Status: AC | PRN

## 2016-02-16 MED ORDER — ENOXAPARIN SODIUM 30 MG/0.3ML ~~LOC~~ SOLN: 30.0000 mg | SUBCUTANEOUS | Status: DC

## 2016-02-16 MED ORDER — FUROSEMIDE 40 MG PO TABS: 40.0000 mg | ORAL_TABLET | Freq: Every day | ORAL | Status: AC

## 2016-02-16 MED ORDER — FUROSEMIDE 40 MG PO TABS: 40.0000 mg | ORAL_TABLET | Freq: Every day | ORAL | Status: DC

## 2016-02-16 NOTE — Progress Notes (Signed)
Pt profile: 82 y.o.male, with known history of ischemic cardiomyopathy, coronary artery disease with stent to the right coronary artery in the remote past, St. Jude permanent pacemaker, atrial fibrillation CHADS VASC score 5 no anticoagulation due to GIB's, COPD, renal insufficiency, diabetes, hypertension, hyperlipidemia with multiple other medical issues. Now admitted with fluid retention, edema and blisters on legs.  Issues with increasing cr on lasix.   Subjective: No complaints, ready to go home  Objective: Vital signs in last 24 hours: Temp:  [97.5 F (36.4 C)-97.9 F (36.6 C)] 97.5 F (36.4 C) (05/15 2146) Pulse Rate:  [59-65] 59 (05/15 2146) Resp:  [16-20] 20 (05/15 2146) BP: (101-121)/(50-66) 121/50 mmHg (05/15 2146) SpO2:  [98 %-100 %] 100 % (05/15 2146) Weight change:  Last BM Date: 02/14/16 Intake/Output from previous day: -1870 05/15 0701 - 05/16 0700 In: 180 [P.O.:180] Out: 2050 [Urine:2050] Intake/Output this shift:    PE: General:Pleasant affect, NAD Skin:Warm and dry, brisk capillary refill, multiple bruising sites HEENT:normocephalic, sclera clear, mucus membranes moist Heart:S1S2 RRR without murmur, gallup, rub or click Lungs:clear, ant. without rales, rhonchi, or wheezes BJY:NWGN, non tender, + BS, do not palpate liver spleen or masses Ext:no to trace lower ext edema, legs wrapped, 2+ radial pulses Neuro:alert and oriented X 3, MAE, follows commands, + facial symmetry Tele:  A fib with V pacing + PVCs   Lab Results: No results for input(s): WBC, HGB, HCT, PLT in the last 72 hours. BMET  Recent Labs  02/15/16 0506 02/16/16 0454  NA 133* 133*  K 4.7 4.8  CL 102 101  CO2 25 24  GLUCOSE 69 80  BUN 56* 64*  CREATININE 1.87* 2.13*  CALCIUM 8.5* 8.4*   No results for input(s): TROPONINI in the last 72 hours.  Invalid input(s): CK, MB  Lab Results  Component Value Date   CHOL  01/18/2008    192        ATP III CLASSIFICATION:  <200      mg/dL   Desirable  562-130  mg/dL   Borderline High  >=865    mg/dL   High   HDL 50 78/46/9629   LDLCALC * 01/18/2008    121        Total Cholesterol/HDL:CHD Risk Coronary Heart Disease Risk Table                     Men   Women  1/2 Average Risk   3.4   3.3   TRIG 104 01/18/2008   CHOLHDL 3.8 01/18/2008   Lab Results  Component Value Date   HGBA1C 8.6* 02/13/2016          Studies/Results: No results found.  Medications: I have reviewed the patient's current medications. Scheduled Meds: . aspirin  81 mg Oral Daily  . bisoprolol  5 mg Oral Daily  . enoxaparin (LOVENOX) injection  40 mg Subcutaneous Q24H  . ezetimibe  10 mg Oral Daily  . [START ON 02/17/2016] furosemide  40 mg Oral Daily  . insulin aspart  0-9 Units Subcutaneous TID WC  . isosorbide mononitrate  30 mg Oral BID  . potassium chloride SA  20 mEq Oral BID   Continuous Infusions:  PRN Meds:.acetaminophen, barrier cream, ipratropium-albuterol, levalbuterol, ondansetron (ZOFRAN) IV  Assessment/Plan: 1.Acute on Chronic diastolic CHF: Recent discharge for same in April of 2017, and was sent home on lasix 40 mg daily. Family states he was not told to take lasix at home. He  began to decompensate over the last 2 weeks, with worsening LEE and blistering, along with dietary non-compliance.   He had been started on IV lasix, and has diuresed 8.2 liters. Wt down from highest recorded at 181, to 166 lbs.  Creatinine level this am is rising since admission with level of 2.13 vs 1.72 on admission. His IV lasix has been stopped with plans to begin po lasix 02/17/16.  He has a Living Will. He is DNR.  Consider Hospice consult.   He is to be discharged, will arrange follow up with Dr. Antoine Poche.   2. ICM: Continues on bisoprolol 5 mg, no ACE or ARB due to renal insufficiency.  Now with EF 55-60% PA pk pressure 49 mmHg.   3. CAD: Hx of stents to RCA and Cx. He is not on antiplatelet therapy due to hx of GIB bleed. On ASA  4.  Atrial Fib: Heart rate is currently well controlled. Not anticoagulant candidate for frequent GIB.    5. Hypertension: BP is slightly elevated for reduced EF. Will monitor as he diureses.   6. CKD 3-4  7. St jude pacer, VVI    LOS: 4 days   Time spent with pt. :15 minutes. Nada Boozer  Nurse Practitioner Certified Pager [phone] or after 5pm and on weekends call [phone] 02/16/2016, 7:55 AM  The patient was seen and examined, and I agree with the physical exam, assessment and plan as documented above which has been discussed with L. Ingold NP, with modifications as noted below. As creatinine is rising, will switch to oral Lasix 40 mg daily starting 5/17.  Pacemaker functioning normally when interrogated by Dr. Ladona Ridgel in 01/2016. Echo 01/28/16 showed normalization of LVEF as compared to echo 04/19/14. Has had 2 falls at home in recent past as per daughter. I spoke with eldest daughter today.  He is symptomatically stable with respect to CAD. Poor anticoagulation candidate for atrial fibrillation due to prior h/o GI bleeds. Prefers to f/u in our [place] office due to long drive to [place] as per wife and daughter. He can fu with me going forward.  Prentice Docker, MD, [facility]  02/16/2016 9:30 AM

## 2016-02-16 NOTE — Telephone Encounter (Signed)
02/16/16 daughter left a message that Hospice would be taking over his wound care

## 2016-02-16 NOTE — Consult Note (Signed)
   [facility] Inpatient Consult   02/16/2016  [name] 24-Dec-1927 [id number]  Patient screened for potential [facility] Care Management services. Patient is eligible for [facility] Services, however, medical record reveals patient's discharge plan is for home with Hospice care.[facility] Care Management services not appropriate at this time.  If patient's post hospital needs change please place a [facility] Care Management consult.   For questions please contact:   Alben Spittle. Albertha Ghee, RN, BSN, [facility] [phone]) Business Cell  [phone]) Toll Free Office

## 2016-02-16 NOTE — Care Management (Signed)
Spoke with [name] at [facility]. Patient will be discharged today with Home with Hospice care. Spoke with patient daughter [name] who confirms this is a suitable plan. HP DG and Notes faxed to [facility].

## 2016-02-16 NOTE — Discharge Summary (Signed)
[name], is a 82 y.o. male  DOB [birth year]  MRN [id number].  Admission date:  02/12/2016  Admitting Physician  Levie Heritage, DO  Discharge Date:  02/16/2016   Primary MD  [name]., MD  Recommendations for primary care physician for things to follow:   Monitor BMP, weight and diuretic dose closely. Goal of care is general medical care if declines further for comfort care. Home hospice arranged.   Admission Diagnosis  leg swelling   Discharge Diagnosis  leg swelling     Active Problems:   Essential hypertension   CKD (chronic kidney disease) stage 3, GFR 30-59 ml/min   Diabetes mellitus with hyperglycemia, without long-term current use of insulin (HCC)   Acute on chronic diastolic heart failure (HCC)   Anasarca   Stage 2 skin ulcer of sacral region      Past Medical History  Diagnosis Date  . Respiratory failure (HCC)     secondary to pleural effusion, severe COPD, ongoing tobacco abuse, and congestive heart failure)  . Heart block     with sinus pauses (status post St. Jude pacemaker by Dr. Graciela Husbands)  . Chronic renal insufficiency   . Chronic atrial fibrillation (HCC)     (no Coumadin secondary to GI bleeding)  . Diabetes mellitus   . Coronary artery disease 2009    last catheterization in 2009 demonstrate a 3-vessel CAD . He underwent stenting with a right  coronary artery with a  PROMUS drug-eluting stent. He  also had a stenting of his circumflex with drug-eluting  stent previously))  . Cardiomyopathy     (EF 40%)  . Degenerative joint disease   . Hypertension   . Hyperlipidemia   . Peptic ulcer disease   . Peripheral vascular disease (HCC)   . Sinoatrial node dysfunction [facility])     Past Surgical History  Procedure Laterality Date  . Cholecystectomy    . Insert / replace /  remove pacemaker      St. Jude       HPI  from the history and physical done on the day of admission:    Patient is a 82 y.o. male with numerous recent hospitalizations-history of COPD, chronic diastolic heart failure, A. 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 Found to have acute diastolic heart failure and admitted for further evaluation and treatment.     Hospital Course:     Acute on chronic diastolic heart failure (TTE 4/27 EF 55-60%): Continue IV Lasix, still volume overloaded with now 1+ pitting edema. Negative balance of 8.2 L, Was seen by cardiology, per cardiology switched to Lasix 40 mg oral daily and discharge home. Patient and family now leaning towards home hospice which will be arranged, goal of care would be gentle medical treatment if declines further full comfort care.   COPD: At baseline, no wheezing, continue bronchodilators, he is on chronic prednisone  5 mg daily which is being continued.  Hypertension: Controlled, continue B blocker, lasix & Imdur.  Moderate pulmonary hypertension (PA pressure 49 mmHg by TTE on 4/27): Likely secondary to chronic diastolic heart failure/COPD. Stable for outpatient follow-up  Chronic kidney disease stage IV: Creatinine close to baseline of 2   History of atrial fibrillation:chads2vasc of at least 4-not on chronic anticoagulation due to prior history of GI bleeding. Continue bisoprolol.  History of symptomatic bradycardia-with permanent pacemaker insertion  Lower extremity ulceration: Wound care evaluation pending  Deconditioning: PT evaluation pending, appears very frail and deconditioned-with recurrent hospitalization-may require SNF placement.  Mild hyponatremia. Due to fluid overload. Improved with Lasix.  Type 2 diabetes: Hold metformin due to age and CKD  IV, continue other PO meds.   Follow UP  Follow-up Information    Follow up with [name]., MD. Schedule an appointment as soon as possible for a visit in 1 week.   Specialty:  Internal Medicine   Contact information:   [address] [place] [address] [phone]       Follow up with Prentice Docker, MD. Schedule an appointment as soon as possible for a visit in 1 week.   Specialty:  Cardiology   Contact information:   [address] [place] [place] [address] [phone]        Consults obtained - Cards  Discharge Condition: Fair  Diet and Activity recommendation: See Discharge Instructions below  Discharge Instructions       Discharge Instructions    Discharge instructions    Complete by:  As directed   Follow with Primary MD [name]., MD in 7 days   Get CBC, CMP, 2 view Chest X ray checked  by Primary MD next visit.    Activity: As tolerated with Full fall precautions use walker/cane & assistance as needed   Disposition Home     Diet:   Heart Healthy Low Carb, with feeding assistance and aspiration precautions.  Check your Weight same time everyday, if you gain over 2 pounds, or you develop in leg swelling, experience more shortness of breath or chest pain, call your Primary MD immediately. Follow Cardiac Low Salt Diet and 1.5 lit/day fluid restriction.   On your next visit with your primary care physician please Get Medicines reviewed and adjusted.   Please request your Prim.MD to go over all Hospital Tests and Procedure/Radiological results at the follow up, please get all Hospital records sent to your Prim MD by signing hospital release before you go home.   If you experience worsening of your admission symptoms, develop shortness of breath, life threatening emergency, suicidal or homicidal thoughts you must seek medical attention immediately by calling 911 or calling your MD immediately  if symptoms less severe.  You Must  read complete instructions/literature along with all the possible adverse reactions/side effects for all the Medicines you take and that have been prescribed to you. Take any new Medicines after you have completely understood and accpet all the possible adverse reactions/side effects.   Do not drive, operating heavy machinery, perform activities at heights, swimming or participation in water activities or provide baby sitting services if your were admitted for syncope or siezures until you have seen by Primary MD or a Neurologist and advised to do so again.  Do not drive when taking Pain medications.    Do not take more than prescribed Pain, Sleep and Anxiety Medications  Special Instructions: If you have smoked or chewed Tobacco  in the last 2 yrs please stop smoking, stop any regular Alcohol  and or any Recreational drug use.  Wear Seat belts while driving.   Please note  You were cared for by a hospitalist during your hospital stay. If you have any questions about your discharge medications or the care you received while you were in the hospital after you are discharged, you can call the unit and asked to speak with the hospitalist on call if the hospitalist that took care of you is not available. Once you are discharged, your primary care physician will handle any further medical issues. Please note that NO REFILLS for any discharge medications will be authorized once you are discharged, as it is imperative that you return to your primary care physician (or establish a relationship with a primary care physician if you do not have one) for your aftercare needs so that they can reassess your need for medications and monitor your lab values.     Increase activity slowly    Complete by:  As directed              Discharge Medications       Medication List    STOP taking these medications        metFORMIN 500 MG tablet  Commonly known as:  GLUCOPHAGE      TAKE these medications          aspirin 81 MG tablet  Take 81 mg by mouth daily.     ezetimibe 10 MG tablet  Commonly known as:  ZETIA  Take 10 mg by mouth daily.     furosemide 40 MG tablet  Commonly known as:  LASIX  Take 1 tablet (40 mg total) by mouth daily.     glyBURIDE 5 MG tablet  Commonly known as:  DIABETA  Take 5 mg by mouth 3 (three) times daily.     GLYSET 100 MG tablet  Generic drug:  miglitol  Take 1 tablet by mouth 3 (three) times daily with meals.     guaiFENesin 600 MG 12 hr tablet  Commonly known as:  MUCINEX  Take 1 tablet (600 mg total) by mouth 2 (two) times daily.     ipratropium-albuterol 0.5-2.5 (3) MG/3ML Soln  Commonly known as:  DUONEB  Take 3 mLs by nebulization every 6 (six) hours as needed.     isosorbide mononitrate 30 MG 24 hr tablet  Commonly known as:  IMDUR  Take 30 mg by mouth 2 (two) times daily.     levalbuterol 45 MCG/ACT inhaler  Commonly known as:  XOPENEX HFA  Inhale 1 puff into the lungs every 6 (six) hours as needed for wheezing.     LORazepam 2 MG/ML concentrated solution  Commonly known as:  ATIVAN  Take 0.5 mLs (1 mg total) by mouth every 6 (six) hours as needed for anxiety.     morphine CONCENTRATE 10 MG/0.5ML Soln concentrated solution  Take 0.5 mLs (10 mg total) by mouth every 3 (three) hours as needed for moderate pain or severe pain.     predniSONE 5 MG tablet  Commonly known as:  DELTASONE  Take 1 tablet (5 mg total) by mouth daily with breakfast.        Major procedures and Radiology Reports - PLEASE review detailed and final reports for all details, in brief -       Dg Chest 2 View  02/12/2016  CLINICAL DATA:  82 y/o male with c/o dyspnea, general  pelvic pain, and bilateral leg swelling. CXR and pelvis ordered. Pt very SOB during exam. Hx HTN, DM, smoker, a-fib, CAD, respiratory failure, cardiomyopathy, pacemaker EXAM: CHEST  2 VIEW COMPARISON:  01/27/2016 FINDINGS: Left-sided transvenous pacemaker lead to the right ventricle.  Patient is slightly rotated towards the left. The heart is mildly enlarged. There are no focal consolidations. Small pleural effusions are present. No definite pulmonary edema. Chronic changes in both shoulders and spine. IMPRESSION: 1. Mild cardiomegaly. 2. Small bilateral effusions. Electronically Signed   By: Norva Pavlov M.D.   On: 02/12/2016 18:58   Dg Pelvis 1-2 Views  02/12/2016  CLINICAL DATA:  General pelvic pain and bilateral leg swelling. EXAM: PELVIS - 1-2 VIEW COMPARISON:  02/18/2009 FINDINGS: Exam demonstrates diffuse decreased bone mineralization. There are mild symmetric degenerative changes of the hips after stable. No evidence of fracture or dislocation. Degenerative change of the spine. Vascular calcifications are present. IMPRESSION: No acute findings per Degenerative change of the spine and hips. Electronically Signed   By: Elberta Fortis M.D.   On: 02/12/2016 18:59   Dg Chest Port 1 View  01/27/2016  CLINICAL DATA:  Shortness of breath for 1 year, worsening over the last week. EXAM: PORTABLE CHEST 1 VIEW COMPARISON:  01/19/2016. FINDINGS: Trachea is midline. Heart size stable. Thoracic aorta is calcified. Left subclavian pacemaker lead tip projects over the right ventricle. There may be mild volume loss at the left costophrenic angle. Lungs are otherwise clear. No pleural fluid. IMPRESSION: No acute findings. Electronically Signed   By: Leanna Battles M.D.   On: 01/27/2016 15:27   Dg Chest Portable 1 View  01/20/2016  CLINICAL DATA:  Shortness of breath and wheezing for 3 days. Bilateral leg swelling. Productive cough. EXAM: PORTABLE CHEST 1 VIEW COMPARISON:  04/18/2014 FINDINGS: Cardiac pacemaker. Mild cardiac enlargement without significant pulmonary vascular congestion. No focal airspace disease or consolidation. No blunting of costophrenic angles. No pneumothorax. Calcified and tortuous aorta. Degenerative changes in the shoulders. IMPRESSION: Mild cardiac enlargement.  No  evidence of active pulmonary disease. Electronically Signed   By: Burman Nieves M.D.   On: 01/20/2016 00:09    Micro Results      No results found for this or any previous visit (from the past 240 hour(s)).     Today   Subjective    [name] today has no headache,no chest abdominal pain,no new weakness tingling or numbness, feels much better wants to go home today.     Objective   Blood pressure 121/50, pulse 59, temperature 97.5 F (36.4 C), temperature source Oral, resp. rate 20, height 5\' 10"  (1.778 m), weight 75.5 kg (166 lb 7.2 oz), SpO2 100 %.   Intake/Output Summary (Last 24 hours) at 02/16/16 0730 Last data filed at 02/16/16 0516  Gross per 24 hour  Intake    180 ml  Output   2050 ml  Net  -1870 ml    Exam Awake Alert, Oriented x 3, No new F.N deficits, Normal affect [place].AT,PERRAL Supple Neck,No JVD, No cervical lymphadenopathy appriciated.  Symmetrical Chest wall movement, Good air movement bilaterally, CTAB RRR,No Gallops,Rubs or new Murmurs, No Parasternal Heave +ve B.Sounds, Abd Soft, Non tender, No organomegaly appriciated, No rebound -guarding or rigidity. No Cyanosis, Clubbing , 1+ leg edema, No new Rash or bruise   Data Review   CBC w Diff: Lab Results  Component Value Date   WBC 12.4* 02/12/2016   HGB 12.0* 02/12/2016   HCT 36.3* 02/12/2016   PLT  150 02/12/2016   LYMPHOPCT 11 02/12/2016   MONOPCT 7 02/12/2016   EOSPCT 3 02/12/2016   BASOPCT 0 02/12/2016    CMP: Lab Results  Component Value Date   NA 133* 02/16/2016   K 4.8 02/16/2016   CL 101 02/16/2016   CO2 24 02/16/2016   BUN 64* 02/16/2016   CREATININE 2.13* 02/16/2016   PROT 6.1* 02/12/2016   ALBUMIN 3.4* 02/12/2016   BILITOT 0.6 02/12/2016   ALKPHOS 63 02/12/2016   AST 17 02/12/2016   ALT 18 02/12/2016  .   Total Time in preparing paper work, data evaluation and todays exam - 35 minutes  Leroy Sea M.D on 02/16/2016 at 7:30 AM  [facility]   Office   [phone]

## 2016-02-16 NOTE — Care Management Note (Signed)
Case Management Note  Patient Details  Name: [name] MRN: [id number] Date of Birth: Jun 11, 1928  Subjective/Objective: Spoke with [name] at [facility]. Patient accepted and Hospice will contact family to arrange delivery of equipment. Patient wil travel by EMS to home when equipment is in place. Patient will need transport with O2.                    Action/Plan: Home with Hospice.   Expected Discharge Date:                  Expected Discharge Plan:  Home w Hospice Care  In-House Referral:  Hospice / Palliative Care  Discharge planning Services  CM Consult  Post Acute Care Choice:    Choice offered to:     DME Arranged:    DME Agency:     HH Arranged:    HH Agency:  [facility]  Status of Service:  Completed, signed off  Medicare Important Message Given:  Yes Date Medicare IM Given:    Medicare IM give by:    Date Additional Medicare IM Given:    Additional Medicare Important Message give by:     If discussed at Long Length of Stay Meetings, dates discussed:    Additional Comments:  Adonis Huguenin, RN 02/16/2016, 1:10 PM

## 2016-02-16 NOTE — Discharge Instructions (Signed)
Follow with Primary MD [name]., MD in 7 days   Get CBC, CMP, 2 view Chest X ray checked  by Primary MD next visit.    Activity: As tolerated with Full fall precautions use walker/cane & assistance as needed   Disposition Home     Diet:   Heart Healthy Low Carb, with feeding assistance and aspiration precautions.  Check your Weight same time everyday, if you gain over 2 pounds, or you develop in leg swelling, experience more shortness of breath or chest pain, call your Primary MD immediately. Follow Cardiac Low Salt Diet and 1.5 lit/day fluid restriction.   On your next visit with your primary care physician please Get Medicines reviewed and adjusted.   Please request your Prim.MD to go over all Hospital Tests and Procedure/Radiological results at the follow up, please get all Hospital records sent to your Prim MD by signing hospital release before you go home.   If you experience worsening of your admission symptoms, develop shortness of breath, life threatening emergency, suicidal or homicidal thoughts you must seek medical attention immediately by calling 911 or calling your MD immediately  if symptoms less severe.  You Must read complete instructions/literature along with all the possible adverse reactions/side effects for all the Medicines you take and that have been prescribed to you. Take any new Medicines after you have completely understood and accpet all the possible adverse reactions/side effects.   Do not drive, operating heavy machinery, perform activities at heights, swimming or participation in water activities or provide baby sitting services if your were admitted for syncope or siezures until you have seen by Primary MD or a Neurologist and advised to do so again.  Do not drive when taking Pain medications.    Do not take more than prescribed Pain, Sleep and Anxiety Medications  Special Instructions: If you have smoked or chewed Tobacco  in the last 2 yrs  please stop smoking, stop any regular Alcohol  and or any Recreational drug use.  Wear Seat belts while driving.   Please note  You were cared for by a hospitalist during your hospital stay. If you have any questions about your discharge medications or the care you received while you were in the hospital after you are discharged, you can call the unit and asked to speak with the hospitalist on call if the hospitalist that took care of you is not available. Once you are discharged, your primary care physician will handle any further medical issues. Please note that NO REFILLS for any discharge medications will be authorized once you are discharged, as it is imperative that you return to your primary care physician (or establish a relationship with a primary care physician if you do not have one) for your aftercare needs so that they can reassess your need for medications and monitor your lab values.  Edema Edema is an abnormal buildup of fluids. It is more common in your legs and thighs. Painless swelling of the feet and ankles is more likely as a person ages. It also is common in looser skin, like around your eyes. HOME CARE   Keep the affected body part above the level of the heart while lying down.  Do not sit still or stand for a long time.  Do not put anything right under your knees when you lie down.  Do not wear tight clothes on your upper legs.  Exercise your legs to help the puffiness (swelling) go down.  Wear elastic bandages  or support stockings as told by your doctor.  A low-salt diet may help lessen the puffiness.  Only take medicine as told by your doctor. GET HELP IF:  Treatment is not working.  You have heart, liver, or kidney disease and notice that your skin looks puffy or shiny.  You have puffiness in your legs that does not get better when you raise your legs.  You have sudden weight gain for no reason. GET HELP RIGHT AWAY IF:   You have shortness of breath  or chest pain.  You cannot breathe when you lie down.  You have pain, redness, or warmth in the areas that are puffy.  You have heart, liver, or kidney disease and get edema all of a sudden.  You have a fever and your symptoms get worse all of a sudden. MAKE SURE YOU:   Understand these instructions.  Will watch your condition.  Will get help right away if you are not doing well or get worse.   This information is not intended to replace advice given to you by your health care provider. Make sure you discuss any questions you have with your health care provider.   Document Released: 03/07/2008 Document Revised: 09/24/2013 Document Reviewed: 07/12/2013 Elsevier Interactive Patient Education [employer]

## 2016-02-17 ENCOUNTER — Ambulatory Visit (HOSPITAL_COMMUNITY): Payer: Medicare Other | Admitting: Physical Therapy

## 2016-02-22 ENCOUNTER — Ambulatory Visit (HOSPITAL_COMMUNITY): Payer: Medicare Other | Admitting: Physical Therapy

## 2016-02-24 ENCOUNTER — Ambulatory Visit (HOSPITAL_COMMUNITY): Payer: Medicare Other | Admitting: Physical Therapy

## 2016-03-01 ENCOUNTER — Ambulatory Visit (HOSPITAL_COMMUNITY): Payer: Medicare Other | Admitting: Physical Therapy

## 2016-03-01 ENCOUNTER — Encounter: Payer: Medicare Other | Admitting: Adult Health

## 2016-03-03 ENCOUNTER — Ambulatory Visit (HOSPITAL_COMMUNITY): Payer: Medicare Other | Admitting: Physical Therapy

## 2016-03-03 DEATH — deceased

## 2018-06-08 ENCOUNTER — Encounter (HOSPITAL_COMMUNITY): Payer: Self-pay | Admitting: Physical Therapy

## 2018-06-08 NOTE — Therapy (Signed)
[place], [place], [address] Phone: [phone]   Fax:  [phone]  Patient Details  Name: [name] MRN: [id number] Date of Birth: 1928/07/03 Referring Provider:  No ref. provider found  Encounter Date: 06/08/2018    PHYSICAL THERAPY DISCHARGE SUMMARY  Visits from Start of Care: 9  Current functional level related to goals / functional outcomes: Continued to have wound when pt quit coming to therapy    Remaining deficits: Open non healing wound   Education / Equipment: Self care  Plan: Patient agrees to discharge.  Patient goals were partially met. Patient is being discharged due to being pleased with the current functional level.  ?????       Rayetta Humphrey, PT CLT [phone] 06/08/2018, 10:33 AM  [place], [place], [address] Phone: [phone]   Fax:  [phone]
